# Patient Record
Sex: Male | Born: 1955 | Race: White | Hispanic: No | Marital: Married | Smoking: Never smoker
Health system: Southern US, Community
[De-identification: ages and names within clinical notes are randomized; demographics above are authoritative.]

## PROBLEM LIST (undated history)

## (undated) DIAGNOSIS — I10 Essential (primary) hypertension: Secondary | ICD-10-CM

## (undated) DIAGNOSIS — R079 Chest pain, unspecified: Secondary | ICD-10-CM

## (undated) DIAGNOSIS — I2510 Atherosclerotic heart disease of native coronary artery without angina pectoris: Secondary | ICD-10-CM

## (undated) DIAGNOSIS — M1990 Unspecified osteoarthritis, unspecified site: Secondary | ICD-10-CM

## (undated) DIAGNOSIS — I214 Non-ST elevation (NSTEMI) myocardial infarction: Secondary | ICD-10-CM

## (undated) DIAGNOSIS — K219 Gastro-esophageal reflux disease without esophagitis: Secondary | ICD-10-CM

## (undated) DIAGNOSIS — E7800 Pure hypercholesterolemia, unspecified: Secondary | ICD-10-CM

## (undated) HISTORY — PX: SHOULDER HEMI-ARTHROPLASTY: SHX5049

## (undated) HISTORY — DX: Chest pain, unspecified: R07.9

## (undated) HISTORY — DX: Gastro-esophageal reflux disease without esophagitis: K21.9

## (undated) HISTORY — PX: CARDIAC CATHETERIZATION: SHX172

---

## 2007-08-17 ENCOUNTER — Encounter: Admission: RE | Admit: 2007-08-17 | Discharge: 2007-08-17 | Payer: Self-pay | Admitting: Orthopedic Surgery

## 2010-02-18 ENCOUNTER — Encounter: Payer: Self-pay | Admitting: Orthopedic Surgery

## 2012-03-22 ENCOUNTER — Encounter (HOSPITAL_COMMUNITY): Payer: Self-pay | Admitting: *Deleted

## 2012-03-22 ENCOUNTER — Emergency Department (HOSPITAL_COMMUNITY): Payer: 59

## 2012-03-22 ENCOUNTER — Emergency Department (HOSPITAL_COMMUNITY)
Admission: EM | Admit: 2012-03-22 | Discharge: 2012-03-22 | Disposition: A | Discharge status: Home / Self Care | Payer: 59 | Attending: Emergency Medicine | Admitting: Emergency Medicine

## 2012-03-22 DIAGNOSIS — Z8639 Personal history of other endocrine, nutritional and metabolic disease: Secondary | ICD-10-CM | POA: Insufficient documentation

## 2012-03-22 DIAGNOSIS — Z8739 Personal history of other diseases of the musculoskeletal system and connective tissue: Secondary | ICD-10-CM | POA: Insufficient documentation

## 2012-03-22 DIAGNOSIS — R6889 Other general symptoms and signs: Secondary | ICD-10-CM | POA: Insufficient documentation

## 2012-03-22 DIAGNOSIS — R1310 Dysphagia, unspecified: Secondary | ICD-10-CM | POA: Insufficient documentation

## 2012-03-22 DIAGNOSIS — I10 Essential (primary) hypertension: Secondary | ICD-10-CM | POA: Insufficient documentation

## 2012-03-22 DIAGNOSIS — Z862 Personal history of diseases of the blood and blood-forming organs and certain disorders involving the immune mechanism: Secondary | ICD-10-CM | POA: Insufficient documentation

## 2012-03-22 DIAGNOSIS — R112 Nausea with vomiting, unspecified: Secondary | ICD-10-CM | POA: Insufficient documentation

## 2012-03-22 HISTORY — DX: Essential (primary) hypertension: I10

## 2012-03-22 HISTORY — DX: Pure hypercholesterolemia, unspecified: E78.00

## 2012-03-22 HISTORY — DX: Unspecified osteoarthritis, unspecified site: M19.90

## 2012-03-22 MED ORDER — GI COCKTAIL ~~LOC~~
30.0000 mL | Freq: Once | ORAL | Status: AC
  Administered 2012-03-22: 30 mL via ORAL
  Filled 2012-03-22: qty 30

## 2012-03-22 MED ORDER — GLUCAGON HCL (RDNA) 1 MG IJ SOLR
1.0000 mg | Freq: Once | INTRAMUSCULAR | Status: AC
  Administered 2012-03-22: 1 mg via INTRAMUSCULAR
  Filled 2012-03-22: qty 1

## 2012-03-22 NOTE — ED Notes (Signed)
MD at bedside. 

## 2012-03-22 NOTE — ED Notes (Signed)
Pt able to drink water and eat crackers with no further vomiting reported. Pt reports that he feels better but continues to have a burning sensation in chest from vomiting, Dr. Silverio Lay also aware.

## 2012-03-22 NOTE — ED Provider Notes (Signed)
History     CSN: [id number]  Arrival date & time 03/22/12  0830   First MD Initiated Contact with Patient 03/22/12 [phone]      Chief Complaint  Patient presents with  . Dysphagia  . Emesis    (Consider location/radiation/quality/duration/timing/severity/associated sxs/prior treatment) The history is provided by the patient.  Logan Wells is a 57 y.o. male here with food stuck in esophagus. He was eating toast and eggs and bacon this morning and suddenly felt that some food was stuck in his lower esophagus and then felt nauseous and vomited. Unable to tolerate by mouth afterwards including water. Denies any shortness of breath or wheezing. Denies any abdominal pain. Denies eating anything with bones in it.    Past Medical History  Diagnosis Date  . Hypertension   . High cholesterol   . Arthritis     Past Surgical History  Procedure Laterality Date  . Shoulder surgery      Left    History reviewed. No pertinent family history.  History  Substance Use Topics  . Smoking status: Never Smoker   . Smokeless tobacco: Never Used  . Alcohol Use: 1.2 oz/week    2 Glasses of wine per week     Comment: daily      Review of Systems  Gastrointestinal: Positive for vomiting.  All other systems reviewed and are negative.    Allergies  Review of patient's allergies indicates no known allergies.  Home Medications  No current outpatient prescriptions on file.  BP 165/99  Pulse 82  Resp 16  SpO2 96%  Physical Exam  Nursing note and vitals reviewed. Constitutional: He is oriented to person, place, and time. He appears well-developed and well-nourished.  HENT:  Head: Normocephalic.  Mouth/Throat: Oropharynx is clear and moist.  No visualized foreign body in OP   Eyes: Conjunctivae are normal. Pupils are equal, round, and reactive to light.  Neck: Normal range of motion. Neck supple.  Cardiovascular: Normal rate, regular rhythm and normal heart sounds.    Pulmonary/Chest: Effort normal and breath sounds normal. No respiratory distress. He has no wheezes. He has no rales.  Abdominal: Soft. Bowel sounds are normal. He exhibits no distension. There is no tenderness. There is no rebound.  Musculoskeletal: Normal range of motion.  Neurological: He is alert and oriented to person, place, and time.  Skin: Skin is warm and dry.  Psychiatric: He has a normal mood and affect. His behavior is normal. Judgment and thought content normal.    ED Course  Procedures (including critical care time)  Labs Reviewed - No data to display Dg Chest 2 View  03/22/2012  *RADIOLOGY REPORT*  Clinical Data: Food stuck in esophagus.  CHEST - 2 VIEW  Comparison: 02/04/2008  Findings: Heart and mediastinal contours are within normal limits. No focal opacities or effusions.  No acute bony abnormality.  IMPRESSION: No active cardiopulmonary disease.   Original Report Authenticated By: Charlett Nose, M.D.      No diagnosis found.    MDM  Montre Harbor is a 57 y.o. male here with possible food bolus in esophagus. Will get xray. Will give glucagon IM to try and alleviate symptoms. Will PO trial afterwards.   10:41 AM Felt better after glucagon and GI cocktail. Able to drink fluids and eat crackers. Likely a food bolus that was stuck and now passed into the stomach. D/c home. Return precautions given.         Richardean Canal, MD 03/22/12 [phone]

## 2012-03-22 NOTE — ED Notes (Signed)
Patient transported to X-ray 

## 2012-03-22 NOTE — ED Notes (Signed)
Pt from home with reports of dysphagia that started this morning around 0800. Pt endorses inability to keep food or drink down with pain. Pt reports that it feels as if food gets stuck and will not pass a certain area in upper chest.

## 2012-05-28 ENCOUNTER — Other Ambulatory Visit: Payer: Self-pay | Admitting: Family Medicine

## 2012-05-28 DIAGNOSIS — R1310 Dysphagia, unspecified: Secondary | ICD-10-CM

## 2012-06-04 ENCOUNTER — Ambulatory Visit
Admission: RE | Admit: 2012-06-04 | Discharge: 2012-06-04 | Disposition: A | Discharge status: Home / Self Care | Payer: 59 | Source: Ambulatory Visit | Attending: Family Medicine | Admitting: Family Medicine

## 2012-06-04 DIAGNOSIS — R1310 Dysphagia, unspecified: Secondary | ICD-10-CM

## 2012-11-05 ENCOUNTER — Ambulatory Visit (INDEPENDENT_AMBULATORY_CARE_PROVIDER_SITE_OTHER): Payer: 59

## 2012-11-05 DIAGNOSIS — Z23 Encounter for immunization: Secondary | ICD-10-CM

## 2016-08-26 ENCOUNTER — Ambulatory Visit (INDEPENDENT_AMBULATORY_CARE_PROVIDER_SITE_OTHER): Payer: 59

## 2016-08-26 ENCOUNTER — Ambulatory Visit (INDEPENDENT_AMBULATORY_CARE_PROVIDER_SITE_OTHER): Payer: Self-pay | Admitting: Podiatry

## 2016-08-26 DIAGNOSIS — G5790 Unspecified mononeuropathy of unspecified lower limb: Secondary | ICD-10-CM

## 2016-08-26 DIAGNOSIS — M79671 Pain in right foot: Secondary | ICD-10-CM

## 2016-08-26 DIAGNOSIS — M79672 Pain in left foot: Secondary | ICD-10-CM

## 2016-08-26 NOTE — Progress Notes (Signed)
   Subjective:    Patient ID: Logan Wells, male    DOB: 11/12/1955, 61 y.o.   MRN: [id number]  HPI: He presents today as a chief complaint of pain and burning to the lateral aspect of the foot bilaterally and painful radiating pain along the medial aspect of the foot shooting out the distal aspect of the hallux bilaterally. He states that this is been going on now for the past 2 months. He is an avid walker and gets in 5000 steps first thing in the mornings. He states that he alternates issues daily including his shoes that he wears while he is exercising. He also alternates machines from treadmill to elliptical. He is also noting the only changes in his medical history was increase in his Lipitor approximately 3 months ago. He denies any aches and pains in his muscles and joints.    Review of Systems  All other systems reviewed and are negative.      Objective:   Physical Exam: Vital signs are stable alert and oriented 3. Pulses are palpable. Neurologic sensorium is intact per Semmes-Weinstein monofilaments and deep tendon reflexes are intact bilaterally symmetrical. Muscle strength +5 over 5 dorsiflexion plantar flexors and inverters everters all of his musculature is intact. Orthopedic evaluation of Mr. it's all joints distal to the ankle for range of motion without crepitation. No reproducible pain on palpation. Radiographs do not demonstrate any type of osseus abnormalities.        Assessment & Plan:  Neuritis of unknown etiology. Possibly associated with Lipitor or shoe gear failure.  Plan: I recommended shoe gear change and update. I also recommended if this fails to alleviate his symptoms and I would consider a drug holiday to make sure that he is not having side effects of the Lipitor. Neuropathic symptoms are not often seen with this but I have had one other case with this.

## 2016-11-18 ENCOUNTER — Telehealth: Payer: Self-pay | Admitting: Nurse Practitioner

## 2016-11-18 NOTE — Telephone Encounter (Signed)
Received call from Dr. Sigmund HazelLisa Miller at [facility] for Dr. Elease HashimotoNahser, DOD. She called about a patient currently in her office who is experiencing chest pain with exertion. Per Dr. Elease HashimotoNahser, Dr. Hyacinth MeekerMiller reports EKG is not acute. He has not been seen by cardiology before. Dr. Elease HashimotoNahser advised her to prescribe SL NTG and that patient can be seen by one of our providers tomorrow. I was able to find an opening on Dr. Norris Crossurner's schedule for tomorrow 10/24 for new patient and Dr. Rondel BatonMiller's office states they will give patient the information.

## 2016-11-19 ENCOUNTER — Ambulatory Visit (INDEPENDENT_AMBULATORY_CARE_PROVIDER_SITE_OTHER): Payer: 59 | Admitting: Cardiology

## 2016-11-19 ENCOUNTER — Encounter: Payer: Self-pay | Admitting: Cardiology

## 2016-11-19 DIAGNOSIS — R079 Chest pain, unspecified: Secondary | ICD-10-CM

## 2016-11-19 DIAGNOSIS — K219 Gastro-esophageal reflux disease without esophagitis: Secondary | ICD-10-CM | POA: Insufficient documentation

## 2016-11-19 DIAGNOSIS — I200 Unstable angina: Secondary | ICD-10-CM | POA: Insufficient documentation

## 2016-11-19 HISTORY — DX: Chest pain, unspecified: R07.9

## 2016-11-19 NOTE — Patient Instructions (Addendum)
Your physician recommends that you continue on your current medications as directed. Please refer to the Current Medication list given to you today.  Your physician has requested that you have an echocardiogram. Echocardiography is a painless test that uses sound waves to create images of your heart. It provides your doctor with information about the size and shape of your heart and how well your heart's chambers and valves are working. This procedure takes approximately one hour. There are no restrictions for this procedure.  Your physician has requested that you have an exercise tolerance test. For further information please visit [email or web address]. Please also follow instruction sheet, as given.  Your physician recommends that you schedule a follow-up appointment in: as needed with Dr. Mayford Knifeurner.

## 2016-11-19 NOTE — Progress Notes (Deleted)
  Cardiology Office Note:    Date:  11/19/2016   ID:  Logan Wells, DOB 08-12-1955, MRN [id number]  PCP:  Sigmund HazelMiller, Lisa, MD  Cardiologist:  Armanda Magicraci Turner, MD   Referring MD: Sigmund HazelMiller, Lisa, MD   No chief complaint on file.   History of Present Illness:    Logan Wells is a 61 y.o. male with a hx of ***  Past Medical History:  Diagnosis Date  . Arthritis   . High cholesterol   . Hypertension     Past Surgical History:  Procedure Laterality Date  . SHOULDER SURGERY     Left    Current Medications: No outpatient prescriptions have been marked as taking for the 11/19/16 encounter (Appointment) with Quintella Reicherturner, Traci R, MD.     Allergies:   Patient has no known allergies.   Social History   Social History  . Marital status: Unknown    Spouse name: N/A  . Number of children: N/A  . Years of education: N/A   Social History Main Topics  . Smoking status: Never Smoker  . Smokeless tobacco: Never Used  . Alcohol use 1.2 oz/week    2 Glasses of wine per week     Comment: daily  . Drug use: No  . Sexual activity: Not on file   Other Topics Concern  . Not on file   Social History Narrative  . No narrative on file     Family History: The patient's ***family history is not on file.  ROS:   Please see the history of present illness.    ROS  All other systems reviewed and negative.   EKGs/Labs/Other Studies Reviewed:    The following studies were reviewed today: ***  EKG:  EKG is *** ordered today.  The ekg ordered today demonstrates ***  Recent Labs: No results found for requested labs within last 8760 hours.   Recent Lipid Panel No results found for: CHOL, TRIG, HDL, CHOLHDL, VLDL, LDLCALC, LDLDIRECT  Physical Exam:    VS:  There were no vitals taken for this visit.    Wt Readings from Last 3 Encounters:  No data found for Wt     GEN: *** Well nourished, well developed in no acute distress HEENT: Normal NECK: No JVD; No carotid bruits LYMPHATICS: No  lymphadenopathy CARDIAC: ***RRR, no murmurs, rubs, gallops RESPIRATORY:  Clear to auscultation without rales, wheezing or rhonchi  ABDOMEN: Soft, non-tender, non-distended MUSCULOSKELETAL:  No edema; No deformity  SKIN: Warm and dry NEUROLOGIC:  Alert and oriented x 3 PSYCHIATRIC:  Normal affect   ASSESSMENT:    No diagnosis found. PLAN:    In order of problems listed above:  ***   Medication Adjustments/Labs and Tests Ordered: Current medicines are reviewed at length with the patient today.  Concerns regarding medicines are outlined above.  No orders of the defined types were placed in this encounter.  No orders of the defined types were placed in this encounter.   Signed, Armanda Magicraci Turner, MD  11/19/2016 8:30 AM    [facility]

## 2016-11-19 NOTE — Progress Notes (Signed)
Cardiology Office Note    Date:  11/19/2016   ID:  Logan Wells, DOB 02/21/1955, MRN [id number]  PCP:  Kathyrn Lass, MD  Cardiologist:  Fransico Him, MD   Chief Complaint  Patient presents with  . Chest Pain    History of Present Illness:  Logan Wells is a 61 y.o. male who is being seen today for the evaluation of Chest pain at the request of Kathyrn Lass, MD.    This is a 61yo male with a history of hyperlipidemia, HTN and GERD who presents today for evaluation of CP.  He is very active and works out at least 5 days weekly.  In the past he has never had any problems but recently he has had problems with chest pain when walking on the treadmill.  He went to [place] this weekend and went hiking and had onset of CP again that resolved with rest.  The pain is midsternal with no radiation and is not associated with nausea or diaphoresis.  He denies any SOB, DOE, LE edema, palpitations or syncope.   Past Medical History:  Diagnosis Date  . Arthritis   . Chest pain 11/19/2016  . GERD (gastroesophageal reflux disease)   . High cholesterol   . Hypertension     Past Surgical History:  Procedure Laterality Date  . SHOULDER SURGERY     Left    Current Medications: Current Meds  Medication Sig  . esomeprazole (NEXIUM) 10 MG packet Take 10 mg by mouth daily before breakfast.  . losartan (COZAAR) 50 MG tablet Take 50 mg by mouth.  . nitroGLYCERIN (NITROSTAT) 0.4 MG SL tablet Place 0.4 mg under the tongue every 5 (five) minutes as needed for chest pain.  . simvastatin (ZOCOR) 40 MG tablet Take 40 mg by mouth daily at 6 PM.     Allergies:   Patient has no known allergies.   Social History   Social History  . Marital status: Unknown    Spouse name: N/A  . Number of children: N/A  . Years of education: N/A   Social History Main Topics  . Smoking status: Never Smoker  . Smokeless tobacco: Never Used  . Alcohol use 1.2 oz/week    2 Glasses of wine per week     Comment:  daily  . Drug use: No  . Sexual activity: Not Asked   Other Topics Concern  . None   Social History Narrative  . None     Family History:  The patient's family history includes CAD in his mother; Heart attack (age of onset: 41) in his mother; Heart disease in his mother; Multiple myeloma in his father.   ROS:   Please see the history of present illness.    ROS All other systems reviewed and are negative.  No flowsheet data found.     PHYSICAL EXAM:   VS:  BP 140/88   Pulse 87   Ht 5' 10"  (1.778 m)   Wt 178 lb (80.7 kg)   SpO2 96%   BMI 25.54 kg/m    GEN: Well nourished, well developed, in no acute distress  HEENT: normal  Neck: no JVD, carotid bruits, or masses Cardiac:RRR; no murmurs, rubs, or gallops,no edema.  Intact distal pulses bilaterally.  Respiratory:  clear to auscultation bilaterally, normal work of breathing GI: soft, nontender, nondistended, + BS MS: no deformity or atrophy  Skin: warm and dry, no rash Neuro:  Alert and Oriented x 3, Strength and sensation are intact  Psych: euthymic mood, full affect  Wt Readings from Last 3 Encounters:  11/19/16 178 lb (80.7 kg)      Studies/Labs Reviewed:   EKG:  EKG isnot ordered today.    Recent Labs: No results found for requested labs within last 8760 hours.   Lipid Panel No results found for: CHOL, TRIG, HDL, CHOLHDL, VLDL, LDLCALC, LDLDIRECT  Additional studies/ records that were reviewed today include:  Office notes from PCP    ASSESSMENT:    1. Chest pain, unspecified type      PLAN:  In order of problems listed above:  1. Chest pain - I am concerned that this may be related to CAD.  He has been able to do the same workout at the gym for years and now is having chest pain on the treadmill.  He has a family history of premature CAD and also has HTN and hyperlipidemia.  I have recommended that we proceed with ETT to rule out ischemia and 2D echo to assess LVF.     Medication  Adjustments/Labs and Tests Ordered: Current medicines are reviewed at length with the patient today.  Concerns regarding medicines are outlined above.  Medication changes, Labs and Tests ordered today are listed in the Patient Instructions below.  Patient Instructions  Your physician recommends that you continue on your current medications as directed. Please refer to the Current Medication list given to you today.  Your physician has requested that you have an echocardiogram. Echocardiography is a painless test that uses sound waves to create images of your heart. It provides your doctor with information about the size and shape of your heart and how well your heart's chambers and valves are working. This procedure takes approximately one hour. There are no restrictions for this procedure.  Your physician has requested that you have an exercise tolerance test. For further information please visit [email or web address]. Please also follow instruction sheet, as given.      Signed, Fransico Him, MD  11/19/2016 8:53 AM    [address], [place], [place]  [address] Phone: [phone]; Fax: [phone]

## 2016-11-26 ENCOUNTER — Telehealth: Payer: Self-pay | Admitting: Cardiology

## 2016-11-26 ENCOUNTER — Ambulatory Visit (INDEPENDENT_AMBULATORY_CARE_PROVIDER_SITE_OTHER): Payer: 59

## 2016-11-26 ENCOUNTER — Other Ambulatory Visit: Payer: Self-pay

## 2016-11-26 ENCOUNTER — Ambulatory Visit (HOSPITAL_COMMUNITY): Payer: 59 | Attending: Cardiovascular Disease

## 2016-11-26 DIAGNOSIS — R079 Chest pain, unspecified: Secondary | ICD-10-CM

## 2016-11-26 DIAGNOSIS — E785 Hyperlipidemia, unspecified: Secondary | ICD-10-CM | POA: Insufficient documentation

## 2016-11-26 DIAGNOSIS — I10 Essential (primary) hypertension: Secondary | ICD-10-CM | POA: Insufficient documentation

## 2016-11-26 LAB — EXERCISE TOLERANCE TEST
CHL RATE OF PERCEIVED EXERTION: 15
CSEPED: 8 min
CSEPEDS: 0 s
CSEPEW: 10.1 METS
MPHR: 161 {beats}/min
Peak HR: 151 {beats}/min
Percent HR: 93 %
Rest HR: 72 {beats}/min

## 2016-11-26 NOTE — Telephone Encounter (Signed)
Spoke to patient stated he was returning a call to [place].Advised I will send message to her.

## 2016-11-26 NOTE — Telephone Encounter (Signed)
New Message ° ° pt verbalized that he is returning call for rn  °

## 2016-11-26 NOTE — Telephone Encounter (Signed)
-----   Message from Quintella Reichertraci R Turner, MD sent at 11/26/2016  3:36 PM EDT ----- His LV global strain is mildly abnormal which could indicate very mild LV dysfunction.  Please change coronary calcium score to coronary CTA with morphology to rule out CAD

## 2016-11-26 NOTE — Telephone Encounter (Signed)
Patient made aware of results. Coronary CT ordered for scheduling. Patient verbalizes understanding.

## 2016-12-02 ENCOUNTER — Encounter: Payer: Self-pay | Admitting: Cardiology

## 2016-12-16 ENCOUNTER — Encounter: Payer: Self-pay | Admitting: Cardiology

## 2016-12-17 ENCOUNTER — Telehealth: Payer: Self-pay

## 2016-12-17 DIAGNOSIS — R079 Chest pain, unspecified: Secondary | ICD-10-CM

## 2016-12-17 NOTE — Progress Notes (Signed)
Letter for coronary CT scan.

## 2016-12-17 NOTE — Telephone Encounter (Signed)
Left message to call back to go over instructions for CT scan and to order BMET to be done prior to scan.

## 2016-12-22 MED ORDER — METOPROLOL TARTRATE 50 MG PO TABS: 50.0000 mg | ORAL_TABLET | Freq: Once | ORAL | 0 refills | Status: DC

## 2016-12-22 NOTE — Telephone Encounter (Signed)
Reviewed CT instructions with patient, patient is scheduled for BMET tomororw 11/27 and instructed patient to take one time dose of lopressor 50 mg 1 hour before the test. Patient in agreement with plan and thanked me for the call. Instructions placed up front to be picked up when he comes in for lab work. Patient verbalized understanding and thanked me for the call.

## 2016-12-22 NOTE — Telephone Encounter (Signed)
F/U Call: ° °Patient returning call for results. °

## 2016-12-22 NOTE — Telephone Encounter (Signed)
Left message to call back to discuss CT scan instructions.

## 2016-12-23 ENCOUNTER — Encounter: Payer: Self-pay | Admitting: Cardiology

## 2016-12-23 ENCOUNTER — Other Ambulatory Visit: Payer: 59

## 2016-12-23 DIAGNOSIS — R079 Chest pain, unspecified: Secondary | ICD-10-CM

## 2016-12-24 LAB — BASIC METABOLIC PANEL
BUN/Creatinine Ratio: 15 (ref 9–20)
BUN: 13 mg/dL (ref 6–24)
CALCIUM: 9.5 mg/dL (ref 8.7–10.2)
CHLORIDE: 100 mmol/L (ref 96–106)
CO2: 26 mmol/L (ref 20–29)
Creatinine, Ser: 0.89 mg/dL (ref 0.76–1.27)
GFR, EST AFRICAN AMERICAN: 108 mL/min/{1.73_m2} (ref >59)
GFR, EST NON AFRICAN AMERICAN: 94 mL/min/{1.73_m2} (ref >59)
Glucose: 99 mg/dL (ref 65–99)
POTASSIUM: 4.1 mmol/L (ref 3.5–5.2)
Sodium: 143 mmol/L (ref 134–144)

## 2016-12-27 DIAGNOSIS — I2510 Atherosclerotic heart disease of native coronary artery without angina pectoris: Secondary | ICD-10-CM

## 2016-12-27 HISTORY — DX: Atherosclerotic heart disease of native coronary artery without angina pectoris: I25.10

## 2016-12-31 ENCOUNTER — Ambulatory Visit (HOSPITAL_COMMUNITY)
Admission: RE | Admit: 2016-12-31 | Discharge: 2016-12-31 | Disposition: A | Discharge status: Home / Self Care | Payer: 59 | Source: Ambulatory Visit | Attending: Cardiology | Admitting: Cardiology

## 2016-12-31 DIAGNOSIS — R079 Chest pain, unspecified: Secondary | ICD-10-CM | POA: Insufficient documentation

## 2016-12-31 IMAGING — CT CT HEART MORP W/ CTA COR W/ SCORE W/ CA W/CM &/OR W/O CM
4 of 7 series · 8 of 20 positions shown, 9 images · IV contrast (APPLIED)
Comparison: None.

CLINICAL DATA: Chest pain

EXAM:
Cardiac CTA
MEDICATIONS:
Sub lingual nitro. 4mg and lopressor 5mg IV
TECHNIQUE: The patient was scanned on a Siemens [REDACTED]ice scanner. Gantry
rotation speed was 240 msecs. Collimation was 0.6 mm. A 100 kV
prospective scan was triggered in the ascending thoracic aorta at
35-75% of the R-R interval. Average HR during the scan was 60 bpm.
The 3D data set was interpreted on a dedicated work station using
MPR, MIP and VRT modes. A total of 80cc of contrast was used.

[Series 6: best diast 72 % · axial · 0.34mm/px · z∈[+935,+987]mm · 2 of 388 slices shown, 3 images]
[im 130/388  vessel]
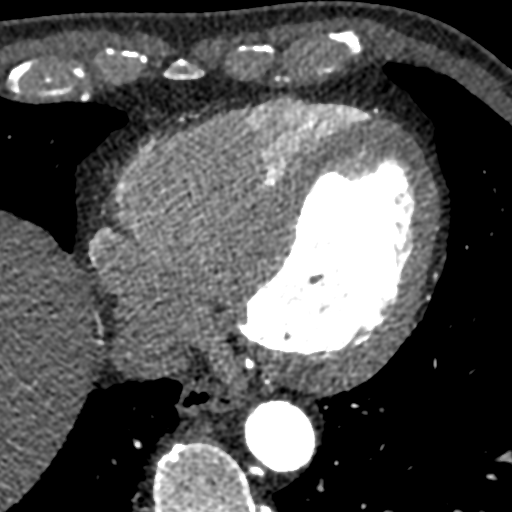
[im 130/388  lung]
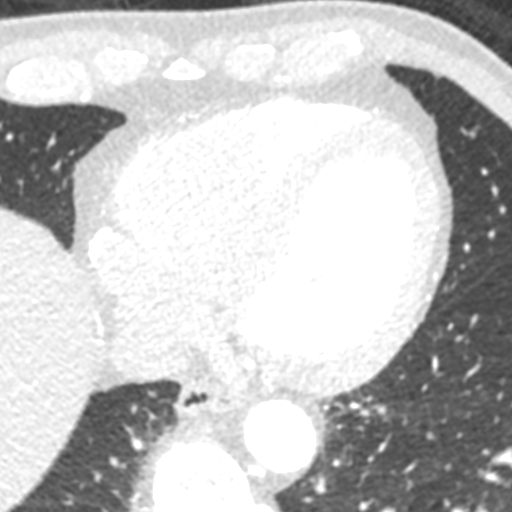
[im 259/388  vessel]
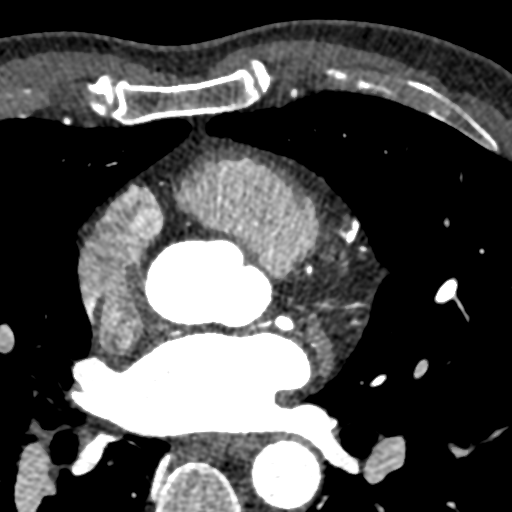

[Series 7: best syst 41 % · axial · 0.34mm/px · z∈[+935,+987]mm · 2 of 388 slices shown]
[im 130/388  vessel]
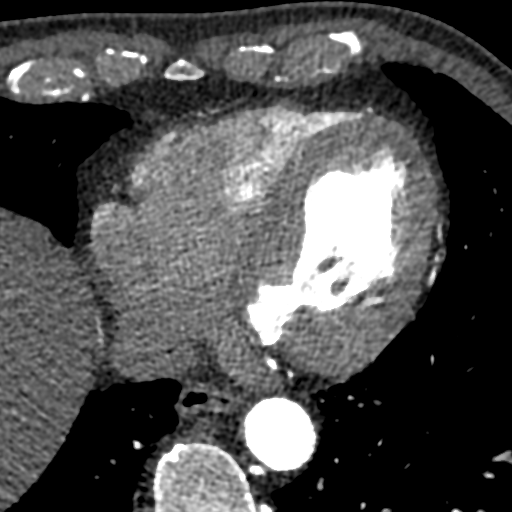
[im 259/388  vessel]
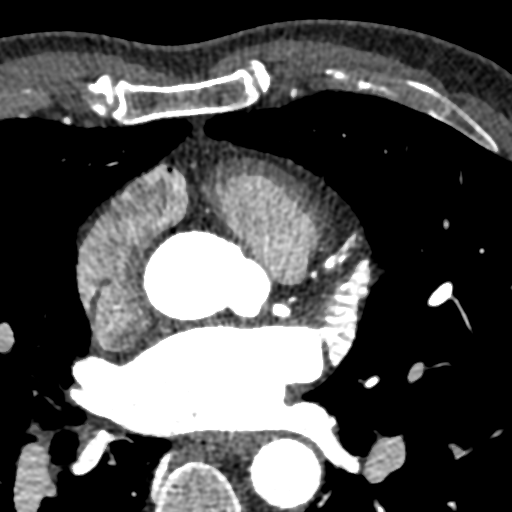

[Series 8: ts diast sharp 72 % · axial · 0.34mm/px · z∈[+935,+987]mm · 2 of 388 slices shown]
[im 130/388  lung]
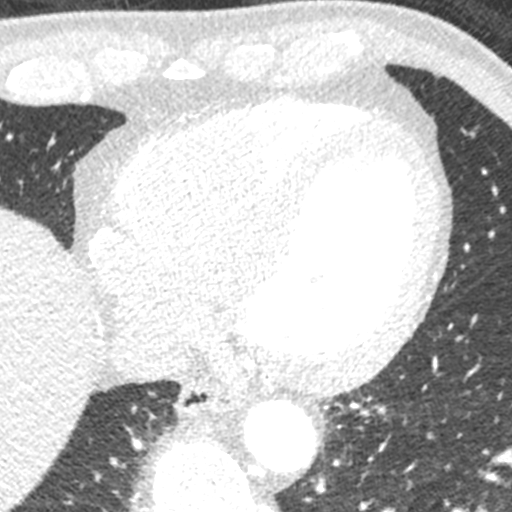
[im 259/388  lung]
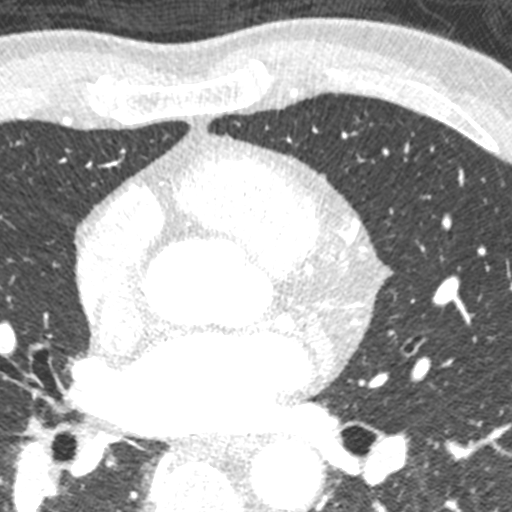

[Series 9: ts syst sharp 41 % · axial · 0.34mm/px · z∈[+935,+987]mm · 2 of 388 slices shown]
[im 130/388  lung]
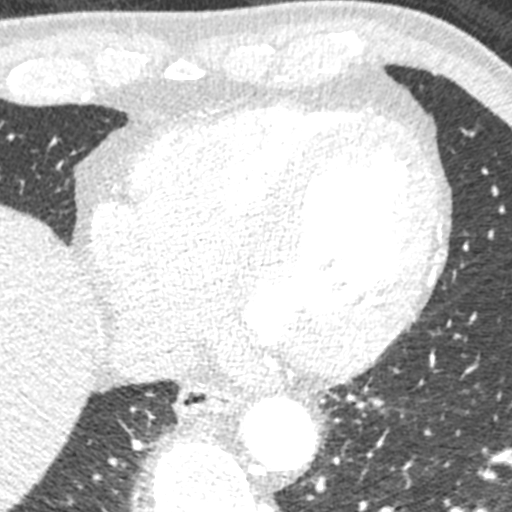
[im 259/388  lung]
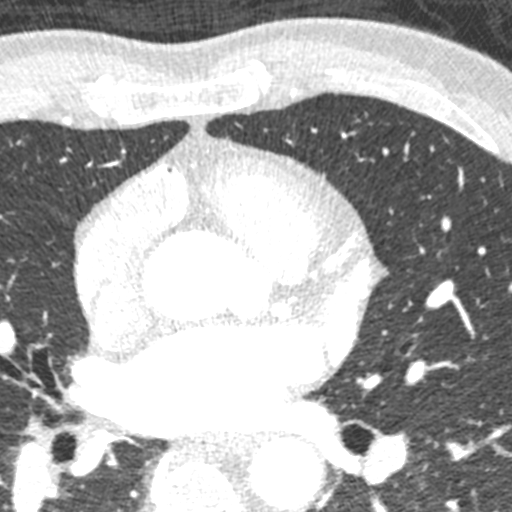

[8 of 20 positions shown; findings below may reference images not displayed]

FINDINGS: Non-cardiac: See separate report from [REDACTED].

Calcium Score: Coronary artery calcium score of 1553 Agatston units.

Coronary Arteries: Left dominant with no anomalies

LM:  Calcified plaque distal left main, mild stenosis.

LAD system: Extensive mixed plaque in the proximal and mid LAD. I
suspect moderate to severe stenosis.

Circumflex system: Small ramus, no significant disease. Mixed plaque
with around 50% stenosis proximal LCx proximal to OM1. Mixed plaque
with mild stenosis proximal OM1. Mixed plaque with mild stenosis mid
LCx. The LCx ends in a left-sided PDA. This vessel is diffusely
diseased, suspect severe stenosis.

RCA system: Nondominant vessel. Mixed plaque with moderate stenosis
in the proximal RCA.
IMPRESSION: 1. Coronary artery calcium score 1553 Agatston units, placing the
patient in the 99th percentile for age and gender and suggesting
high risk for future cardiac events.

2. Suspect severe stenosis in the proximal to mid LAD and the left
PDA. Possible moderate stenosis in the non-dominant RCA.

I think this patient needs catheterization. I will send for CT FFR
to confirm.

Fallon Jim

EXAM:
OVER-READ INTERPRETATION  CT CHEST

The following report is an over-read performed by radiologist Dr.
Maaniu Asuik [REDACTED] on 12/31/2016. This over-read
does not include interpretation of cardiac or coronary anatomy or
pathology. The coronary CTA interpretation by the cardiologist is
attached.
FINDINGS: Vascular: Heart is normal size.  Visualized aorta normal caliber.

Mediastinum/Nodes: No adenopathy in the lower mediastinum or hila.

Lungs/Pleura: Visualized lungs are clear.  No effusions.

Upper Abdomen: Imaging into the upper abdomen shows no acute
findings.

Musculoskeletal: Chest wall soft tissues are unremarkable. No acute
bony abnormality.
IMPRESSION: No acute or significant extracardiac abnormality.

## 2016-12-31 MED ORDER — METOPROLOL TARTRATE 5 MG/5ML IV SOLN
INTRAVENOUS | Status: AC
  Filled 2016-12-31: qty 15

## 2016-12-31 MED ORDER — IOPAMIDOL (ISOVUE-370) INJECTION 76%
INTRAVENOUS | Status: AC
  Administered 2016-12-31: 80 mL via INTRAVENOUS
  Filled 2016-12-31: qty 100

## 2016-12-31 MED ORDER — METOPROLOL TARTRATE 5 MG/5ML IV SOLN
5.0000 mg | INTRAVENOUS | Status: DC | PRN
  Administered 2016-12-31: 5 mg via INTRAVENOUS

## 2016-12-31 MED ORDER — NITROGLYCERIN 0.4 MG SL SUBL
0.4000 mg | SUBLINGUAL_TABLET | Freq: Once | SUBLINGUAL | Status: AC
Start: 2016-12-31 — End: 2016-12-31
  Administered 2016-12-31: 0.4 mg via SUBLINGUAL

## 2016-12-31 MED ORDER — NITROGLYCERIN 0.4 MG SL SUBL
SUBLINGUAL_TABLET | SUBLINGUAL | Status: AC
  Filled 2016-12-31: qty 1

## 2016-12-31 NOTE — Progress Notes (Signed)
Normal labs.

## 2016-12-31 NOTE — Progress Notes (Signed)
CT completed. Tolerated well. D/C home with wife. Awake and alert. In no distress.

## 2017-01-01 DIAGNOSIS — R079 Chest pain, unspecified: Secondary | ICD-10-CM | POA: Diagnosis not present

## 2017-01-04 ENCOUNTER — Inpatient Hospital Stay (HOSPITAL_COMMUNITY)
Admission: EM | Admit: 2017-01-04 | Discharge: 2017-01-10 | DRG: 234 | Disposition: A | Discharge status: Home / Self Care | Payer: 59 | Attending: Cardiothoracic Surgery | Admitting: Cardiothoracic Surgery

## 2017-01-04 ENCOUNTER — Encounter (HOSPITAL_COMMUNITY): Payer: Self-pay | Admitting: Emergency Medicine

## 2017-01-04 ENCOUNTER — Emergency Department (HOSPITAL_COMMUNITY): Payer: 59

## 2017-01-04 ENCOUNTER — Other Ambulatory Visit: Payer: Self-pay

## 2017-01-04 DIAGNOSIS — E7800 Pure hypercholesterolemia, unspecified: Secondary | ICD-10-CM | POA: Diagnosis present

## 2017-01-04 DIAGNOSIS — I214 Non-ST elevation (NSTEMI) myocardial infarction: Secondary | ICD-10-CM | POA: Diagnosis present

## 2017-01-04 DIAGNOSIS — M1990 Unspecified osteoarthritis, unspecified site: Secondary | ICD-10-CM | POA: Diagnosis present

## 2017-01-04 DIAGNOSIS — K219 Gastro-esophageal reflux disease without esophagitis: Secondary | ICD-10-CM | POA: Diagnosis present

## 2017-01-04 DIAGNOSIS — I10 Essential (primary) hypertension: Secondary | ICD-10-CM | POA: Diagnosis present

## 2017-01-04 DIAGNOSIS — I2510 Atherosclerotic heart disease of native coronary artery without angina pectoris: Secondary | ICD-10-CM | POA: Diagnosis not present

## 2017-01-04 DIAGNOSIS — D62 Acute posthemorrhagic anemia: Secondary | ICD-10-CM | POA: Diagnosis not present

## 2017-01-04 DIAGNOSIS — D696 Thrombocytopenia, unspecified: Secondary | ICD-10-CM | POA: Diagnosis not present

## 2017-01-04 DIAGNOSIS — Z8249 Family history of ischemic heart disease and other diseases of the circulatory system: Secondary | ICD-10-CM | POA: Diagnosis not present

## 2017-01-04 DIAGNOSIS — E785 Hyperlipidemia, unspecified: Secondary | ICD-10-CM | POA: Diagnosis present

## 2017-01-04 DIAGNOSIS — Z01818 Encounter for other preprocedural examination: Secondary | ICD-10-CM

## 2017-01-04 DIAGNOSIS — R079 Chest pain, unspecified: Secondary | ICD-10-CM

## 2017-01-04 DIAGNOSIS — I2584 Coronary atherosclerosis due to calcified coronary lesion: Secondary | ICD-10-CM

## 2017-01-04 DIAGNOSIS — Z951 Presence of aortocoronary bypass graft: Secondary | ICD-10-CM

## 2017-01-04 DIAGNOSIS — I200 Unstable angina: Secondary | ICD-10-CM

## 2017-01-04 DIAGNOSIS — J95811 Postprocedural pneumothorax: Secondary | ICD-10-CM | POA: Diagnosis not present

## 2017-01-04 DIAGNOSIS — I25110 Atherosclerotic heart disease of native coronary artery with unstable angina pectoris: Secondary | ICD-10-CM | POA: Diagnosis not present

## 2017-01-04 DIAGNOSIS — Z79899 Other long term (current) drug therapy: Secondary | ICD-10-CM | POA: Diagnosis not present

## 2017-01-04 DIAGNOSIS — E8770 Fluid overload, unspecified: Secondary | ICD-10-CM | POA: Diagnosis present

## 2017-01-04 DIAGNOSIS — Z09 Encounter for follow-up examination after completed treatment for conditions other than malignant neoplasm: Secondary | ICD-10-CM

## 2017-01-04 DIAGNOSIS — J939 Pneumothorax, unspecified: Secondary | ICD-10-CM

## 2017-01-04 LAB — BASIC METABOLIC PANEL
ANION GAP: 7 (ref 5–15)
BUN: 12 mg/dL (ref 6–20)
CO2: 28 mmol/L (ref 22–32)
Calcium: 9 mg/dL (ref 8.9–10.3)
Chloride: 104 mmol/L (ref 101–111)
Creatinine, Ser: 0.9 mg/dL (ref 0.61–1.24)
GFR calc Af Amer: >60 mL/min (ref >60)
GFR calc non Af Amer: >60 mL/min (ref >60)
GLUCOSE: 118 mg/dL — AB (ref 65–99)
POTASSIUM: 3.8 mmol/L (ref 3.5–5.1)
Sodium: 139 mmol/L (ref 135–145)

## 2017-01-04 LAB — CBC
HEMATOCRIT: 47.2 % (ref 39.0–52.0)
HEMOGLOBIN: 16.3 g/dL (ref 13.0–17.0)
MCH: 32.6 pg (ref 26.0–34.0)
MCHC: 34.5 g/dL (ref 30.0–36.0)
MCV: 94.4 fL (ref 78.0–100.0)
Platelets: 246 10*3/uL (ref 150–400)
RBC: 5 MIL/uL (ref 4.22–5.81)
RDW: 12.3 % (ref 11.5–15.5)
WBC: 6.7 10*3/uL (ref 4.0–10.5)

## 2017-01-04 LAB — I-STAT TROPONIN, ED: TROPONIN I, POC: 0.04 ng/mL (ref 0.00–0.08)

## 2017-01-04 LAB — HEMOGLOBIN A1C
Hgb A1c MFr Bld: 5.6 % (ref 4.8–5.6)
Mean Plasma Glucose: 114.02 mg/dL

## 2017-01-04 LAB — PROTIME-INR
INR: 1.09
PROTHROMBIN TIME: 14 s (ref 11.4–15.2)

## 2017-01-04 LAB — TROPONIN I: TROPONIN I: 0.32 ng/mL — AB (ref <0.03)

## 2017-01-04 LAB — HEPARIN LEVEL (UNFRACTIONATED): HEPARIN UNFRACTIONATED: 0.36 [IU]/mL (ref 0.30–0.70)

## 2017-01-04 LAB — APTT: APTT: 29 s (ref 24–36)

## 2017-01-04 LAB — MRSA PCR SCREENING: MRSA BY PCR: NEGATIVE

## 2017-01-04 MED ORDER — ACETAMINOPHEN 325 MG PO TABS
650.0000 mg | ORAL_TABLET | ORAL | Status: DC | PRN
  Administered 2017-01-04 – 2017-01-05 (×5): 650 mg via ORAL
  Filled 2017-01-04 (×5): qty 2

## 2017-01-04 MED ORDER — NITROGLYCERIN IN D5W 200-5 MCG/ML-% IV SOLN
0.0000 ug/min | Freq: Once | INTRAVENOUS | Status: AC
  Administered 2017-01-04: 5 ug/min via INTRAVENOUS
  Filled 2017-01-04: qty 250

## 2017-01-04 MED ORDER — NITROGLYCERIN 0.4 MG SL SUBL
0.4000 mg | SUBLINGUAL_TABLET | SUBLINGUAL | Status: DC | PRN
  Administered 2017-01-04 (×3): 0.4 mg via SUBLINGUAL
  Filled 2017-01-04: qty 1

## 2017-01-04 MED ORDER — NITROGLYCERIN IN D5W 200-5 MCG/ML-% IV SOLN: 0.0000 ug/min | Freq: Once | INTRAVENOUS | Status: DC

## 2017-01-04 MED ORDER — METOPROLOL TARTRATE 25 MG PO TABS
25.0000 mg | ORAL_TABLET | Freq: Two times a day (BID) | ORAL | Status: DC
  Administered 2017-01-04 – 2017-01-05 (×3): 25 mg via ORAL
  Filled 2017-01-04 (×3): qty 1

## 2017-01-04 MED ORDER — ALPRAZOLAM 0.25 MG PO TABS: 0.2500 mg | ORAL_TABLET | Freq: Two times a day (BID) | ORAL | Status: DC | PRN

## 2017-01-04 MED ORDER — ALUM & MAG HYDROXIDE-SIMETH 200-200-20 MG/5ML PO SUSP: 15.0000 mL | Freq: Four times a day (QID) | ORAL | Status: DC | PRN

## 2017-01-04 MED ORDER — METOPROLOL TARTRATE 25 MG/10 ML ORAL SUSPENSION: 12.5000 mg | Freq: Two times a day (BID) | ORAL | Status: DC

## 2017-01-04 MED ORDER — SUCRALFATE 1 GM/10ML PO SUSP
1.0000 g | Freq: Once | ORAL | Status: AC
  Administered 2017-01-04: 1 g via ORAL
  Filled 2017-01-04: qty 10

## 2017-01-04 MED ORDER — PANTOPRAZOLE SODIUM 40 MG PO TBEC
40.0000 mg | DELAYED_RELEASE_TABLET | Freq: Every day | ORAL | Status: DC
  Administered 2017-01-05 – 2017-01-06 (×2): 40 mg via ORAL
  Filled 2017-01-04 (×2): qty 1

## 2017-01-04 MED ORDER — CALCIUM CARBONATE ANTACID 500 MG PO CHEW: 1.0000 | CHEWABLE_TABLET | Freq: Every day | ORAL | Status: DC | PRN

## 2017-01-04 MED ORDER — SODIUM CHLORIDE 0.9 % IV SOLN: 250.0000 mL | INTRAVENOUS | Status: DC | PRN

## 2017-01-04 MED ORDER — ASPIRIN EC 81 MG PO TBEC: 81.0000 mg | DELAYED_RELEASE_TABLET | Freq: Every day | ORAL | Status: DC

## 2017-01-04 MED ORDER — ATORVASTATIN CALCIUM 80 MG PO TABS
80.0000 mg | ORAL_TABLET | Freq: Every day | ORAL | Status: DC
  Administered 2017-01-05 – 2017-01-08 (×4): 80 mg via ORAL
  Filled 2017-01-04 (×4): qty 1

## 2017-01-04 MED ORDER — HEPARIN (PORCINE) IN NACL 100-0.45 UNIT/ML-% IJ SOLN
1000.0000 [IU]/h | INTRAMUSCULAR | Status: DC
  Administered 2017-01-04: 950 [IU]/h via INTRAVENOUS
  Filled 2017-01-04: qty 250

## 2017-01-04 MED ORDER — LOSARTAN POTASSIUM 50 MG PO TABS
50.0000 mg | ORAL_TABLET | Freq: Every day | ORAL | Status: DC
  Administered 2017-01-05: 50 mg via ORAL
  Filled 2017-01-04 (×2): qty 1

## 2017-01-04 MED ORDER — SIMVASTATIN 40 MG PO TABS: 40.0000 mg | ORAL_TABLET | Freq: Every day | ORAL | Status: DC

## 2017-01-04 MED ORDER — ONDANSETRON HCL 4 MG/2ML IJ SOLN
4.0000 mg | Freq: Four times a day (QID) | INTRAMUSCULAR | Status: DC | PRN
  Administered 2017-01-06: 4 mg via INTRAVENOUS

## 2017-01-04 MED ORDER — ASPIRIN 81 MG PO CHEW
324.0000 mg | CHEWABLE_TABLET | Freq: Once | ORAL | Status: AC
  Administered 2017-01-04: 324 mg via ORAL
  Filled 2017-01-04: qty 4

## 2017-01-04 MED ORDER — ZOLPIDEM TARTRATE 5 MG PO TABS
5.0000 mg | ORAL_TABLET | Freq: Every evening | ORAL | Status: DC | PRN
  Administered 2017-01-04: 5 mg via ORAL
  Filled 2017-01-04: qty 1

## 2017-01-04 MED ORDER — HEPARIN BOLUS VIA INFUSION
4000.0000 [IU] | Freq: Once | INTRAVENOUS | Status: AC
  Administered 2017-01-04: 4000 [IU] via INTRAVENOUS
  Filled 2017-01-04: qty 4000

## 2017-01-04 NOTE — Progress Notes (Signed)
ANTICOAGULATION CONSULT NOTE - Initial Consult  Pharmacy Consult for Heparin Indication: chest pain/ACS  No Known Allergies  Patient Measurements: Height: 5\' 10"  (177.8 cm) Weight: 170 lb (77.1 kg) IBW/kg (Calculated) : 73 Heparin Dosing Weight: actual body weight  Vital Signs: BP: 110/84 (12/09 1230) Pulse Rate: 73 (12/09 1230)  Labs: Recent Labs    01/04/17 1115  HGB 16.3  HCT 47.2  PLT 246  CREATININE 0.90    Estimated Creatinine Clearance: 91.3 mL/min (by C-G formula based on SCr of 0.9 mg/dL).   Medical History: Past Medical History:  Diagnosis Date  . Arthritis   . Chest pain 11/19/2016  . GERD (gastroesophageal reflux disease)   . High cholesterol   . Hypertension     Medications:  No oral anticoagulation PTA  Assessment:  [age] yr male presents with chest pain and pain radiating down arm  PMH significant for HTN, HLD and GERD.  Has had chest pain symptoms x 1 month  Pharmacy consulted to dose IV heparin  Goal of Therapy:  Heparin level 0.3-0.7 units/ml Monitor platelets by anticoagulation protocol: Yes   Plan:   Obtain baseline aPTT and PT/INR  Heparin 4000 unit IV bolus x 1 then heparin infusion @ 950 units/hr  Check heparin level 6 hr after heparin started  Follow heparin level & CBC daily  Logan Wells, Joselyn GlassmanLeann Trefz, PharmD 01/04/2017,1:03 PM

## 2017-01-04 NOTE — ED Triage Notes (Signed)
Pt complaint of on and off central chest pain with associated pain down arms for a few months worsening this am; has been seen by cardiology for same.

## 2017-01-04 NOTE — H&P (Signed)
CARDIOLOGY HISTORY & PHYSICAL   Referring Physician: Alferd Apa, PA Primary Physician: Kathyrn Lass Primary Cardiologist: Fransico Him Reason for Admission: unstable angina   HPI: Mr. Goyer is a 61 yo man with PMH hyperlipidemia, hypertension, and GERD who presents with increasing frequency of chest discomfort with activity. He reports that beginning in mid-October, he noticed substernal chest burning with exertion. Initially it came on only with heavy exertion, such as hiking in the mountains. He is very active at baseline and had been able to exercise 30 minutes on treadmill or ellipitical without difficulty. However, the discomfort has progressed to the point that he can only walk about 50 yards before needing to stop because of chest discomfort. The pain is associated with shortness of breath. It is primarily substernal but sometimes radiates down bilateral arms. It usually resolved in less than 5 minutes after resting but occasionally lasted to ~20 minutes. Not improved with antacids. He had never had other associated symptoms until today, when he had an episode with nausea, lightheadedness, and diaphoresis. He was sitting at the table, at rest, earlier this AM when he had a severe episode that lasted 20-30 minutes. He came to the ER after this episode. He had never taken a nitroglycerin before except for recent CTA and today in the ER.   He saw Dr. Radford Pax when his symptoms were more mild on 11/19/16. He had a treadmill stress on 11/26/16 with a low risk Duke treadmill score (exercise time 8 minutes, estimated 10 METs, no symptoms, upsloping ST depressions, peak HR 151). Echo done on the same day showed normal EF without WMA but mildly abnormal global longitudinal strain. He then underwent coronary CTA 12/31/16 which showed a coronary calcium score of 1563 Agatson units (99th percentile), calcified distal LM plaque with mild stenosis, extensive proximal to mid LAD plaque with suspected  moderate to severe stenosis, mixed plaque in proximal LCx with 50% stenosis, left PDA with diffuse plaque and suspected severe stenosis, RCA nondominant with mixed plaque and moderate stenosis. CT FFR was <0.5 in mid LAD, suggesting severe stenosis, and 0.77 in distal LCx, suggesting significant stenosis.  He has a family history of CAD, with his mother having CAD/MI in her early 84s. Not a smoker, not diabetic. He has never had a heart cath. No history of ICH, no planned procedures upcoming, no active bleeding. Comfortable lying flat. Denies edema, PND, orthopnea, syncope, palpitations. Rest of ROS negative except as noted.  Review of Systems:     Cardiac Review of Systems: {Y] = yes [ ]  = no  Chest Pain [  Y  ]  Resting SOB [  N ] Exertional SOB  [ Y ]  Orthopnea [ N ]   Pedal Edema [ N  ]    Palpitations Aqua.Slicker  ] Syncope  [ N ]   Presyncope Aqua.Slicker   ]  General Review of Systems: [Y] = yes [  ]=no Constitional: recent weight change [  ]; anorexia [  ]; fatigue [  ]; nausea [  ]; night sweats [  ]; fever [  ]; or chills [  ];  Eyes : blurred vision [  ]; diplopia [   ]; vision changes [  ];  Amaurosis fugax[  ]; Resp: cough [  ];  wheezing[  ];  hemoptysis[  ];  PND [  ];  GI:  gallstones[  ], vomiting[  ];  dysphagia[  ]; melena[  ];  hematochezia [  ]; heartburn[Y  ];   GU: kidney stones [  ]; hematuria[  ];   dysuria [  ];  nocturia[  ]; incontinence [  ];             Skin: rash, swelling[  ];, hair loss[  ];  peripheral edema[  ];  or itching[  ]; Musculosketetal: myalgias[  ];  joint swelling[  ];  joint erythema[  ];  joint pain[  ];  back pain[  ];  Heme/Lymph: bruising[  ];  bleeding[  ];  anemia[  ];  Neuro: TIA[  ];  headaches[  ];  stroke[  ];  vertigo[  ];  seizures[  ];   paresthesias[  ];  difficulty walking[  ];  Psych:depression[  ]; anxiety[  ];  Endocrine: diabetes[  ];  thyroid dysfunction[  ];  Other:  Past  Medical History:  Diagnosis Date  . Arthritis   . Chest pain 11/19/2016  . GERD (gastroesophageal reflux disease)   . High cholesterol   . Hypertension     Medications Prior to Admission  Medication Sig Dispense Refill  . alum & mag hydroxide-simeth (MAALOX/MYLANTA) 200-200-20 MG/5ML suspension Take 15 mLs by mouth every 6 (six) hours as needed for indigestion or heartburn.    . calcium carbonate (TUMS - DOSED IN MG ELEMENTAL CALCIUM) 500 MG chewable tablet Chew 1 tablet by mouth daily as needed for indigestion or heartburn.    . esomeprazole (NEXIUM) 10 MG packet Take 10 mg by mouth daily before breakfast.    . ibuprofen (ADVIL,MOTRIN) 200 MG tablet Take 600 mg by mouth every 6 (six) hours as needed.    Marland Kitchen losartan (COZAAR) 50 MG tablet Take 50 mg by mouth.    . nitroGLYCERIN (NITROSTAT) 0.4 MG SL tablet Place 0.4 mg under the tongue every 5 (five) minutes as needed for chest pain.    . simvastatin (ZOCOR) 40 MG tablet Take 40 mg by mouth daily at 6 PM.     . metoprolol tartrate (LOPRESSOR) 50 MG tablet Take 1 tablet (50 mg total) by mouth once for 1 dose. 1 tablet 0     . [START ON 01/05/2017] aspirin EC  81 mg Oral Daily  . [START ON 01/05/2017] atorvastatin  80 mg Oral q1800  . losartan  50 mg Oral Daily  . metoprolol tartrate  25 mg Oral BID  . [START ON 01/05/2017] pantoprazole  40 mg Oral Q0600    Infusions: . sodium chloride    . heparin 950 Units/hr (01/04/17 1341)  . nitroGLYCERIN      No Known Allergies  Social History   Socioeconomic History  . Marital status: Married    Spouse name: Not on file  . Number of children: Not on file  . Years of education: Not on file  . Highest education level: Not on file  Social Needs  . Financial resource strain: Not on file  . Food insecurity - worry: Not on file  . Food insecurity - inability: Not on file  . Transportation needs - medical: Not on file  . Transportation needs - non-medical: Not on file  Occupational  History  . Not on file  Tobacco Use  . Smoking status: Never Smoker  . Smokeless tobacco: Never Used  Substance and Sexual Activity  . Alcohol use: Yes    Alcohol/week: 1.2 oz    Types: 2 Glasses of wine per week    Comment: daily  . Drug use: No  . Sexual activity: Not on file  Other Topics Concern  . Not on file  Social History Narrative  . Not on file    Family History  Problem Relation Age of Onset  . Heart disease Mother   . Heart attack Mother 6  . CAD Mother   . Multiple myeloma Father     PHYSICAL EXAM: Vitals:   01/04/17 1600 01/04/17 1700  BP: 117/82 108/85  Pulse: 83 70  Resp: 10 15  SpO2: 96% 93%    No intake or output data in the 24 hours ending 01/04/17 1828  General:  Well appearing. No respiratory difficulty HEENT: normal Neck: supple. no JVD. Carotids 2+ bilat; no bruits. No lymphadenopathy or thryomegaly appreciated. Cor: PMI nondisplaced. Regular rate & rhythm. No rubs, gallops or murmurs. Lungs: clear Abdomen: soft, nontender, nondistended. No hepatosplenomegaly. No bruits or masses. Good bowel sounds. Extremities: no cyanosis, clubbing, rash, edema Neuro: alert & oriented x 3, cranial nerves grossly intact. moves all 4 extremities w/o difficulty. Affect pleasant.  ECG: normal sinus rhythm, new TWI in V2-V4  Results for orders placed or performed during the hospital encounter of 01/04/17 (from the past 24 hour(s))  I-stat troponin, ED     Status: None   Collection Time: 01/04/17 11:12 AM  Result Value Ref Range   Troponin i, poc 0.04 0.00 - 0.08 ng/mL   Comment 3          Basic metabolic panel     Status: Abnormal   Collection Time: 01/04/17 11:15 AM  Result Value Ref Range   Sodium 139 135 - 145 mmol/L   Potassium 3.8 3.5 - 5.1 mmol/L   Chloride 104 101 - 111 mmol/L   CO2 28 22 - 32 mmol/L   Glucose, Bld 118 (H) 65 - 99 mg/dL   BUN 12 6 - 20 mg/dL   Creatinine, Ser 0.90 0.61 - 1.24 mg/dL   Calcium 9.0 8.9 - 10.3 mg/dL   GFR calc  non Af Amer >60 >60 mL/min   GFR calc Af Amer >60 >60 mL/min   Anion gap 7 5 - 15  CBC     Status: None   Collection Time: 01/04/17 11:15 AM  Result Value Ref Range   WBC 6.7 4.0 - 10.5 K/uL   RBC 5.00 4.22 - 5.81 MIL/uL   Hemoglobin 16.3 13.0 - 17.0 g/dL   HCT 47.2 39.0 - 52.0 %   MCV 94.4 78.0 - 100.0 fL   MCH 32.6 26.0 - 34.0 pg   MCHC 34.5 30.0 - 36.0 g/dL   RDW 12.3 11.5 - 15.5 %   Platelets 246 150 - 400 K/uL  APTT     Status: None   Collection Time: 01/04/17 11:15 AM  Result Value Ref Range   aPTT 29 24 - 36 seconds  Protime-INR     Status: None   Collection Time: 01/04/17 11:15 AM  Result Value Ref Range   Prothrombin Time 14.0 11.4 - 15.2 seconds   INR 1.09    Dg Chest 2 View  Result Date: 01/04/2017 CLINICAL DATA:  Chest pain over the last month, worsening today. EXAM: CHEST  2 VIEW COMPARISON:  03/22/2012 FINDINGS: Heart size is normal. Mediastinal shadows are normal. The patient has not taken a deep inspiration. Allowing for that, the lungs are felt to be clear. No consolidation or collapse. Vascularity is normal. No effusions. No acute bone finding. IMPRESSION: Poor inspiration.  No active disease suspected. Electronically Signed   By: Nelson Chimes M.D.   On: 01/04/2017 11:47   ASSESSMENT/PLAN: Unstable angina: Now chest pain free on nitro drip, downtitrating to minimize headache. FH, symptoms, recent CTA all concerning for significant CAD. Discussed cath tomorrow, no contraindications noted. -continue heparin -continue nitro drip, titrate for chest pain -trend troponins -received 324 mg aspirin, continue 81 mg aspirin daily -had some disease noted in LM/LAD/LCx/RCA on CT, will hold on P2y12 in case he needs CABG. Not a diabetic but checking A1c. -continue metoprolol -continue losartan -change simvastatin to atorvastatin 80 mg -NPO at midnight for cath -echo after cath -nonsmoker, does not need tobacco cessation -eval for cardiac rehab pending results of cath  and need for revasc  Hypertension:  Currently well controlled -continue losartan, metoprolol  Hyperlipidemia: Given high coronary calcium score and unstable angina, change to atorvastatin 80 mg. Check lipid panel in AM.  Jerrye Bushy: Continue PPI, PRN meds ordered  FULL CODE Buford Dresser, MD, PhD, overnight cardiology provider

## 2017-01-04 NOTE — ED Provider Notes (Signed)
[facility] DEPT Provider Note   CSN: [id number] Arrival date & time: 01/04/17  1056     History   Chief Complaint Chief Complaint  Patient presents with  . Chest Pain    HPI Logan Wells is a 61 y.o. male with a past medical history of HTN, HLD, GERD who presents to the emergency department today for 1 month history of progressive chest pain.  Patient states that one month ago he was able to exercise for approximately 30 minutes on a incline elliptical or incline treadmill but over the last 1 month he has been having central chest pain that he describes as burning, with radiation into both shoulders and down both arms with associated shortness of breath when exercising.  He notes that the pain is brought on commonly while he is at the gym and over the last 1 month has occurred with decreasing amounts of time during exercise.  He notes that 2 weeks ago it only took 10 minutes on a flat treadmill before he would now have chest pain.  The chest pain would typically last <5 minutes before going away with rest.  His chest pain is worsened with exertion and is sometimes associated with diaphoresis.  Over the last 1 week he says he has been getting increasing chest pain with simple activities such as walking.  His chest pain is relieved with rest.  This morning at approximately 1030 he was sitting at the dining room table when he started having the onset of his typical chest pain while at rest.  The onset of chest pain at rest is new for him.  He had the same radiation of his pain with associated shortness of breath.  No nausea, emesis or diaphoresis during this episode.  The pain does not radiate into the patient's jaw/neck/back.  The patient has not taken anything for this. Notes initially pain was "breathtaking" and is now 5/10.  He notes that over the last 1 week he has been trying to take Mylanta and Tums for this but is unsure if it is helping.  He notes that his pain  is typically not related to times after meals.  He had Cheerios this morning for breakfast. No ASA or NTG PTA. Patient is followed by cardiologist Fransico Him of [place]. He was last seen by cardiology on 11/19/16. Family history of premature CAD of patients mother at age 69.  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  Had a exercise tolerance test on 11/26/2016 and classified him as low risk based on the Duke treadmill score.  Echocardiogram also performed on 11/26/2016 and showed ejection fraction of 55% with no wall motion abnormalities.  There was noted grade 1 diastolic dysfunction.  No history of cardiac catheterization. He is a never smoker.   HPI  Past Medical History:  Diagnosis Date  . Arthritis   . Chest pain 11/19/2016  . GERD (gastroesophageal reflux disease)   . High cholesterol   . Hypertension     Patient Active Problem List   Diagnosis Date Noted  . Chest pain 11/19/2016  . GERD (gastroesophageal reflux disease)     Past Surgical History:  Procedure Laterality Date  . SHOULDER SURGERY     Left       Home Medications    Prior to Admission medications   Medication Sig Start Date End Date Taking? Authorizing Provider  esomeprazole ([place])  10 MG packet Take 10 mg by mouth daily before breakfast.    [provider]  losartan (COZAAR) 50 MG tablet Take 50 mg by mouth.    [provider]  metoprolol tartrate (LOPRESSOR) 50 MG tablet Take 1 tablet (50 mg total) by mouth once for 1 dose. 12/22/16 12/22/16  Sueanne Margarita, MD  nitroGLYCERIN (NITROSTAT) 0.4 MG SL tablet Place 0.4 mg under the tongue every 5 (five) minutes as needed for chest pain.    [provider]  simvastatin (ZOCOR) 40 MG tablet Take 40 mg by mouth daily at 6 PM.  08/25/16   [provider]    Family History Family History  Problem Relation Age of Onset  . Heart disease  Mother   . Heart attack Mother 54  . CAD Mother   . Multiple myeloma Father     Social History Social History   Tobacco Use  . Smoking status: Never Smoker  . Smokeless tobacco: Never Used  Substance Use Topics  . Alcohol use: Yes    Alcohol/week: 1.2 oz    Types: 2 Glasses of wine per week    Comment: daily  . Drug use: No     Allergies   Patient has no known allergies.   Review of Systems Review of Systems  All other systems reviewed and are negative.    Physical Exam Updated Vital Signs Ht 5' 10"  (1.778 m)   Wt 77.1 kg (170 lb)   BMI 24.39 kg/m   Physical Exam  Constitutional: He appears well-developed and well-nourished.  HENT:  Head: Normocephalic and atraumatic.  Right Ear: External ear normal.  Left Ear: External ear normal.  Nose: Nose normal.  Mouth/Throat: Uvula is midline, oropharynx is clear and moist and mucous membranes are normal. No tonsillar exudate.  Eyes: Pupils are equal, round, and reactive to light. Right eye exhibits no discharge. Left eye exhibits no discharge. No scleral icterus.  Neck: Trachea normal. Neck supple. No JVD present. No spinous process tenderness present. Carotid bruit is not present. No neck rigidity. Normal range of motion present.  Cardiovascular: Normal rate, regular rhythm and intact distal pulses. Exam reveals no friction rub.  No murmur heard. Pulses:      Radial pulses are 2+ on the right side, and 2+ on the left side.       Dorsalis pedis pulses are 2+ on the right side, and 2+ on the left side.       Posterior tibial pulses are 2+ on the right side, and 2+ on the left side.  No lower extremity swelling or edema. Calves symmetric in size bilaterally.  Pulmonary/Chest: Effort normal and breath sounds normal. He exhibits no tenderness.  Abdominal: Soft. Bowel sounds are normal. There is no hepatomegaly. There is no tenderness. There is no rebound and no guarding.  Musculoskeletal: He exhibits no edema.    Lymphadenopathy:    He has no cervical adenopathy.  Neurological: He is alert.  Skin: Skin is warm and dry. No rash noted. He is not diaphoretic.  Psychiatric: He has a normal mood and affect.  Nursing note and vitals reviewed.    ED Treatments / Results  Labs (all labs ordered are listed, but only abnormal results are displayed) Labs Reviewed  BASIC METABOLIC PANEL  CBC  I-STAT TROPONIN, ED    EKG  EKG Interpretation  Date/Time:  Sunday January 04 2017 11:06:37 EST Ventricular Rate:  68 PR Interval:    QRS Duration: 106 QT  Interval:  430 QTC Calculation: 458 R Axis:   63 Text Interpretation:  Sinus rhythm T wave abnormality Abnormal ekg Confirmed by Carmin Muskrat [phone]) on 01/04/2017 11:23:49 AM       Radiology Dg Chest 2 View  Result Date: 01/04/2017 CLINICAL DATA:  Chest pain over the last month, worsening today. EXAM: CHEST  2 VIEW COMPARISON:  03/22/2012 FINDINGS: Heart size is normal. Mediastinal shadows are normal. The patient has not taken a deep inspiration. Allowing for that, the lungs are felt to be clear. No consolidation or collapse. Vascularity is normal. No effusions. No acute bone finding. IMPRESSION: Poor inspiration.  No active disease suspected. Electronically Signed   By: Nelson Chimes M.D.   On: 01/04/2017 11:47    Procedures Procedures (including critical care time)  Medications Ordered in ED Medications  nitroGLYCERIN (NITROSTAT) SL tablet 0.4 mg (0.4 mg Sublingual Given 01/04/17 1151)  aspirin chewable tablet 324 mg (324 mg Oral Given 01/04/17 1120)     Initial Impression / Assessment and Plan / ED Course  I have reviewed the triage vital signs and the nursing notes.  Pertinent labs & imaging results that were available during my care of the patient were reviewed by me and considered in my medical decision making (see chart for details).     61 year old male with typical symptoms of unstable angina.  Notes central chest pain with  radiation to bilateral extremities that occurs with decreasing amounts of activity with associated sob +/- diaphoresis and relieved with rest <5 minutes. Patient had chest pain today that onset at rest and has not relived which is new for the patient.  Patient has risk factors including hypertension, hyperlipidemia and early family CVD (mother 89).  Patient is followed by cardiologist Fransico Him of [place]. 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  Most recent echo on 11/26/16 showed ejection fraction of 55% with no wall motion abnormalities.  There was grade 1 diastolic dysfunction. Patient still with chest pain at 5/10. Will give 324 ASA, SL NTG and reassess. Will obtain TN, ECG, CXR and basic blood work.   ECG shows new t-wave inversions in antero-septal leads when compared to previous ecg on 11/18/16. No ST elevation. Rate 68. NSR. No QT prolongation. Initial Tn 0.04. Labs otherwise reassuring. CXR with no evidence of disease.   Patient given 2 SL NTG during time of recheck. Pain has improved from 5/10 to 3/10. 3rd SL NTG given and will reassess.   After the 3rd SL the patient is still with 3/10 pain. Will start on NTG drip and consult cardiology due to CT scan showing disease, suggestive history and EKG with new T-wave inversions.   Discussed case with Dr. Bronson Ing, who I appreciate calling me back. He advised the patient being started on a Heparin drip and admitting to [facility] under him to a telemetry bed with plan for the patient to undergo heart catheterization tomorrow. Heparin consult to pharmacy placed. Patient made aware of plan. Wife and patient are in agreement.   Final Clinical Impressions(s) / ED Diagnoses   Final diagnoses:  Chest pain, unspecified type    ED Discharge Orders    None       Lorelle Gibbs 01/04/17 1332    Carmin Muskrat, MD 01/05/17 1048

## 2017-01-04 NOTE — Progress Notes (Signed)
ANTICOAGULATION CONSULT NOTE - Follow Up Consult  Pharmacy Consult for Heparin  Indication: chest pain/ACS  No Known Allergies  Patient Measurements: Height: 5\' 10"  (177.8 cm) Weight: 170 lb 9.6 oz (77.4 kg) IBW/kg (Calculated) : 73  Vital Signs: Temp: 97.8 F (36.6 C) (12/09 1957) Temp Source: Oral (12/09 1957) BP: 122/87 (12/09 2000) Pulse Rate: 64 (12/09 2000)  Labs: Recent Labs    01/04/17 1115 01/04/17 2024  HGB 16.3  --   HCT 47.2  --   PLT 246  --   APTT 29  --   LABPROT 14.0  --   INR 1.09  --   HEPARINUNFRC  --  0.36  CREATININE 0.90  --   TROPONINI  --  0.32*    Estimated Creatinine Clearance: 91.3 mL/min (by C-G formula based on SCr of 0.9 mg/dL).  Assessment: 61 y/o M on heparin for CP, initial heparin level is therapeutic, possible cath tomorrow  Goal of Therapy:  Heparin level 0.3-0.7 units/ml Monitor platelets by anticoagulation protocol: Yes   Plan:  Heparin 1000 units/hr Confirmatory heparin level with AM labs  Logan Wells, Logan Wells 01/04/2017,9:50 PM

## 2017-01-04 NOTE — ED Notes (Signed)
Patient transported to X-ray 

## 2017-01-04 NOTE — Progress Notes (Signed)
CRITICAL VALUE ALERT  Critical Value:  Troponin 0.32  Date & Time Notied:  01/04/17 21:55  Provider Notified:Dr Bridgett  Orders Received/Actions taken: Awaiting call back from text page. Will continue to monitor.

## 2017-01-05 ENCOUNTER — Inpatient Hospital Stay (HOSPITAL_COMMUNITY): Payer: 59

## 2017-01-05 ENCOUNTER — Encounter (HOSPITAL_COMMUNITY): Payer: Self-pay | Admitting: Cardiology

## 2017-01-05 ENCOUNTER — Encounter (HOSPITAL_COMMUNITY)
Admission: EM | Disposition: A | Discharge status: Home / Self Care | Payer: Self-pay | Source: Home / Self Care | Attending: Cardiology

## 2017-01-05 ENCOUNTER — Other Ambulatory Visit: Payer: Self-pay

## 2017-01-05 DIAGNOSIS — I214 Non-ST elevation (NSTEMI) myocardial infarction: Principal | ICD-10-CM

## 2017-01-05 DIAGNOSIS — I10 Essential (primary) hypertension: Secondary | ICD-10-CM

## 2017-01-05 DIAGNOSIS — I25110 Atherosclerotic heart disease of native coronary artery with unstable angina pectoris: Secondary | ICD-10-CM

## 2017-01-05 HISTORY — PX: LEFT HEART CATH AND CORONARY ANGIOGRAPHY: CATH118249

## 2017-01-05 LAB — CBC
HCT: 45.5 % (ref 39.0–52.0)
HEMOGLOBIN: 15.2 g/dL (ref 13.0–17.0)
MCH: 31.9 pg (ref 26.0–34.0)
MCHC: 33.4 g/dL (ref 30.0–36.0)
MCV: 95.4 fL (ref 78.0–100.0)
PLATELETS: 228 10*3/uL (ref 150–400)
RBC: 4.77 MIL/uL (ref 4.22–5.81)
RDW: 12.5 % (ref 11.5–15.5)
WBC: 5.6 10*3/uL (ref 4.0–10.5)

## 2017-01-05 LAB — COMPREHENSIVE METABOLIC PANEL
ALBUMIN: 4 g/dL (ref 3.5–5.0)
ALK PHOS: 46 U/L (ref 38–126)
ALT: 37 U/L (ref 17–63)
ANION GAP: 8 (ref 5–15)
AST: 32 U/L (ref 15–41)
BUN: 11 mg/dL (ref 6–20)
CALCIUM: 8.8 mg/dL — AB (ref 8.9–10.3)
CHLORIDE: 101 mmol/L (ref 101–111)
CO2: 28 mmol/L (ref 22–32)
Creatinine, Ser: 1 mg/dL (ref 0.61–1.24)
GFR calc Af Amer: >60 mL/min (ref >60)
GFR calc non Af Amer: >60 mL/min (ref >60)
GLUCOSE: 106 mg/dL — AB (ref 65–99)
POTASSIUM: 4.1 mmol/L (ref 3.5–5.1)
SODIUM: 137 mmol/L (ref 135–145)
Total Bilirubin: 0.8 mg/dL (ref 0.3–1.2)
Total Protein: 7.2 g/dL (ref 6.5–8.1)

## 2017-01-05 LAB — TSH: TSH: 2.249 u[IU]/mL (ref 0.350–4.500)

## 2017-01-05 LAB — HEPATIC FUNCTION PANEL
ALT: 31 U/L (ref 17–63)
AST: 30 U/L (ref 15–41)
Albumin: 3.8 g/dL (ref 3.5–5.0)
Alkaline Phosphatase: 45 U/L (ref 38–126)
Bilirubin, Direct: 0.2 mg/dL (ref 0.1–0.5)
Indirect Bilirubin: 0.1 mg/dL — ABNORMAL LOW (ref 0.3–0.9)
Total Bilirubin: 0.3 mg/dL (ref 0.3–1.2)
Total Protein: 6.9 g/dL (ref 6.5–8.1)

## 2017-01-05 LAB — URINALYSIS, ROUTINE W REFLEX MICROSCOPIC
Bilirubin Urine: NEGATIVE
Glucose, UA: NEGATIVE mg/dL
Hgb urine dipstick: NEGATIVE
Ketones, ur: NEGATIVE mg/dL
Leukocytes, UA: NEGATIVE
Nitrite: NEGATIVE
Protein, ur: NEGATIVE mg/dL
Specific Gravity, Urine: 1.021 (ref 1.005–1.030)
pH: 7 (ref 5.0–8.0)

## 2017-01-05 LAB — ABO/RH: ABO/RH(D): A POS

## 2017-01-05 LAB — HEPARIN LEVEL (UNFRACTIONATED): HEPARIN UNFRACTIONATED: 0.45 [IU]/mL (ref 0.30–0.70)

## 2017-01-05 LAB — SURGICAL PCR SCREEN
MRSA, PCR: NEGATIVE
Staphylococcus aureus: POSITIVE — AB

## 2017-01-05 LAB — HEMOGLOBIN A1C
Hgb A1c MFr Bld: 5.5 % (ref 4.8–5.6)
Mean Plasma Glucose: 111.15 mg/dL

## 2017-01-05 LAB — LIPID PANEL
CHOL/HDL RATIO: 2.7 ratio
Cholesterol: 184 mg/dL (ref 0–200)
HDL: 67 mg/dL (ref >40)
LDL Cholesterol: 101 mg/dL — ABNORMAL HIGH (ref 0–99)
TRIGLYCERIDES: 80 mg/dL (ref <150)
VLDL: 16 mg/dL (ref 0–40)

## 2017-01-05 LAB — PREPARE RBC (CROSSMATCH)

## 2017-01-05 LAB — HIV ANTIBODY (ROUTINE TESTING W REFLEX): HIV Screen 4th Generation wRfx: NONREACTIVE

## 2017-01-05 LAB — TROPONIN I
TROPONIN I: 0.22 ng/mL — AB (ref <0.03)
Troponin I: 0.35 ng/mL (ref <0.03)

## 2017-01-05 SURGERY — LEFT HEART CATH AND CORONARY ANGIOGRAPHY
Anesthesia: LOCAL

## 2017-01-05 MED ORDER — VERAPAMIL HCL 2.5 MG/ML IV SOLN
INTRAVENOUS | Status: AC
  Filled 2017-01-05: qty 2

## 2017-01-05 MED ORDER — NITROGLYCERIN IN D5W 200-5 MCG/ML-% IV SOLN
2.0000 ug/min | INTRAVENOUS | Status: DC
  Filled 2017-01-05: qty 250

## 2017-01-05 MED ORDER — PLASMA-LYTE 148 IV SOLN
INTRAVENOUS | Status: AC
  Administered 2017-01-06: 08:00:00
  Filled 2017-01-05: qty 2.5

## 2017-01-05 MED ORDER — MAGNESIUM SULFATE 50 % IJ SOLN
40.0000 meq | INTRAMUSCULAR | Status: DC
  Filled 2017-01-05: qty 9.85

## 2017-01-05 MED ORDER — TRANEXAMIC ACID (OHS) PUMP PRIME SOLUTION
2.0000 mg/kg | INTRAVENOUS | Status: DC
  Filled 2017-01-05: qty 1.56

## 2017-01-05 MED ORDER — IOPAMIDOL (ISOVUE-370) INJECTION 76%
INTRAVENOUS | Status: AC
  Filled 2017-01-05: qty 100

## 2017-01-05 MED ORDER — VANCOMYCIN HCL 10 G IV SOLR
1250.0000 mg | INTRAVENOUS | Status: AC
  Administered 2017-01-06: 1250 mg via INTRAVENOUS
  Filled 2017-01-05: qty 1250

## 2017-01-05 MED ORDER — FENTANYL CITRATE (PF) 100 MCG/2ML IJ SOLN
INTRAMUSCULAR | Status: AC
  Filled 2017-01-05: qty 2

## 2017-01-05 MED ORDER — DIAZEPAM 5 MG PO TABS
5.0000 mg | ORAL_TABLET | Freq: Once | ORAL | Status: AC
  Administered 2017-01-06: 5 mg via ORAL
  Filled 2017-01-05: qty 1

## 2017-01-05 MED ORDER — SODIUM CHLORIDE 0.9 % WEIGHT BASED INFUSION
1.0000 mL/kg/h | INTRAVENOUS | Status: DC
  Administered 2017-01-05: 1 mL/kg/h via INTRAVENOUS

## 2017-01-05 MED ORDER — HEPARIN SODIUM (PORCINE) 1000 UNIT/ML IJ SOLN
INTRAMUSCULAR | Status: DC | PRN
  Administered 2017-01-05: 4000 [IU] via INTRAVENOUS

## 2017-01-05 MED ORDER — DEXTROSE 5 % IV SOLN
1.5000 g | INTRAVENOUS | Status: AC
  Administered 2017-01-06: .75 g via INTRAVENOUS
  Administered 2017-01-06: 1.5 g via INTRAVENOUS
  Filled 2017-01-05: qty 1.5

## 2017-01-05 MED ORDER — HEPARIN (PORCINE) IN NACL 100-0.45 UNIT/ML-% IJ SOLN
1100.0000 [IU]/h | INTRAMUSCULAR | Status: DC
  Administered 2017-01-05: 1000 [IU]/h via INTRAVENOUS
  Filled 2017-01-05: qty 250

## 2017-01-05 MED ORDER — SODIUM CHLORIDE 0.9% FLUSH: 3.0000 mL | INTRAVENOUS | Status: DC | PRN

## 2017-01-05 MED ORDER — SODIUM CHLORIDE 0.9% FLUSH: 3.0000 mL | Freq: Two times a day (BID) | INTRAVENOUS | Status: DC

## 2017-01-05 MED ORDER — HEPARIN (PORCINE) IN NACL 2-0.9 UNIT/ML-% IJ SOLN
INTRAMUSCULAR | Status: AC
  Filled 2017-01-05: qty 500

## 2017-01-05 MED ORDER — MIDAZOLAM HCL 2 MG/2ML IJ SOLN
INTRAMUSCULAR | Status: DC | PRN
  Administered 2017-01-05: 2 mg via INTRAVENOUS

## 2017-01-05 MED ORDER — TRANEXAMIC ACID 1000 MG/10ML IV SOLN
1.5000 mg/kg/h | INTRAVENOUS | Status: AC
  Administered 2017-01-06: 1.5 mg/kg/h via INTRAVENOUS
  Filled 2017-01-05: qty 25

## 2017-01-05 MED ORDER — SODIUM CHLORIDE 0.9 % IV SOLN
250.0000 mL | INTRAVENOUS | Status: DC | PRN
Start: 2017-01-05 — End: 2017-01-06

## 2017-01-05 MED ORDER — CHLORHEXIDINE GLUCONATE 4 % EX LIQD
60.0000 mL | Freq: Once | CUTANEOUS | Status: AC
  Administered 2017-01-06: 4 via TOPICAL
  Filled 2017-01-05: qty 60

## 2017-01-05 MED ORDER — SODIUM CHLORIDE 0.9 % IV SOLN
30.0000 ug/min | INTRAVENOUS | Status: DC
  Filled 2017-01-05: qty 2

## 2017-01-05 MED ORDER — DEXMEDETOMIDINE HCL IN NACL 400 MCG/100ML IV SOLN
0.1000 ug/kg/h | INTRAVENOUS | Status: DC
  Filled 2017-01-05: qty 100

## 2017-01-05 MED ORDER — METOPROLOL TARTRATE 12.5 MG HALF TABLET
12.5000 mg | ORAL_TABLET | Freq: Once | ORAL | Status: AC
Start: 2017-01-06 — End: 2017-01-06
  Administered 2017-01-06: 12.5 mg via ORAL
  Filled 2017-01-05: qty 1

## 2017-01-05 MED ORDER — CHLORHEXIDINE GLUCONATE 0.12 % MT SOLN
15.0000 mL | Freq: Once | OROMUCOSAL | Status: AC
  Administered 2017-01-06: 15 mL via OROMUCOSAL
  Filled 2017-01-05: qty 15

## 2017-01-05 MED ORDER — FENTANYL CITRATE (PF) 100 MCG/2ML IJ SOLN
INTRAMUSCULAR | Status: DC | PRN
  Administered 2017-01-05: 25 ug via INTRAVENOUS

## 2017-01-05 MED ORDER — DOPAMINE-DEXTROSE 3.2-5 MG/ML-% IV SOLN
0.0000 ug/kg/min | INTRAVENOUS | Status: DC
  Filled 2017-01-05: qty 250

## 2017-01-05 MED ORDER — DEXTROSE 5 % IV SOLN
0.0000 ug/min | INTRAVENOUS | Status: DC
  Filled 2017-01-05: qty 4

## 2017-01-05 MED ORDER — LIDOCAINE HCL (PF) 1 % IJ SOLN
INTRAMUSCULAR | Status: DC | PRN
  Administered 2017-01-05: 2 mL

## 2017-01-05 MED ORDER — POTASSIUM CHLORIDE 2 MEQ/ML IV SOLN
80.0000 meq | INTRAVENOUS | Status: DC
Start: 2017-01-06 — End: 2017-01-06
  Filled 2017-01-05: qty 40

## 2017-01-05 MED ORDER — MIDAZOLAM HCL 2 MG/2ML IJ SOLN
INTRAMUSCULAR | Status: AC
Start: 2017-01-05 — End: 2017-01-05
  Filled 2017-01-05: qty 2

## 2017-01-05 MED ORDER — INSULIN REGULAR HUMAN 100 UNIT/ML IJ SOLN
INTRAMUSCULAR | Status: DC
  Filled 2017-01-05: qty 1

## 2017-01-05 MED ORDER — SODIUM CHLORIDE 0.9 % WEIGHT BASED INFUSION: 1.0000 mL/kg/h | INTRAVENOUS | Status: AC

## 2017-01-05 MED ORDER — ASPIRIN EC 81 MG PO TBEC
81.0000 mg | DELAYED_RELEASE_TABLET | Freq: Every day | ORAL | Status: DC
  Administered 2017-01-05: 81 mg via ORAL
  Filled 2017-01-05: qty 1

## 2017-01-05 MED ORDER — TRANEXAMIC ACID (OHS) BOLUS VIA INFUSION
15.0000 mg/kg | INTRAVENOUS | Status: AC
  Administered 2017-01-06: 1170 mg via INTRAVENOUS
  Filled 2017-01-05: qty 1170

## 2017-01-05 MED ORDER — IOPAMIDOL (ISOVUE-370) INJECTION 76%
INTRAVENOUS | Status: DC | PRN
  Administered 2017-01-05: 70 mL via INTRA_ARTERIAL

## 2017-01-05 MED ORDER — ALPRAZOLAM 0.5 MG PO TABS
1.0000 mg | ORAL_TABLET | Freq: Three times a day (TID) | ORAL | Status: DC | PRN
  Administered 2017-01-05: 1 mg via ORAL
  Filled 2017-01-05: qty 2

## 2017-01-05 MED ORDER — DEXTROSE 5 % IV SOLN
750.0000 mg | INTRAVENOUS | Status: DC
  Filled 2017-01-05: qty 750

## 2017-01-05 MED ORDER — HEPARIN SODIUM (PORCINE) 1000 UNIT/ML IJ SOLN
INTRAMUSCULAR | Status: AC
  Filled 2017-01-05: qty 1

## 2017-01-05 MED ORDER — CHLORHEXIDINE GLUCONATE 4 % EX LIQD
60.0000 mL | Freq: Once | CUTANEOUS | Status: AC
Start: 2017-01-05 — End: 2017-01-05
  Administered 2017-01-05: 4 via TOPICAL
  Filled 2017-01-05: qty 15

## 2017-01-05 MED ORDER — HEPARIN (PORCINE) IN NACL 2-0.9 UNIT/ML-% IJ SOLN
INTRAMUSCULAR | Status: AC | PRN
  Administered 2017-01-05: 1000 mL via INTRA_ARTERIAL

## 2017-01-05 MED ORDER — BISACODYL 5 MG PO TBEC
5.0000 mg | DELAYED_RELEASE_TABLET | Freq: Once | ORAL | Status: AC
  Administered 2017-01-05: 5 mg via ORAL
  Filled 2017-01-05: qty 1

## 2017-01-05 MED ORDER — LIDOCAINE HCL (PF) 1 % IJ SOLN
INTRAMUSCULAR | Status: AC
Start: 2017-01-05 — End: 2017-01-05
  Filled 2017-01-05: qty 30

## 2017-01-05 MED ORDER — HEPARIN SODIUM (PORCINE) 1000 UNIT/ML IJ SOLN
INTRAMUSCULAR | Status: DC
  Filled 2017-01-05: qty 30

## 2017-01-05 MED ORDER — VERAPAMIL HCL 2.5 MG/ML IV SOLN
INTRAVENOUS | Status: DC | PRN
  Administered 2017-01-05: 10:00:00 via INTRA_ARTERIAL

## 2017-01-05 SURGICAL SUPPLY — 10 items

## 2017-01-05 NOTE — Interval H&P Note (Signed)
History and Physical Interval Note:  01/05/2017 10:03 AM  Logan Wells  has presented today for surgery, with the diagnosis of unstable angina  The various methods of treatment have been discussed with the patient and family. After consideration of risks, benefits and other options for treatment, the patient has consented to  Procedure(s): LEFT HEART CATH AND CORONARY ANGIOGRAPHY (N/A) as a surgical intervention .  The patient's history has been reviewed, patient examined, no change in status, stable for surgery.  I have reviewed the patient's chart and labs.  Questions were answered to the patient's satisfaction.   Cath Lab Visit (complete for each Cath Lab visit)  Clinical Evaluation Leading to the Procedure:   ACS: Yes.    Non-ACS:    Anginal Classification: CCS IV  Anti-ischemic medical therapy: Maximal Therapy (2 or more classes of medications)  Non-Invasive Test Results: High-risk stress test findings: cardiac mortality >3%/year  Prior CABG: No previous CABG        Theron AristaPeter Cone HealthJordanMD,FACC 01/05/2017 10:03 AM

## 2017-01-05 NOTE — H&P (View-Only) (Signed)
Progress Note  Patient Name: Logan Wells Date of Encounter: 01/05/2017  Primary Cardiologist: Mayford Knifeurner  Subjective   No recurrent chest pain since admission  Inpatient Medications    Scheduled Meds: . aspirin EC  81 mg Oral Daily  . atorvastatin  80 mg Oral q1800  . losartan  50 mg Oral Daily  . metoprolol tartrate  25 mg Oral BID  . pantoprazole  40 mg Oral Q0600   Continuous Infusions: . sodium chloride    . sodium chloride 1 mL/kg/hr (01/05/17 0540)  . heparin 1,000 Units/hr (01/04/17 2202)  . nitroGLYCERIN 1.5 mcg/min (01/04/17 2017)   PRN Meds: sodium chloride, acetaminophen, alum & mag hydroxide-simeth, calcium carbonate, ondansetron (ZOFRAN) IV, zolpidem   Vital Signs    Vitals:   01/04/17 2000 01/05/17 0029 01/05/17 0602 01/05/17 0639  BP: 122/87 110/83  102/76  Pulse: 64 61  66  Resp: 11 11  14   Temp:    97.6 F (36.4 C)  TempSrc:    Oral  SpO2: 96% 96%  96%  Weight:   171 lb 15.3 oz (78 kg)   Height:        Intake/Output Summary (Last 24 hours) at 01/05/2017 0832 Last data filed at 01/05/2017 0300 Gross per 24 hour  Intake 398.83 ml  Output -  Net 398.83 ml    I/O since admission: +398  Filed Weights   01/04/17 1101 01/04/17 1814 01/05/17 0602  Weight: 170 lb (77.1 kg) 170 lb 9.6 oz (77.4 kg) 171 lb 15.3 oz (78 kg)    Telemetry    Sinus rhythm at 66 - Personally Reviewed  ECG    ECG (independently read by me): NSR at 62 with marked T wave inversion V2-6 and I and avL.  Physical Exam   BP 102/76 (BP Location: Right Arm)   Pulse 66   Temp 97.6 F (36.4 C) (Oral)   Resp 14   Ht 5\' 10"  (1.778 m)   Wt 171 lb 15.3 oz (78 kg)   SpO2 96%   BMI 24.67 kg/m  General: Alert, oriented, no distress.  Skin: normal turgor, no rashes, warm and dry HEENT: Normocephalic, atraumatic. Pupils equal round and reactive to light; sclera anicteric; extraocular muscles intact; Nose without nasal septal hypertrophy Mouth/Parynx benign; Mallinpatti  scale 3 Neck: No JVD, no carotid bruits; normal carotid upstroke Lungs: clear to ausculatation and percussion; no wheezing or rales Chest wall: without tenderness to palpitation Heart: PMI not displaced, RRR, s1 s2 normal, 1/6 systolic murmur, no diastolic murmur, no rubs, gallops, thrills, or heaves Abdomen: soft, nontender; no hepatosplenomehaly, BS+; abdominal aorta nontender and not dilated by palpation. Back: no CVA tenderness Pulses 2+ Musculoskeletal: full range of motion, normal strength, no joint deformities Extremities: no clubbing cyanosis or edema, Homan's sign negative  Neurologic: grossly nonfocal; Cranial nerves grossly wnl Psychologic: Normal mood and affect  Labs    Chemistry Recent Labs  Lab 01/04/17 1115  NA 139  K 3.8  CL 104  CO2 28  GLUCOSE 118*  BUN 12  CREATININE 0.90  CALCIUM 9.0  GFRNONAA >60  GFRAA >60  ANIONGAP 7     Hematology Recent Labs  Lab 01/04/17 1115 01/05/17 0808  WBC 6.7 5.6  RBC 5.00 4.77  HGB 16.3 15.2  HCT 47.2 45.5  MCV 94.4 95.4  MCH 32.6 31.9  MCHC 34.5 33.4  RDW 12.3 12.5  PLT 246 228    Cardiac Enzymes Recent Labs  Lab 01/04/17 2024 01/05/17 0019  TROPONINI 0.32* 0.35*    Recent Labs  Lab 01/04/17 1112  TROPIPOC 0.04     BNPNo results for input(s): BNP, PROBNP in the last 168 hours.   DDimer No results for input(s): DDIMER in the last 168 hours.   Lipid Panel  No results found for: CHOL, TRIG, HDL, CHOLHDL, VLDL, LDLCALC, LDLDIRECT   Radiology    Dg Chest 2 View  Result Date: 01/04/2017 CLINICAL DATA:  Chest pain over the last month, worsening today. EXAM: CHEST  2 VIEW COMPARISON:  03/22/2012 FINDINGS: Heart size is normal. Mediastinal shadows are normal. The patient has not taken a deep inspiration. Allowing for that, the lungs are felt to be clear. No consolidation or collapse. Vascularity is normal. No effusions. No acute bone finding. IMPRESSION: Poor inspiration.  No active disease suspected.  Electronically Signed   By: Paulina FusiMark  Shogry M.D.   On: 01/04/2017 11:47    Cardiac Studies   FINDINGS: Non-cardiac: [facility]  Calcium Score: Coronary artery calcium score of 1562 Agatston units.  Coronary Arteries: Left dominant with no anomalies  LM:  Calcified plaque distal left main, mild stenosis.  LAD system: Extensive mixed plaque in the proximal and mid LAD. I suspect moderate to severe stenosis.  Circumflex system: Small ramus, no significant disease. Mixed plaque with around 50% stenosis proximal LCx proximal to OM1. Mixed plaque with mild stenosis proximal OM1. Mixed plaque with mild stenosis mid LCx. The LCx ends in a left-sided PDA. This vessel is diffusely diseased, suspect severe stenosis.  RCA system: Nondominant vessel. Mixed plaque with moderate stenosis in the proximal RCA.  IMPRESSION: 1. Coronary artery calcium score 1562 Agatston units, placing the patient in the 99th percentile for age and gender and suggesting high risk for future cardiac events.  2. Suspect severe stenosis in the proximal to mid LAD and the left PDA. Possible moderate stenosis in the non-dominant RCA.  I think this patient needs catheterization. I will send for CT FFR to confirm.  CT FFR Results:  MEDICATIONS: None.  IMPRESSION:  FFR < 0.5 in mid LAD, suggesting severe stenosis proximal/mid LAD.  FFR 0.77 distal LCx, suggesting hemodynamically significant stenosis in the mid LCx.  Patient Profile     Logan Wells is a 61 yo man with PMH of hyperlipidemia, hypertension, and GERD who presents with increasing frequency of chest discomfort with activity  Assessment & Plan    1. NSTEMI/UAP: Mild troponin elevation at 0.35. Multivessel plaque noted on CT with suspected significant stenosis in LAD and LCX per FFR.  No recurrent chest pain on NTG/heparin. ECG worrisome for LAD ischemia. Plan definitive cath today with possible PCI.  I  have reviewed the risks, indications, and alternatives to cardiac catheterization, possible angioplasty, and stenting with the patient. Risks include but are not limited to bleeding, infection, vascular injury, stroke, myocardial infection, arrhythmia, kidney injury, radiation-related injury in the case of prolonged fluoroscopy use, emergency cardiac surgery, and death. The patient understands the risks of serious complication is 1-2 in 1000 with diagnostic cardiac cath and 1-2% or less with angioplasty/stenting.   2.  Hyperlipidemia: now on atorvastatin 80 mg.   3. Essential hypertension: on losartan, metoprolol  4. GERD: on PPI  Signed, Lennette Biharihomas A. Jahnae Mcadoo, MD, [facility] 01/05/2017, 8:32 AM

## 2017-01-05 NOTE — Progress Notes (Signed)
ANTICOAGULATION CONSULT NOTE - Follow Up Consult  Pharmacy Consult for Heparin  Indication: chest pain/ACS  No Known Allergies  Patient Measurements: Height: 5\' 10"  (177.8 cm) Weight: 171 lb 15.3 oz (78 kg) IBW/kg (Calculated) : 73  Vital Signs: Temp: 97.5 F (36.4 C) (12/10 0849) Temp Source: Oral (12/10 0849) BP: 134/89 (12/10 1036) Pulse Rate: 70 (12/10 1036)  Labs: Recent Labs    01/04/17 1115 01/04/17 2024 01/05/17 0019 01/05/17 0808  HGB 16.3  --   --  15.2  HCT 47.2  --   --  45.5  PLT 246  --   --  228  APTT 29  --   --   --   LABPROT 14.0  --   --   --   INR 1.09  --   --   --   HEPARINUNFRC  --  0.36  --  0.45  CREATININE 0.90  --   --  1.00  TROPONINI  --  0.32* 0.35* 0.22*    Estimated Creatinine Clearance: 82.1 mL/min (by C-G formula based on SCr of 1 mg/dL).  Assessment: 61 y/o M on heparin for CP. He is now s/p cath with 3VCAD. Pharmacy consulted to restart heparin 8 hrs post sheath removal  (pulled ~ 10: 30am; radial approach) -last heparin rate was 1000 units/hr with heparin level= 0.45  Goal of Therapy:  Heparin level 0.3-0.7 units/ml Monitor platelets by anticoagulation protocol: Yes   Plan:  -restart heparin at 6:30pm at 1000 units/hr -Heparin level in 6 hours and daily wth CBC daily  Harland GermanAndrew Sherril Heyward, Pharm D 01/05/2017 11:09 AM

## 2017-01-05 NOTE — Progress Notes (Signed)
 Progress Note  Patient Name: Logan Wells Date of Encounter: 01/05/2017  Primary Cardiologist: Turner  Subjective   No recurrent chest pain since admission  Inpatient Medications    Scheduled Meds: . aspirin EC  81 mg Oral Daily  . atorvastatin  80 mg Oral q1800  . losartan  50 mg Oral Daily  . metoprolol tartrate  25 mg Oral BID  . pantoprazole  40 mg Oral Q0600   Continuous Infusions: . sodium chloride    . sodium chloride 1 mL/kg/hr (01/05/17 0540)  . heparin 1,000 Units/hr (01/04/17 2202)  . nitroGLYCERIN 1.5 mcg/min (01/04/17 2017)   PRN Meds: sodium chloride, acetaminophen, alum & mag hydroxide-simeth, calcium carbonate, ondansetron (ZOFRAN) IV, zolpidem   Vital Signs    Vitals:   01/04/17 2000 01/05/17 0029 01/05/17 0602 01/05/17 0639  BP: 122/87 110/83  102/76  Pulse: 64 61  66  Resp: 11 11  14  Temp:    97.6 F (36.4 C)  TempSrc:    Oral  SpO2: 96% 96%  96%  Weight:   171 lb 15.3 oz (78 kg)   Height:        Intake/Output Summary (Last 24 hours) at 01/05/2017 0832 Last data filed at 01/05/2017 0300 Gross per 24 hour  Intake 398.83 ml  Output -  Net 398.83 ml    I/O since admission: +398  Filed Weights   01/04/17 1101 01/04/17 1814 01/05/17 0602  Weight: 170 lb (77.1 kg) 170 lb 9.6 oz (77.4 kg) 171 lb 15.3 oz (78 kg)    Telemetry    Sinus rhythm at 66 - Personally Reviewed  ECG    ECG (independently read by me): NSR at 62 with marked T wave inversion V2-6 and I and avL.  Physical Exam   BP 102/76 (BP Location: Right Arm)   Pulse 66   Temp 97.6 F (36.4 C) (Oral)   Resp 14   Ht 5' 10" (1.778 m)   Wt 171 lb 15.3 oz (78 kg)   SpO2 96%   BMI 24.67 kg/m  General: Alert, oriented, no distress.  Skin: normal turgor, no rashes, warm and dry HEENT: Normocephalic, atraumatic. Pupils equal round and reactive to light; sclera anicteric; extraocular muscles intact; Nose without nasal septal hypertrophy Mouth/Parynx benign; Mallinpatti  scale 3 Neck: No JVD, no carotid bruits; normal carotid upstroke Lungs: clear to ausculatation and percussion; no wheezing or rales Chest wall: without tenderness to palpitation Heart: PMI not displaced, RRR, s1 s2 normal, 1/6 systolic murmur, no diastolic murmur, no rubs, gallops, thrills, or heaves Abdomen: soft, nontender; no hepatosplenomehaly, BS+; abdominal aorta nontender and not dilated by palpation. Back: no CVA tenderness Pulses 2+ Musculoskeletal: full range of motion, normal strength, no joint deformities Extremities: no clubbing cyanosis or edema, Homan's sign negative  Neurologic: grossly nonfocal; Cranial nerves grossly wnl Psychologic: Normal mood and affect  Labs    Chemistry Recent Labs  Lab 01/04/17 1115  NA 139  K 3.8  CL 104  CO2 28  GLUCOSE 118*  BUN 12  CREATININE 0.90  CALCIUM 9.0  GFRNONAA >60  GFRAA >60  ANIONGAP 7     Hematology Recent Labs  Lab 01/04/17 1115 01/05/17 0808  WBC 6.7 5.6  RBC 5.00 4.77  HGB 16.3 15.2  HCT 47.2 45.5  MCV 94.4 95.4  MCH 32.6 31.9  MCHC 34.5 33.4  RDW 12.3 12.5  PLT 246 228    Cardiac Enzymes Recent Labs  Lab 01/04/17 2024 01/05/17 0019    TROPONINI 0.32* 0.35*    Recent Labs  Lab 01/04/17 1112  TROPIPOC 0.04     BNPNo results for input(s): BNP, PROBNP in the last 168 hours.   DDimer No results for input(s): DDIMER in the last 168 hours.   Lipid Panel  No results found for: CHOL, TRIG, HDL, CHOLHDL, VLDL, LDLCALC, LDLDIRECT   Radiology    Dg Chest 2 View  Result Date: 01/04/2017 CLINICAL DATA:  Chest pain over the last month, worsening today. EXAM: CHEST  2 VIEW COMPARISON:  03/22/2012 FINDINGS: Heart size is normal. Mediastinal shadows are normal. The patient has not taken a deep inspiration. Allowing for that, the lungs are felt to be clear. No consolidation or collapse. Vascularity is normal. No effusions. No acute bone finding. IMPRESSION: Poor inspiration.  No active disease suspected.  Electronically Signed   By: Paulina FusiMark  Shogry M.D.   On: 01/04/2017 11:47    Cardiac Studies   FINDINGS: Non-cardiac: [facility].  Calcium Score: Coronary artery calcium score of 1562 Agatston units.  Coronary Arteries: Left dominant with no anomalies  LM:  Calcified plaque distal left main, mild stenosis.  LAD system: Extensive mixed plaque in the proximal and mid LAD. I suspect moderate to severe stenosis.  Circumflex system: Small ramus, no significant disease. Mixed plaque with around 50% stenosis proximal LCx proximal to OM1. Mixed plaque with mild stenosis proximal OM1. Mixed plaque with mild stenosis mid LCx. The LCx ends in a left-sided PDA. This vessel is diffusely diseased, suspect severe stenosis.  RCA system: Nondominant vessel. Mixed plaque with moderate stenosis in the proximal RCA.  IMPRESSION: 1. Coronary artery calcium score 1562 Agatston units, placing the patient in the 99th percentile for age and gender and suggesting high risk for future cardiac events.  2. Suspect severe stenosis in the proximal to mid LAD and the left PDA. Possible moderate stenosis in the non-dominant RCA.  I think this patient needs catheterization. I will send for CT FFR to confirm.  CT FFR Results:  MEDICATIONS: None.  IMPRESSION:  FFR < 0.5 in mid LAD, suggesting severe stenosis proximal/mid LAD.  FFR 0.77 distal LCx, suggesting hemodynamically significant stenosis in the mid LCx.  Patient Profile     [name] is a 61 yo man with PMH of hyperlipidemia, hypertension, and GERD who presents with increasing frequency of chest discomfort with activity  Assessment & Plan    1. NSTEMI/UAP: Mild troponin elevation at 0.35. Multivessel plaque noted on CT with suspected significant stenosis in LAD and LCX per FFR.  No recurrent chest pain on NTG/heparin. ECG worrisome for LAD ischemia. Plan definitive cath today with possible PCI.  I  have reviewed the risks, indications, and alternatives to cardiac catheterization, possible angioplasty, and stenting with the patient. Risks include but are not limited to bleeding, infection, vascular injury, stroke, myocardial infection, arrhythmia, kidney injury, radiation-related injury in the case of prolonged fluoroscopy use, emergency cardiac surgery, and death. The patient understands the risks of serious complication is 1-2 in 1000 with diagnostic cardiac cath and 1-2% or less with angioplasty/stenting.   2.  Hyperlipidemia: now on atorvastatin 80 mg.   3. Essential hypertension: on losartan, metoprolol  4. GERD: on PPI  Signed, Lennette Biharihomas A. Kelly, MD, [facility] 01/05/2017, 8:32 AM

## 2017-01-05 NOTE — Progress Notes (Signed)
Noticed morning labs have not been drawn, notified lab, they stated they will get them drawn.

## 2017-01-05 NOTE — Progress Notes (Signed)
RN removed total of 11cc of air that was reported to be in R radial TR band. More air than reported is still in band, RN will continue to remove air from TR band per MD order.

## 2017-01-05 NOTE — Consult Note (Signed)
[address]       [place] [address]             [phone]        Logan Wells Foraker Medical Record #[id number] Date of Birth: [birth year]  Referring: No ref. provider found Primary Care: Kathyrn Lass, MD  Chief Complaint:    Chief Complaint  Patient presents with  . Chest Pain  patient examined, coronary angiogram images and 2-D echocardiogram images personally reviewed and counseled with patient and wife   History of Present Illness:      89 year old nondiabetic nonsmoker with positive family history of CAD-CABG was admitted to the ED with symptoms of unstable angina. Cardiac enzymes were negative. Cardiac catheterization performed earlier today by Dr. Martinique demonstrates severe three-vessel CAD with normal LV systolic function, normal LVEDP. The patient has a very tight 95% stenosis of the LAD-diagonal, moderate stenosis of a large OM and high-grade stenosis of a posterior descending off the dominant distal circumflex. CT of the chest was performed prior to admission which demonstrated evidence of significant CAD but otherwise  was unremarkable.   patient currently stable on IV nitroglycerin. IV heparin to start after his radial artery band is removed from the right wrist.   Current Activity/ Functional Status: Patient with very active lifestyle and a physical fitness enthusiast   Zubrod Score: At the time of surgery this patient's most appropriate activity status/level should be described as: []     0    Normal activity, no symptoms [x]     1    Restricted in physical strenuous activity but ambulatory, able to do out light work []     2    Ambulatory and capable of self care, unable to do work activities, up and about                 more than 50%  Of the time                            []     3    Only limited self care, in bed greater than 50% of waking hours []     4    Completely disabled, no self care, confined to bed or chair []     5     Moribund  Past Medical History:  Diagnosis Date  . Arthritis   . Chest pain 11/19/2016  . GERD (gastroesophageal reflux disease)   . High cholesterol   . Hypertension     Past Surgical History:  Procedure Laterality Date  . LEFT HEART CATH AND CORONARY ANGIOGRAPHY N/A 01/05/2017   Procedure: LEFT HEART CATH AND CORONARY ANGIOGRAPHY;  Surgeon: Martinique, Peter M, MD;  Location: [facility];  Service: Cardiovascular;  Laterality: N/A;  . SHOULDER SURGERY     Left    Social History   Tobacco Use  Smoking Status Never Smoker  Smokeless Tobacco Never Used    Social History   Substance and Sexual Activity  Alcohol Use Yes  . Alcohol/week: 1.2 oz  . Types: 2 Glasses of wine per week   Comment: daily    Social History   Socioeconomic History  . Marital status: Married    Spouse name: Not on file  . Number of children: Not on file  . Years of education: Not on file  . Highest education level: Not on file  Social Needs  . Financial resource strain:  Not on file  . Food insecurity - worry: Not on file  . Food insecurity - inability: Not on file  . Transportation needs - medical: Not on file  . Transportation needs - non-medical: Not on file  Occupational History  . Not on file  Tobacco Use  . Smoking status: Never Smoker  . Smokeless tobacco: Never Used  Substance and Sexual Activity  . Alcohol use: Yes    Alcohol/week: 1.2 oz    Types: 2 Glasses of wine per week    Comment: daily  . Drug use: No  . Sexual activity: Not on file  Other Topics Concern  . Not on file  Social History Narrative  . Not on file    No Known Allergies  Current Facility-Administered Medications  Medication Dose Route Frequency Provider Last Rate Last Dose  . 0.9 %  sodium chloride infusion  250 mL Intravenous PRN Kilroy, Luke K, PA-C      . 0.9 %  sodium chloride infusion  250 mL Intravenous PRN Martinique, Peter M, MD      . 0.9% sodium chloride infusion  1 mL/kg/hr Intravenous  Continuous Martinique, Peter M, MD      . acetaminophen (TYLENOL) tablet 650 mg  650 mg Oral Q4H PRN Erlene Quan, PA-C   650 mg at 01/05/17 1610  . alum & mag hydroxide-simeth (MAALOX/MYLANTA) 200-200-20 MG/5ML suspension 15 mL  15 mL Oral Q6H PRN Erlene Quan, PA-C      . aspirin EC tablet 81 mg  81 mg Oral Daily Rumbarger, Valeda Malm, RPH   81 mg at 01/05/17 0951  . atorvastatin (LIPITOR) tablet 80 mg  80 mg Oral q1800 Buford Dresser, MD      . bisacodyl (DULCOLAX) EC tablet 5 mg  5 mg Oral Once Prescott Gum, Collier Salina, MD      . calcium carbonate (TUMS - dosed in mg elemental calcium) chewable tablet 200 mg of elemental calcium  1 tablet Oral Daily PRN Kilroy, Luke K, PA-C      . chlorhexidine (HIBICLENS) 4 % liquid 4 application  60 mL Topical Once Prescott Gum, Collier Salina, MD       And  . Derrill Memo ON 01/06/2017] chlorhexidine (HIBICLENS) 4 % liquid 4 application  60 mL Topical Once Prescott Gum, Collier Salina, MD      . Derrill Memo ON 01/06/2017] chlorhexidine (PERIDEX) 0.12 % solution 15 mL  15 mL Mouth/Throat Once Prescott Gum, Collier Salina, MD      . Derrill Memo ON 01/06/2017] diazepam (VALIUM) tablet 5 mg  5 mg Oral Once Prescott Gum, Collier Salina, MD      . heparin ADULT infusion 100 units/mL (25000 units/281m sodium chloride 0.45%)  1,000 Units/hr Intravenous Continuous MKris Mouton RPH      . losartan (COZAAR) tablet 50 mg  50 mg Oral Daily KErlene Quan PA-C   50 mg at 01/05/17 09604 . [START ON 01/06/2017] metoprolol tartrate (LOPRESSOR) tablet 12.5 mg  12.5 mg Oral Once VPrescott Gum PCollier Salina MD      . metoprolol tartrate (LOPRESSOR) tablet 25 mg  25 mg Oral BID CBuford Dresser MD   25 mg at 01/05/17 0951  . nitroGLYCERIN 50 mg in dextrose 5 % 250 mL (0.2 mg/mL) infusion  0-200 mcg/min Intravenous Once CBuford Dresser MD 0.5 mL/hr at 01/04/17 2017 1.5 mcg/min at 01/04/17 2017  . ondansetron (ZOFRAN) injection 4 mg  4 mg Intravenous Q6H PRN Kilroy, Luke K, PA-C      . pantoprazole (PROTONIX)  EC tablet 40 mg  40 mg  Oral Q0600 Erlene Quan, PA-C   40 mg at 01/05/17 0540  . sodium chloride flush (NS) 0.9 % injection 3 mL  3 mL Intravenous Q12H Martinique, Peter M, MD      . sodium chloride flush (NS) 0.9 % injection 3 mL  3 mL Intravenous PRN Martinique, Peter M, MD      . zolpidem [facility]) tablet 5 mg  5 mg Oral QHS PRN,MR X 1 Erlene Quan, PA-C   5 mg at 01/04/17 2157    Medications Prior to Admission  Medication Sig Dispense Refill Last Dose  . alum & mag hydroxide-simeth (MAALOX/MYLANTA) 200-200-20 MG/5ML suspension Take 15 mLs by mouth every 6 (six) hours as needed for indigestion or heartburn.   01/04/2017 at Unknown time  . calcium carbonate (TUMS - DOSED IN MG ELEMENTAL CALCIUM) 500 MG chewable tablet Chew 1 tablet by mouth daily as needed for indigestion or heartburn.   01/04/2017 at Unknown time  . esomeprazole (NEXIUM) 10 MG packet Take 10 mg by mouth daily before breakfast.   01/03/2017 at Unknown time  . ibuprofen (ADVIL,MOTRIN) 200 MG tablet Take 600 mg by mouth every 6 (six) hours as needed.   Past Week at Unknown time  . losartan (COZAAR) 50 MG tablet Take 50 mg by mouth.   01/03/2017 at Unknown time  . nitroGLYCERIN (NITROSTAT) 0.4 MG SL tablet Place 0.4 mg under the tongue every 5 (five) minutes as needed for chest pain.   unknown  . simvastatin (ZOCOR) 40 MG tablet Take 40 mg by mouth daily at 6 PM.    01/03/2017 at Unknown time  . metoprolol tartrate (LOPRESSOR) 50 MG tablet Take 1 tablet (50 mg total) by mouth once for 1 dose. 1 tablet 0     Family History  Problem Relation Age of Onset  . Heart disease Mother   . Heart attack Mother 49  . CAD Mother   . Multiple myeloma Father      Review of Systems:       Cardiac Review of Systems: Y or N  Chest Pain [  yes    ]  Resting SOB [   ] Exertional SOB  [place].Blacker   ]  Orthopnea [  ]   Pedal Edema [   ]    Palpitations [  ] Syncope  [  ]   Presyncope [   ]  General Review of Systems: [Y] = yes [  ]=no Constitional: recent weight change [  ];  anorexia [  ]; fatigue [  ]; nausea [  ]; night sweats [  ]; fever [  ]; or chills [  ]                                                               Dental: poor dentition[  ]; Last Dentist visit:  1 year  Eye : blurred vision [  ]; diplopia [   ]; vision changes [  ];  Amaurosis fugax[  ]; Resp: cough [  ];  wheezing[  ];  hemoptysis[  ]; shortness of breath[  ]; paroxysmal nocturnal dyspnea[  ]; dyspnea on exertion[  yes  ]; or orthopnea[  ];  GI:  gallstones[  ],  vomiting[  ];  dysphagia[  ]; melena[  ];  hematochezia [  ]; heartburn[   yes ];   Hx of  Colonoscopy[  ]; GU: kidney stones [  ]; hematuria[  ];   dysuria [  ];  nocturia[  ];  history of     obstruction [  ]; urinary frequency [  ]             Skin: rash, swelling[  ];, hair loss[  ];  peripheral edema[  ];  or itching[  ]; Musculosketetal: myalgias[  ];  joint swelling[  ];  joint erythema[  ];  joint pain[  ];  back pain[  ]; left shoulder replacement   Heme/Lymph: bruising[  ];  bleeding[  ];  anemia[  ];  Neuro: TIA[  ];  headaches[  ];  stroke[  ];  vertigo[  ];  seizures[  ];   paresthesias[  ];  difficulty walking[  ]; right-hand dominant   Psych:depression[  ]; anxiety[  ];  Endocrine: diabetes[  ];  thyroid dysfunction[  ];  Immunizations: Flu [  ]; Pneumococcal[  ];  Other: no previous thoracic injuries or trauma  Physical Exam: BP 114/85 (BP Location: Left Arm)   Pulse 64   Temp 97.7 F (36.5 C) (Oral)   Resp 14   Ht 5' 10"  (1.778 m)   Wt 171 lb 15.3 oz (78 kg)   SpO2 95%   BMI 24.67 kg/m        Physical Exam  General: Well-nourished well-developed middle-aged white male no acute distress HEENT: Normocephalic pupils equal , dentition adequate Neck: Supple without JVD, adenopathy, or bruit Chest: Clear to auscultation, symmetrical breath sounds, no rhonchi, no tenderness             or deformity Cardiovascular: Regular rate and rhythm, no murmur, no gallop, peripheral pulses             palpable in all  extremities Abdomen:  Soft, nontender, no palpable mass or organomegaly Extremities: Warm, well-perfused, no clubbing cyanosis edema or tenderness,              no venous stasis changes of the legs. Compression device over right wrist from radial artery catheterization Rectal/GU: Deferred Neuro: Grossly non--focal and symmetrical throughout Skin: Clean and dry without rash or ulceration   Diagnostic Studies & Laboratory data:     Recent Radiology Findings:   Dg Chest 2 View  Result Date: 01/04/2017 CLINICAL DATA:  Chest pain over the last month, worsening today. EXAM: CHEST  2 VIEW COMPARISON:  03/22/2012 FINDINGS: Heart size is normal. Mediastinal shadows are normal. The patient has not taken a deep inspiration. Allowing for that, the lungs are felt to be clear. No consolidation or collapse. Vascularity is normal. No effusions. No acute bone finding. IMPRESSION: Poor inspiration.  No active disease suspected. Electronically Signed   By: Nelson Chimes M.D.   On: 01/04/2017 11:47     I have independently reviewed the above radiologic studies.  Recent Lab Findings: Lab Results  Component Value Date   WBC 5.6 01/05/2017   HGB 15.2 01/05/2017   HCT 45.5 01/05/2017   PLT 228 01/05/2017   GLUCOSE 106 (H) 01/05/2017   CHOL 184 01/05/2017   TRIG 80 01/05/2017   HDL 67 01/05/2017   LDLCALC 101 (H) 01/05/2017   ALT 37 01/05/2017   AST 32 01/05/2017   NA 137 01/05/2017   K 4.1 01/05/2017   CL 101  01/05/2017   CREATININE 1.00 01/05/2017   BUN 11 01/05/2017   CO2 28 01/05/2017   INR 1.09 01/04/2017   HGBA1C 5.5 01/05/2017      Assessment / Plan:     unstable angina    severe three-vessel coronary artery disease with 95% stenosis of the LAD-diagonal, preserved LV systolic function    History of hypertension   patient would benefit from multivessel CABG which we scheduled for a.m. December 11. I discussed the details of the procedure with the patient and his wife including  indications benefits alternatives and risks. He agrees to proceed with surgery-multivessel coronary bypass grafting.          @ME1 @ 01/05/2017 5:23 PM

## 2017-01-06 ENCOUNTER — Inpatient Hospital Stay (HOSPITAL_COMMUNITY): Payer: 59

## 2017-01-06 ENCOUNTER — Inpatient Hospital Stay (HOSPITAL_COMMUNITY): Payer: 59 | Admitting: Anesthesiology

## 2017-01-06 ENCOUNTER — Encounter (HOSPITAL_COMMUNITY): Payer: Self-pay | Admitting: Certified Registered"

## 2017-01-06 ENCOUNTER — Inpatient Hospital Stay (HOSPITAL_COMMUNITY)
Admission: EM | Disposition: A | Discharge status: Home / Self Care | Payer: Self-pay | Source: Home / Self Care | Attending: Cardiology

## 2017-01-06 HISTORY — PX: TEE WITHOUT CARDIOVERSION: SHX5443

## 2017-01-06 HISTORY — PX: CORONARY ARTERY BYPASS GRAFT: SHX141

## 2017-01-06 LAB — POCT I-STAT 3, ART BLOOD GAS (G3+)
ACID-BASE DEFICIT: 5 mmol/L — AB (ref 0.0–2.0)
ACID-BASE DEFICIT: 5 mmol/L — AB (ref 0.0–2.0)
ACID-BASE EXCESS: 1 mmol/L (ref 0.0–2.0)
Acid-Base Excess: 1 mmol/L (ref 0.0–2.0)
Acid-base deficit: 2 mmol/L (ref 0.0–2.0)
BICARBONATE: 26 mmol/L (ref 20.0–28.0)
Bicarbonate: 26.4 mmol/L (ref 20.0–28.0)
Bicarbonate: 22.7 mmol/L (ref 20.0–28.0)
Bicarbonate: 21.9 mmol/L (ref 20.0–28.0)
Bicarbonate: 21.1 mmol/L (ref 20.0–28.0)
O2 SAT: 100 %
O2 SAT: 99 %
O2 SAT: 96 %
O2 Saturation: 100 %
O2 Saturation: 97 %
PCO2 ART: 40.3 mmHg (ref 32.0–48.0)
PCO2 ART: 36.1 mmHg (ref 32.0–48.0)
PH ART: 7.369 (ref 7.350–7.450)
PO2 ART: 277 mmHg — AB (ref 83.0–108.0)
PO2 ART: 114 mmHg — AB (ref 83.0–108.0)
PO2 ART: 101 mmHg (ref 83.0–108.0)
Patient temperature: 35.7
Patient temperature: 36.8
TCO2: 28 mmol/L (ref 22–32)
TCO2: 27 mmol/L (ref 22–32)
TCO2: 24 mmol/L (ref 22–32)
TCO2: 23 mmol/L (ref 22–32)
TCO2: 22 mmol/L (ref 22–32)
pCO2 arterial: 45.8 mmHg (ref 32.0–48.0)
pCO2 arterial: 45.4 mmHg (ref 32.0–48.0)
pCO2 arterial: 41.1 mmHg (ref 32.0–48.0)
pH, Arterial: 7.418 (ref 7.350–7.450)
pH, Arterial: 7.401 (ref 7.350–7.450)
pH, Arterial: 7.29 — ABNORMAL LOW (ref 7.350–7.450)
pH, Arterial: 7.317 — ABNORMAL LOW (ref 7.350–7.450)
pO2, Arterial: 299 mmHg — ABNORMAL HIGH (ref 83.0–108.0)
pO2, Arterial: 86 mmHg (ref 83.0–108.0)

## 2017-01-06 LAB — GLUCOSE, CAPILLARY
GLUCOSE-CAPILLARY: 109 mg/dL — AB (ref 65–99)
GLUCOSE-CAPILLARY: 103 mg/dL — AB (ref 65–99)
GLUCOSE-CAPILLARY: 109 mg/dL — AB (ref 65–99)
GLUCOSE-CAPILLARY: 119 mg/dL — AB (ref 65–99)
Glucose-Capillary: 117 mg/dL — ABNORMAL HIGH (ref 65–99)

## 2017-01-06 LAB — POCT I-STAT, CHEM 8
BUN: 9 mg/dL (ref 6–20)
BUN: 8 mg/dL (ref 6–20)
BUN: 9 mg/dL (ref 6–20)
BUN: 8 mg/dL (ref 6–20)
BUN: 8 mg/dL (ref 6–20)
BUN: 8 mg/dL (ref 6–20)
BUN: 8 mg/dL (ref 6–20)
CALCIUM ION: 1.2 mmol/L (ref 1.15–1.40)
CALCIUM ION: 1.1 mmol/L — AB (ref 1.15–1.40)
CALCIUM ION: 1.1 mmol/L — AB (ref 1.15–1.40)
CALCIUM ION: 1.2 mmol/L (ref 1.15–1.40)
CHLORIDE: 104 mmol/L (ref 101–111)
CREATININE: 0.8 mg/dL (ref 0.61–1.24)
Calcium, Ion: 1.22 mmol/L (ref 1.15–1.40)
Calcium, Ion: 1.09 mmol/L — ABNORMAL LOW (ref 1.15–1.40)
Calcium, Ion: 1.08 mmol/L — ABNORMAL LOW (ref 1.15–1.40)
Chloride: 107 mmol/L (ref 101–111)
Chloride: 103 mmol/L (ref 101–111)
Chloride: 105 mmol/L (ref 101–111)
Chloride: 103 mmol/L (ref 101–111)
Chloride: 103 mmol/L (ref 101–111)
Chloride: 107 mmol/L (ref 101–111)
Creatinine, Ser: 0.6 mg/dL — ABNORMAL LOW (ref 0.61–1.24)
Creatinine, Ser: 0.6 mg/dL — ABNORMAL LOW (ref 0.61–1.24)
Creatinine, Ser: 0.7 mg/dL (ref 0.61–1.24)
Creatinine, Ser: 0.7 mg/dL (ref 0.61–1.24)
Creatinine, Ser: 0.7 mg/dL (ref 0.61–1.24)
Creatinine, Ser: 0.8 mg/dL (ref 0.61–1.24)
GLUCOSE: 98 mg/dL (ref 65–99)
GLUCOSE: 106 mg/dL — AB (ref 65–99)
GLUCOSE: 113 mg/dL — AB (ref 65–99)
GLUCOSE: 125 mg/dL — AB (ref 65–99)
Glucose, Bld: 111 mg/dL — ABNORMAL HIGH (ref 65–99)
Glucose, Bld: 114 mg/dL — ABNORMAL HIGH (ref 65–99)
Glucose, Bld: 128 mg/dL — ABNORMAL HIGH (ref 65–99)
HCT: 40 % (ref 39.0–52.0)
HCT: 33 % — ABNORMAL LOW (ref 39.0–52.0)
HCT: 35 % — ABNORMAL LOW (ref 39.0–52.0)
HEMATOCRIT: 39 % (ref 39.0–52.0)
HEMATOCRIT: 31 % — AB (ref 39.0–52.0)
HEMATOCRIT: 32 % — AB (ref 39.0–52.0)
HEMATOCRIT: 29 % — AB (ref 39.0–52.0)
HEMOGLOBIN: 13.6 g/dL (ref 13.0–17.0)
HEMOGLOBIN: 11.2 g/dL — AB (ref 13.0–17.0)
HEMOGLOBIN: 13.3 g/dL (ref 13.0–17.0)
HEMOGLOBIN: 10.5 g/dL — AB (ref 13.0–17.0)
HEMOGLOBIN: 11.9 g/dL — AB (ref 13.0–17.0)
Hemoglobin: 10.9 g/dL — ABNORMAL LOW (ref 13.0–17.0)
Hemoglobin: 9.9 g/dL — ABNORMAL LOW (ref 13.0–17.0)
POTASSIUM: 3.7 mmol/L (ref 3.5–5.1)
POTASSIUM: 4.5 mmol/L (ref 3.5–5.1)
POTASSIUM: 4.8 mmol/L (ref 3.5–5.1)
Potassium: 3.7 mmol/L (ref 3.5–5.1)
Potassium: 4 mmol/L (ref 3.5–5.1)
Potassium: 3.8 mmol/L (ref 3.5–5.1)
Potassium: 4.4 mmol/L (ref 3.5–5.1)
SODIUM: 141 mmol/L (ref 135–145)
SODIUM: 139 mmol/L (ref 135–145)
SODIUM: 139 mmol/L (ref 135–145)
SODIUM: 139 mmol/L (ref 135–145)
Sodium: 141 mmol/L (ref 135–145)
Sodium: 141 mmol/L (ref 135–145)
Sodium: 142 mmol/L (ref 135–145)
TCO2: 28 mmol/L (ref 22–32)
TCO2: 29 mmol/L (ref 22–32)
TCO2: 29 mmol/L (ref 22–32)
TCO2: 26 mmol/L (ref 22–32)
TCO2: 27 mmol/L (ref 22–32)
TCO2: 27 mmol/L (ref 22–32)
TCO2: 25 mmol/L (ref 22–32)

## 2017-01-06 LAB — BASIC METABOLIC PANEL
Anion gap: 7 (ref 5–15)
BUN: 11 mg/dL (ref 6–20)
CO2: 27 mmol/L (ref 22–32)
Calcium: 8.9 mg/dL (ref 8.9–10.3)
Chloride: 103 mmol/L (ref 101–111)
Creatinine, Ser: 1.02 mg/dL (ref 0.61–1.24)
GFR calc Af Amer: >60 mL/min (ref >60)
GFR calc non Af Amer: >60 mL/min (ref >60)
Glucose, Bld: 94 mg/dL (ref 65–99)
Potassium: 3.4 mmol/L — ABNORMAL LOW (ref 3.5–5.1)
Sodium: 137 mmol/L (ref 135–145)

## 2017-01-06 LAB — PROTIME-INR
INR: 1.09
INR: 1.32
Prothrombin Time: 14 seconds (ref 11.4–15.2)
Prothrombin Time: 16.3 seconds — ABNORMAL HIGH (ref 11.4–15.2)

## 2017-01-06 LAB — CBC
HCT: 36.5 % — ABNORMAL LOW (ref 39.0–52.0)
HEMATOCRIT: 44.4 % (ref 39.0–52.0)
HEMATOCRIT: 36.7 % — AB (ref 39.0–52.0)
HEMOGLOBIN: 14.8 g/dL (ref 13.0–17.0)
Hemoglobin: 12.2 g/dL — ABNORMAL LOW (ref 13.0–17.0)
Hemoglobin: 12.2 g/dL — ABNORMAL LOW (ref 13.0–17.0)
MCH: 31.7 pg (ref 26.0–34.0)
MCH: 31 pg (ref 26.0–34.0)
MCH: 31.1 pg (ref 26.0–34.0)
MCHC: 33.3 g/dL (ref 30.0–36.0)
MCHC: 33.2 g/dL (ref 30.0–36.0)
MCHC: 33.4 g/dL (ref 30.0–36.0)
MCV: 95.1 fL (ref 78.0–100.0)
MCV: 93.4 fL (ref 78.0–100.0)
MCV: 93.1 fL (ref 78.0–100.0)
PLATELETS: 149 10*3/uL — AB (ref 150–400)
Platelets: 234 10*3/uL (ref 150–400)
Platelets: 145 10*3/uL — ABNORMAL LOW (ref 150–400)
RBC: 4.67 MIL/uL (ref 4.22–5.81)
RBC: 3.93 MIL/uL — ABNORMAL LOW (ref 4.22–5.81)
RBC: 3.92 MIL/uL — ABNORMAL LOW (ref 4.22–5.81)
RDW: 12.6 % (ref 11.5–15.5)
RDW: 12.1 % (ref 11.5–15.5)
RDW: 12.1 % (ref 11.5–15.5)
WBC: 7.6 10*3/uL (ref 4.0–10.5)
WBC: 12 10*3/uL — ABNORMAL HIGH (ref 4.0–10.5)
WBC: 11.2 10*3/uL — ABNORMAL HIGH (ref 4.0–10.5)

## 2017-01-06 LAB — MAGNESIUM: Magnesium: 2.7 mg/dL — ABNORMAL HIGH (ref 1.7–2.4)

## 2017-01-06 LAB — BLOOD GAS, ARTERIAL
Acid-Base Excess: 1.7 mmol/L (ref 0.0–2.0)
Bicarbonate: 26.3 mmol/L (ref 20.0–28.0)
Drawn by: 270271
FIO2: 21
O2 Saturation: 93.9 %
Patient temperature: 98.6
pCO2 arterial: 45.8 mmHg (ref 32.0–48.0)
pH, Arterial: 7.378 (ref 7.350–7.450)
pO2, Arterial: 72.9 mmHg — ABNORMAL LOW (ref 83.0–108.0)

## 2017-01-06 LAB — HEMOGLOBIN AND HEMATOCRIT, BLOOD
HCT: 33 % — ABNORMAL LOW (ref 39.0–52.0)
Hemoglobin: 11.1 g/dL — ABNORMAL LOW (ref 13.0–17.0)

## 2017-01-06 LAB — APTT: APTT: 31 s (ref 24–36)

## 2017-01-06 LAB — ECHO TEE
Ao-asc: 31 cm
FS: 22 % — AB (ref 28–44)
LV PW d: 13.2 mm — AB (ref 0.6–1.1)
LV dias vol index: 38 mL/m2
LV dias vol: 74 mL (ref 62–150)
LV sys vol index: 21 mL/m2
LV sys vol: 42 mL
Simpson's disk: 44
Stroke v: 32 ml

## 2017-01-06 LAB — POCT I-STAT 4, (NA,K, GLUC, HGB,HCT)
Glucose, Bld: 122 mg/dL — ABNORMAL HIGH (ref 65–99)
HEMATOCRIT: 35 % — AB (ref 39.0–52.0)
Hemoglobin: 11.9 g/dL — ABNORMAL LOW (ref 13.0–17.0)
Potassium: 3.6 mmol/L (ref 3.5–5.1)
Sodium: 144 mmol/L (ref 135–145)

## 2017-01-06 LAB — CREATININE, SERUM
Creatinine, Ser: 0.96 mg/dL (ref 0.61–1.24)
GFR calc Af Amer: >60 mL/min (ref >60)
GFR calc non Af Amer: >60 mL/min (ref >60)

## 2017-01-06 LAB — PLATELET COUNT: Platelets: 145 10*3/uL — ABNORMAL LOW (ref 150–400)

## 2017-01-06 LAB — HEPARIN LEVEL (UNFRACTIONATED): Heparin Unfractionated: 0.22 IU/mL — ABNORMAL LOW (ref 0.30–0.70)

## 2017-01-06 SURGERY — CORONARY ARTERY BYPASS GRAFTING (CABG)
Anesthesia: General | Site: Chest

## 2017-01-06 MED ORDER — EPHEDRINE 5 MG/ML INJ
INTRAVENOUS | Status: AC
  Filled 2017-01-06: qty 10

## 2017-01-06 MED ORDER — HEMOSTATIC AGENTS (NO CHARGE) OPTIME
TOPICAL | Status: DC | PRN
  Administered 2017-01-06 (×2): 1 via TOPICAL

## 2017-01-06 MED ORDER — INSULIN REGULAR BOLUS VIA INFUSION
0.0000 [IU] | Freq: Three times a day (TID) | INTRAVENOUS | Status: DC
  Filled 2017-01-06: qty 10

## 2017-01-06 MED ORDER — POTASSIUM CHLORIDE 10 MEQ/50ML IV SOLN
10.0000 meq | INTRAVENOUS | Status: AC
  Administered 2017-01-06 (×3): 10 meq via INTRAVENOUS

## 2017-01-06 MED ORDER — HEPARIN SODIUM (PORCINE) 1000 UNIT/ML IJ SOLN
INTRAMUSCULAR | Status: AC
  Filled 2017-01-06: qty 1

## 2017-01-06 MED ORDER — PROTAMINE SULFATE 10 MG/ML IV SOLN
INTRAVENOUS | Status: DC | PRN
  Administered 2017-01-06: 250 mg via INTRAVENOUS

## 2017-01-06 MED ORDER — ACETAMINOPHEN 650 MG RE SUPP
650.0000 mg | Freq: Once | RECTAL | Status: AC
  Administered 2017-01-06: 650 mg via RECTAL

## 2017-01-06 MED ORDER — NOREPINEPHRINE BITARTRATE 1 MG/ML IV SOLN
INTRAVENOUS | Status: DC | PRN
  Administered 2017-01-06: 4 ug/min via INTRAVENOUS

## 2017-01-06 MED ORDER — DEXMEDETOMIDINE HCL IN NACL 400 MCG/100ML IV SOLN
0.0000 ug/kg/h | INTRAVENOUS | Status: DC
  Administered 2017-01-06: 0.5 ug/kg/h via INTRAVENOUS
  Filled 2017-01-06: qty 100

## 2017-01-06 MED ORDER — ROCURONIUM BROMIDE 10 MG/ML (PF) SYRINGE
PREFILLED_SYRINGE | INTRAVENOUS | Status: AC
  Filled 2017-01-06: qty 5

## 2017-01-06 MED ORDER — ONDANSETRON HCL 4 MG/2ML IJ SOLN
INTRAMUSCULAR | Status: AC
  Filled 2017-01-06: qty 2

## 2017-01-06 MED ORDER — MILRINONE LACTATE IN DEXTROSE 20-5 MG/100ML-% IV SOLN
0.1250 ug/kg/min | INTRAVENOUS | Status: DC
  Filled 2017-01-06: qty 100

## 2017-01-06 MED ORDER — PHENYLEPHRINE HCL 10 MG/ML IJ SOLN
INTRAMUSCULAR | Status: DC | PRN
  Administered 2017-01-06: 120 ug via INTRAVENOUS

## 2017-01-06 MED ORDER — MILRINONE LACTATE IN DEXTROSE 20-5 MG/100ML-% IV SOLN
INTRAVENOUS | Status: DC | PRN
Start: 2017-01-06 — End: 2017-01-06
  Administered 2017-01-06: 0.25 ug/kg/min via INTRAVENOUS

## 2017-01-06 MED ORDER — LACTATED RINGERS IV SOLN
500.0000 mL | Freq: Once | INTRAVENOUS | Status: AC | PRN
  Administered 2017-01-06: 500 mL via INTRAVENOUS

## 2017-01-06 MED ORDER — SODIUM CHLORIDE 0.9 % IV SOLN: 0.0000 ug/min | INTRAVENOUS | Status: DC

## 2017-01-06 MED ORDER — INSULIN ASPART 100 UNIT/ML ~~LOC~~ SOLN
0.0000 [IU] | SUBCUTANEOUS | Status: DC
  Administered 2017-01-06 – 2017-01-07 (×3): 2 [IU] via SUBCUTANEOUS

## 2017-01-06 MED ORDER — CALCIUM CHLORIDE 10 % IV SOLN
INTRAVENOUS | Status: AC
  Filled 2017-01-06: qty 10

## 2017-01-06 MED ORDER — FENTANYL CITRATE (PF) 250 MCG/5ML IJ SOLN
INTRAMUSCULAR | Status: AC
  Filled 2017-01-06: qty 5

## 2017-01-06 MED ORDER — LIDOCAINE 1% INJECTION FOR CIRCUMCISION
30.0000 mL | INJECTION | Freq: Once | INTRAVENOUS | Status: DC
  Filled 2017-01-06: qty 30

## 2017-01-06 MED ORDER — KETOROLAC TROMETHAMINE 15 MG/ML IJ SOLN
15.0000 mg | Freq: Four times a day (QID) | INTRAMUSCULAR | Status: AC
  Administered 2017-01-06 – 2017-01-08 (×7): 15 mg via INTRAVENOUS
  Filled 2017-01-06 (×7): qty 1

## 2017-01-06 MED ORDER — MIDAZOLAM HCL 5 MG/5ML IJ SOLN
INTRAMUSCULAR | Status: DC | PRN
  Administered 2017-01-06 (×2): 2 mg via INTRAVENOUS
  Administered 2017-01-06 (×2): 3 mg via INTRAVENOUS

## 2017-01-06 MED ORDER — FENTANYL CITRATE (PF) 250 MCG/5ML IJ SOLN
INTRAMUSCULAR | Status: DC | PRN
  Administered 2017-01-06: 150 ug via INTRAVENOUS
  Administered 2017-01-06 (×4): 250 ug via INTRAVENOUS
  Administered 2017-01-06 (×2): 50 ug via INTRAVENOUS

## 2017-01-06 MED ORDER — SODIUM CHLORIDE 0.9 % IV SOLN: INTRAVENOUS | Status: DC

## 2017-01-06 MED ORDER — ALBUMIN HUMAN 5 % IV SOLN
250.0000 mL | INTRAVENOUS | Status: AC | PRN
  Administered 2017-01-06 – 2017-01-07 (×3): 250 mL via INTRAVENOUS
  Filled 2017-01-06 (×4): qty 250

## 2017-01-06 MED ORDER — ALBUMIN HUMAN 5 % IV SOLN
INTRAVENOUS | Status: DC | PRN
  Administered 2017-01-06: 13:00:00 via INTRAVENOUS

## 2017-01-06 MED ORDER — ROCURONIUM BROMIDE 10 MG/ML (PF) SYRINGE
PREFILLED_SYRINGE | INTRAVENOUS | Status: DC | PRN
  Administered 2017-01-06: 40 mg via INTRAVENOUS
  Administered 2017-01-06: 60 mg via INTRAVENOUS
  Administered 2017-01-06 (×2): 50 mg via INTRAVENOUS

## 2017-01-06 MED ORDER — HEPARIN SODIUM (PORCINE) 1000 UNIT/ML IJ SOLN
INTRAMUSCULAR | Status: DC | PRN
  Administered 2017-01-06: 24000 [IU] via INTRAVENOUS
  Administered 2017-01-06: 2000 [IU] via INTRAVENOUS

## 2017-01-06 MED ORDER — FAMOTIDINE IN NACL 20-0.9 MG/50ML-% IV SOLN
20.0000 mg | Freq: Two times a day (BID) | INTRAVENOUS | Status: AC
  Administered 2017-01-06: 20 mg via INTRAVENOUS
  Filled 2017-01-06: qty 50

## 2017-01-06 MED ORDER — NITROGLYCERIN IN D5W 200-5 MCG/ML-% IV SOLN: 0.0000 ug/min | INTRAVENOUS | Status: DC

## 2017-01-06 MED ORDER — DEXTROSE 5 % IV SOLN
1.5000 g | Freq: Two times a day (BID) | INTRAVENOUS | Status: AC
  Administered 2017-01-06 – 2017-01-08 (×4): 1.5 g via INTRAVENOUS
  Filled 2017-01-06 (×4): qty 1.5

## 2017-01-06 MED ORDER — ORAL CARE MOUTH RINSE
15.0000 mL | Freq: Four times a day (QID) | OROMUCOSAL | Status: DC
  Administered 2017-01-06 – 2017-01-08 (×4): 15 mL via OROMUCOSAL

## 2017-01-06 MED ORDER — SODIUM CHLORIDE 0.45 % IV SOLN: INTRAVENOUS | Status: DC | PRN

## 2017-01-06 MED ORDER — DOCUSATE SODIUM 100 MG PO CAPS
200.0000 mg | ORAL_CAPSULE | Freq: Every day | ORAL | Status: DC
  Administered 2017-01-07 – 2017-01-10 (×4): 200 mg via ORAL
  Filled 2017-01-06 (×4): qty 2

## 2017-01-06 MED ORDER — BISACODYL 10 MG RE SUPP: 10.0000 mg | Freq: Every day | RECTAL | Status: DC

## 2017-01-06 MED ORDER — CHLORHEXIDINE GLUCONATE 0.12 % MT SOLN
15.0000 mL | OROMUCOSAL | Status: AC
  Administered 2017-01-06: 15 mL via OROMUCOSAL

## 2017-01-06 MED ORDER — DEXTROSE 5 % IV SOLN
0.0000 ug/min | INTRAVENOUS | Status: DC
  Administered 2017-01-07: 6 ug/min via INTRAVENOUS
  Filled 2017-01-06: qty 4

## 2017-01-06 MED ORDER — SODIUM CHLORIDE 0.9 % IV SOLN: 250.0000 mL | INTRAVENOUS | Status: DC

## 2017-01-06 MED ORDER — LIDOCAINE HCL (PF) 1 % IJ SOLN: 30.0000 mL | Freq: Once | INTRAMUSCULAR | Status: AC

## 2017-01-06 MED ORDER — BISACODYL 5 MG PO TBEC
10.0000 mg | DELAYED_RELEASE_TABLET | Freq: Every day | ORAL | Status: DC
  Administered 2017-01-07 – 2017-01-10 (×4): 10 mg via ORAL
  Filled 2017-01-06 (×4): qty 2

## 2017-01-06 MED ORDER — MILRINONE LACTATE IN DEXTROSE 20-5 MG/100ML-% IV SOLN
0.3000 ug/kg/min | INTRAVENOUS | Status: DC
  Administered 2017-01-07: 0.25 ug/kg/min via INTRAVENOUS
  Filled 2017-01-06: qty 100

## 2017-01-06 MED ORDER — LACTATED RINGERS IV SOLN
INTRAVENOUS | Status: DC | PRN
  Administered 2017-01-06: 07:00:00 via INTRAVENOUS

## 2017-01-06 MED ORDER — ACETAMINOPHEN 500 MG PO TABS
1000.0000 mg | ORAL_TABLET | Freq: Four times a day (QID) | ORAL | Status: DC
Start: 2017-01-07 — End: 2017-01-10
  Administered 2017-01-06 – 2017-01-10 (×11): 1000 mg via ORAL
  Filled 2017-01-06 (×11): qty 2

## 2017-01-06 MED ORDER — MIDAZOLAM HCL 2 MG/2ML IJ SOLN: 2.0000 mg | INTRAMUSCULAR | Status: DC | PRN

## 2017-01-06 MED ORDER — LACTATED RINGERS IV SOLN: INTRAVENOUS | Status: DC

## 2017-01-06 MED ORDER — METOPROLOL TARTRATE 5 MG/5ML IV SOLN: 2.5000 mg | INTRAVENOUS | Status: DC | PRN

## 2017-01-06 MED ORDER — LIDOCAINE HCL (PF) 1 % IJ SOLN
INTRAMUSCULAR | Status: AC
  Administered 2017-01-06: 30 mL
  Filled 2017-01-06: qty 30

## 2017-01-06 MED ORDER — FENTANYL CITRATE (PF) 100 MCG/2ML IJ SOLN
INTRAMUSCULAR | Status: AC
Start: 2017-01-06 — End: 2017-01-06
  Administered 2017-01-06: 75 ug
  Filled 2017-01-06: qty 2

## 2017-01-06 MED ORDER — PHENYLEPHRINE HCL 10 MG/ML IJ SOLN
INTRAMUSCULAR | Status: DC | PRN
  Administered 2017-01-06: 10 ug/min via INTRAVENOUS

## 2017-01-06 MED ORDER — CHLORHEXIDINE GLUCONATE CLOTH 2 % EX PADS
6.0000 | MEDICATED_PAD | Freq: Every day | CUTANEOUS | Status: DC
  Administered 2017-01-06 – 2017-01-08 (×3): 6 via TOPICAL

## 2017-01-06 MED ORDER — DEXTROSE 5 % IV SOLN
0.0000 ug/min | INTRAVENOUS | Status: DC
  Filled 2017-01-06: qty 4

## 2017-01-06 MED ORDER — METOPROLOL TARTRATE 25 MG/10 ML ORAL SUSPENSION: 12.5000 mg | Freq: Two times a day (BID) | ORAL | Status: DC

## 2017-01-06 MED ORDER — OXYCODONE HCL 5 MG PO TABS
5.0000 mg | ORAL_TABLET | ORAL | Status: DC | PRN
  Administered 2017-01-06 – 2017-01-10 (×14): 10 mg via ORAL
  Filled 2017-01-06 (×14): qty 2

## 2017-01-06 MED ORDER — CALCIUM CHLORIDE 10 % IV SOLN
INTRAVENOUS | Status: DC | PRN
  Administered 2017-01-06: 500 mg via INTRAVENOUS

## 2017-01-06 MED ORDER — MORPHINE SULFATE (PF) 4 MG/ML IV SOLN
2.0000 mg | INTRAVENOUS | Status: DC | PRN
  Administered 2017-01-06 – 2017-01-07 (×3): 2 mg via INTRAVENOUS
  Administered 2017-01-07 (×2): 4 mg via INTRAVENOUS
  Filled 2017-01-06 (×5): qty 1

## 2017-01-06 MED ORDER — FENTANYL CITRATE (PF) 100 MCG/2ML IJ SOLN: 75.0000 ug | Freq: Once | INTRAMUSCULAR | Status: AC

## 2017-01-06 MED ORDER — PROPOFOL 10 MG/ML IV BOLUS
INTRAVENOUS | Status: DC | PRN
  Administered 2017-01-06: 70 mg via INTRAVENOUS

## 2017-01-06 MED ORDER — MORPHINE SULFATE (PF) 4 MG/ML IV SOLN: 1.0000 mg | INTRAVENOUS | Status: DC | PRN

## 2017-01-06 MED ORDER — DEXTROSE 5 % IV SOLN
INTRAVENOUS | Status: DC | PRN
  Administered 2017-01-06: 80 ug/min via INTRAVENOUS

## 2017-01-06 MED ORDER — VANCOMYCIN HCL IN DEXTROSE 1-5 GM/200ML-% IV SOLN
1000.0000 mg | Freq: Once | INTRAVENOUS | Status: AC
  Administered 2017-01-06: 1000 mg via INTRAVENOUS
  Filled 2017-01-06 (×2): qty 200

## 2017-01-06 MED ORDER — ACETAMINOPHEN 160 MG/5ML PO SOLN: 650.0000 mg | Freq: Once | ORAL | Status: AC

## 2017-01-06 MED ORDER — MAGNESIUM SULFATE 4 GM/100ML IV SOLN
4.0000 g | Freq: Once | INTRAVENOUS | Status: AC
  Administered 2017-01-06: 4 g via INTRAVENOUS
  Filled 2017-01-06: qty 100

## 2017-01-06 MED ORDER — SODIUM CHLORIDE 0.9 % IV SOLN
INTRAVENOUS | Status: DC | PRN
Start: 2017-01-06 — End: 2017-01-06
  Administered 2017-01-06: 13:00:00 via INTRAVENOUS

## 2017-01-06 MED ORDER — ONDANSETRON HCL 4 MG/2ML IJ SOLN
4.0000 mg | Freq: Four times a day (QID) | INTRAMUSCULAR | Status: DC | PRN
  Administered 2017-01-06 – 2017-01-07 (×3): 4 mg via INTRAVENOUS
  Filled 2017-01-06 (×3): qty 2

## 2017-01-06 MED ORDER — SODIUM CHLORIDE 0.9 % IJ SOLN
OROMUCOSAL | Status: DC | PRN
  Administered 2017-01-06 (×3): via TOPICAL

## 2017-01-06 MED ORDER — SODIUM CHLORIDE 0.9 % IV SOLN
INTRAVENOUS | Status: DC | PRN
  Administered 2017-01-06: .8 [IU]/h via INTRAVENOUS

## 2017-01-06 MED ORDER — ASPIRIN EC 325 MG PO TBEC
325.0000 mg | DELAYED_RELEASE_TABLET | Freq: Every day | ORAL | Status: DC
  Administered 2017-01-07 – 2017-01-10 (×3): 325 mg via ORAL
  Filled 2017-01-06 (×3): qty 1

## 2017-01-06 MED ORDER — PROPOFOL 10 MG/ML IV BOLUS
INTRAVENOUS | Status: AC
  Filled 2017-01-06: qty 20

## 2017-01-06 MED ORDER — METOPROLOL TARTRATE 12.5 MG HALF TABLET
12.5000 mg | ORAL_TABLET | Freq: Two times a day (BID) | ORAL | Status: DC
  Administered 2017-01-08: 12.5 mg via ORAL
  Filled 2017-01-06 (×2): qty 1

## 2017-01-06 MED ORDER — SODIUM CHLORIDE 0.9 % IV SOLN
INTRAVENOUS | Status: DC | PRN
  Administered 2017-01-06: .4 ug/kg/h via INTRAVENOUS

## 2017-01-06 MED ORDER — CHLORHEXIDINE GLUCONATE CLOTH 2 % EX PADS: 6.0000 | MEDICATED_PAD | Freq: Every day | CUTANEOUS | Status: DC

## 2017-01-06 MED ORDER — SODIUM CHLORIDE 0.9% FLUSH
3.0000 mL | Freq: Two times a day (BID) | INTRAVENOUS | Status: DC
  Administered 2017-01-07 – 2017-01-10 (×2): 3 mL via INTRAVENOUS

## 2017-01-06 MED ORDER — SODIUM CHLORIDE 0.9% FLUSH: 3.0000 mL | INTRAVENOUS | Status: DC | PRN

## 2017-01-06 MED ORDER — LIDOCAINE 2% (20 MG/ML) 5 ML SYRINGE
INTRAMUSCULAR | Status: AC
  Filled 2017-01-06: qty 5

## 2017-01-06 MED ORDER — PHENYLEPHRINE 40 MCG/ML (10ML) SYRINGE FOR IV PUSH (FOR BLOOD PRESSURE SUPPORT)
PREFILLED_SYRINGE | INTRAVENOUS | Status: AC
  Filled 2017-01-06: qty 10

## 2017-01-06 MED ORDER — TRAMADOL HCL 50 MG PO TABS: 50.0000 mg | ORAL_TABLET | ORAL | Status: DC | PRN

## 2017-01-06 MED ORDER — SUCCINYLCHOLINE CHLORIDE 200 MG/10ML IV SOSY
PREFILLED_SYRINGE | INTRAVENOUS | Status: AC
  Filled 2017-01-06: qty 10

## 2017-01-06 MED ORDER — PROTAMINE SULFATE 10 MG/ML IV SOLN
INTRAVENOUS | Status: AC
  Filled 2017-01-06: qty 25

## 2017-01-06 MED ORDER — LACTATED RINGERS IV SOLN
INTRAVENOUS | Status: DC | PRN
  Administered 2017-01-06 (×2): via INTRAVENOUS

## 2017-01-06 MED ORDER — THROMBIN (RECOMBINANT) 5000 UNITS EX SOLR
CUTANEOUS | Status: AC
  Filled 2017-01-06: qty 5000

## 2017-01-06 MED ORDER — LACTATED RINGERS IV SOLN
INTRAVENOUS | Status: DC | PRN
  Administered 2017-01-06: 06:00:00 via INTRAVENOUS

## 2017-01-06 MED ORDER — PANTOPRAZOLE SODIUM 40 MG PO TBEC
40.0000 mg | DELAYED_RELEASE_TABLET | Freq: Every day | ORAL | Status: DC
  Administered 2017-01-08 – 2017-01-10 (×3): 40 mg via ORAL
  Filled 2017-01-06 (×3): qty 1

## 2017-01-06 MED ORDER — CHLORHEXIDINE GLUCONATE 0.12% ORAL RINSE (MEDLINE KIT)
15.0000 mL | Freq: Two times a day (BID) | OROMUCOSAL | Status: DC
  Administered 2017-01-06 – 2017-01-07 (×2): 15 mL via OROMUCOSAL

## 2017-01-06 MED ORDER — 0.9 % SODIUM CHLORIDE (POUR BTL) OPTIME
TOPICAL | Status: DC | PRN
  Administered 2017-01-06: 6000 mL

## 2017-01-06 MED ORDER — MUPIROCIN 2 % EX OINT
1.0000 "application " | TOPICAL_OINTMENT | Freq: Two times a day (BID) | CUTANEOUS | Status: DC
  Administered 2017-01-07 – 2017-01-09 (×4): 1 via NASAL
  Filled 2017-01-06: qty 22

## 2017-01-06 MED ORDER — ACETAMINOPHEN 160 MG/5ML PO SOLN: 1000.0000 mg | Freq: Four times a day (QID) | ORAL | Status: DC

## 2017-01-06 MED ORDER — ASPIRIN 81 MG PO CHEW
324.0000 mg | CHEWABLE_TABLET | Freq: Every day | ORAL | Status: DC
  Administered 2017-01-08: 324 mg
  Filled 2017-01-06: qty 4

## 2017-01-06 MED ORDER — MIDAZOLAM HCL 10 MG/2ML IJ SOLN
INTRAMUSCULAR | Status: AC
  Filled 2017-01-06: qty 2

## 2017-01-06 SURGICAL SUPPLY — 97 items
ADAPTER CARDIO PERF ANTE/RETRO (ADAPTER) ×4 IMPLANT
BAG DECANTER FOR FLEXI CONT (MISCELLANEOUS) ×4 IMPLANT
BANDAGE ACE 4X5 VEL STRL LF (GAUZE/BANDAGES/DRESSINGS) ×4 IMPLANT
BANDAGE ACE 6X5 VEL STRL LF (GAUZE/BANDAGES/DRESSINGS) ×4 IMPLANT
BASKET HEART  (ORDER IN 25'S) (MISCELLANEOUS) ×1
BASKET HEART (ORDER IN 25'S) (MISCELLANEOUS) ×1
BASKET HEART (ORDER IN 25S) (MISCELLANEOUS) ×2 IMPLANT
BLADE CLIPPER SURG (BLADE) IMPLANT
BLADE NEEDLE 3 SS STRL (BLADE) ×3 IMPLANT
BLADE NEEDLE 3MM SS STRL (BLADE) ×1
BLADE STERNUM SYSTEM 6 (BLADE) ×4 IMPLANT
BLADE SURG 12 STRL SS (BLADE) ×4 IMPLANT
BNDG GAUZE ELAST 4 BULKY (GAUZE/BANDAGES/DRESSINGS) ×4 IMPLANT
CANISTER SUCT 3000ML PPV (MISCELLANEOUS) ×4 IMPLANT
CANNULA GUNDRY RCSP 15FR (MISCELLANEOUS) ×4 IMPLANT
CATH CPB KIT VANTRIGT (MISCELLANEOUS) ×4 IMPLANT
CATH ROBINSON RED A/P 18FR (CATHETERS) ×12 IMPLANT
CATH THORACIC 36FR RT ANG (CATHETERS) ×4 IMPLANT
CLIP FOGARTY SPRING 6M (CLIP) ×4 IMPLANT
CLIP VESOCCLUDE SM WIDE 24/CT (CLIP) ×8 IMPLANT
CRADLE DONUT ADULT HEAD (MISCELLANEOUS) ×4 IMPLANT
DRAIN CHANNEL 32F RND 10.7 FF (WOUND CARE) ×4 IMPLANT
DRAPE CARDIOVASCULAR INCISE (DRAPES) ×2
DRAPE SLUSH/WARMER DISC (DRAPES) ×4 IMPLANT
DRAPE SRG 135X102X78XABS (DRAPES) ×2 IMPLANT
DRSG AQUACEL AG ADV 3.5X14 (GAUZE/BANDAGES/DRESSINGS) ×4 IMPLANT
ELECT BLADE 4.0 EZ CLEAN MEGAD (MISCELLANEOUS) ×4
ELECT BLADE 6.5 EXT (BLADE) ×4 IMPLANT
ELECT CAUTERY BLADE 6.4 (BLADE) ×4 IMPLANT
ELECT REM PT RETURN 9FT ADLT (ELECTROSURGICAL) ×8
ELECTRODE BLDE 4.0 EZ CLN MEGD (MISCELLANEOUS) ×2 IMPLANT
ELECTRODE REM PT RTRN 9FT ADLT (ELECTROSURGICAL) ×4 IMPLANT
FELT TEFLON 1X6 (MISCELLANEOUS) ×4 IMPLANT
GAUZE SPONGE 4X4 12PLY STRL (GAUZE/BANDAGES/DRESSINGS) ×8 IMPLANT
GAUZE SPONGE 4X4 12PLY STRL LF (GAUZE/BANDAGES/DRESSINGS) ×8 IMPLANT
GLOVE BIO SURGEON STRL SZ7.5 (GLOVE) ×12 IMPLANT
GOWN STRL REUS W/ TWL LRG LVL3 (GOWN DISPOSABLE) ×8 IMPLANT
GOWN STRL REUS W/TWL LRG LVL3 (GOWN DISPOSABLE) ×8
HEMOSTAT POWDER SURGIFOAM 1G (HEMOSTASIS) ×12 IMPLANT
HEMOSTAT SURGICEL 2X14 (HEMOSTASIS) ×4 IMPLANT
INSERT FOGARTY XLG (MISCELLANEOUS) IMPLANT
KIT BASIN OR (CUSTOM PROCEDURE TRAY) ×4 IMPLANT
KIT ROOM TURNOVER OR (KITS) ×4 IMPLANT
KIT SUCTION CATH 14FR (SUCTIONS) ×4 IMPLANT
KIT VASOVIEW HEMOPRO VH 3000 (KITS) ×4 IMPLANT
LEAD PACING MYOCARDI (MISCELLANEOUS) ×4 IMPLANT
MARKER GRAFT CORONARY BYPASS (MISCELLANEOUS) ×12 IMPLANT
NS IRRIG 1000ML POUR BTL (IV SOLUTION) ×20 IMPLANT
PACK E OPEN HEART (SUTURE) ×4 IMPLANT
PACK OPEN HEART (CUSTOM PROCEDURE TRAY) ×4 IMPLANT
PAD ARMBOARD 7.5X6 YLW CONV (MISCELLANEOUS) ×8 IMPLANT
PAD ELECT DEFIB RADIOL ZOLL (MISCELLANEOUS) ×4 IMPLANT
PENCIL BUTTON HOLSTER BLD 10FT (ELECTRODE) ×4 IMPLANT
PUNCH AORTIC ROTATE  4.5MM 8IN (MISCELLANEOUS) ×4 IMPLANT
PUNCH AORTIC ROTATE 4.0MM (MISCELLANEOUS) IMPLANT
PUNCH AORTIC ROTATE 4.5MM 8IN (MISCELLANEOUS) IMPLANT
PUNCH AORTIC ROTATE 5MM 8IN (MISCELLANEOUS) IMPLANT
SET CARDIOPLEGIA MPS 5001102 (MISCELLANEOUS) ×4 IMPLANT
SOLUTION ANTI FOG 6CC (MISCELLANEOUS) ×4 IMPLANT
SURGIFLO W/THROMBIN 8M KIT (HEMOSTASIS) ×4 IMPLANT
SUT BONE WAX W31G (SUTURE) ×4 IMPLANT
SUT MNCRL AB 4-0 PS2 18 (SUTURE) IMPLANT
SUT PROLENE 3 0 SH DA (SUTURE) IMPLANT
SUT PROLENE 3 0 SH1 36 (SUTURE) IMPLANT
SUT PROLENE 4 0 RB 1 (SUTURE) ×2
SUT PROLENE 4 0 SH DA (SUTURE) ×4 IMPLANT
SUT PROLENE 4-0 RB1 .5 CRCL 36 (SUTURE) ×2 IMPLANT
SUT PROLENE 5 0 C 1 36 (SUTURE) IMPLANT
SUT PROLENE 6 0 C 1 30 (SUTURE) ×8 IMPLANT
SUT PROLENE 6 0 CC (SUTURE) ×28 IMPLANT
SUT PROLENE 8 0 BV175 6 (SUTURE) IMPLANT
SUT PROLENE BLUE 7 0 (SUTURE) ×8 IMPLANT
SUT SILK  1 MH (SUTURE)
SUT SILK 1 MH (SUTURE) IMPLANT
SUT SILK 2 0 SH CR/8 (SUTURE) IMPLANT
SUT SILK 3 0 SH CR/8 (SUTURE) IMPLANT
SUT STEEL 6MS V (SUTURE) ×8 IMPLANT
SUT STEEL SZ 6 DBL 3X14 BALL (SUTURE) ×4 IMPLANT
SUT VIC AB 1 CTX 36 (SUTURE) ×6
SUT VIC AB 1 CTX36XBRD ANBCTR (SUTURE) ×6 IMPLANT
SUT VIC AB 2-0 CT1 27 (SUTURE) ×2
SUT VIC AB 2-0 CT1 TAPERPNT 27 (SUTURE) ×2 IMPLANT
SUT VIC AB 2-0 CTX 27 (SUTURE) IMPLANT
SUT VIC AB 3-0 X1 27 (SUTURE) ×4 IMPLANT
SYSTEM SAHARA CHEST DRAIN ATS (WOUND CARE) ×4 IMPLANT
TAPE CLOTH SURG 4X10 WHT LF (GAUZE/BANDAGES/DRESSINGS) ×4 IMPLANT
TAPE PAPER 2X10 WHT MICROPORE (GAUZE/BANDAGES/DRESSINGS) ×4 IMPLANT
TOWEL GREEN STERILE (TOWEL DISPOSABLE) ×16 IMPLANT
TOWEL GREEN STERILE FF (TOWEL DISPOSABLE) ×8 IMPLANT
TOWEL OR 17X24 6PK STRL BLUE (TOWEL DISPOSABLE) ×8 IMPLANT
TOWEL OR 17X26 10 PK STRL BLUE (TOWEL DISPOSABLE) ×8 IMPLANT
TRAY FOLEY SILVER 16FR TEMP (SET/KITS/TRAYS/PACK) ×4 IMPLANT
TUBE CONNECTING 12'X1/4 (SUCTIONS) ×1
TUBE CONNECTING 12X1/4 (SUCTIONS) ×3 IMPLANT
TUBING INSUFFLATION (TUBING) ×4 IMPLANT
UNDERPAD 30X30 (UNDERPADS AND DIAPERS) ×4 IMPLANT
WATER STERILE IRR 1000ML POUR (IV SOLUTION) ×8 IMPLANT

## 2017-01-06 NOTE — Anesthesia Procedure Notes (Signed)
Arterial Line Insertion Start/End12/11/2016 6:50 AM, 01/06/2017 6:55 AM Performed by: Julian ReilWelty, Coren Crownover F, CRNA, CRNA  Patient location: Pre-op. Preanesthetic checklist: patient identified, IV checked, site marked, risks and benefits discussed, surgical consent, monitors and equipment checked, pre-op evaluation and timeout performed Left, radial was placed Catheter size: 20 G Hand hygiene performed  and maximum sterile barriers used   Attempts: 1 Procedure performed without using ultrasound guided technique. Following insertion, Biopatch and dressing applied. Post procedure assessment: normal  Patient tolerated the procedure well with no immediate complications.

## 2017-01-06 NOTE — Brief Op Note (Signed)
01/04/2017 - 01/06/2017  12:05 PM  PATIENT:  Logan Wells  61 y.o. male  PRE-OPERATIVE DIAGNOSIS:  CAD  POST-OPERATIVE DIAGNOSIS:  CAD  PROCEDURE:  Procedure(s): CORONARY ARTERY BYPASS GRAFTING (CABG) x4 , USING LEFT INTERNAL MAMMARY ARTERY AND RIGHT GREATER SAPHENOUS VEIN HARVESTED ENDOSCOPICALLY (N/A) TRANSESOPHAGEAL ECHOCARDIOGRAM (TEE) (N/A) LIMA-LAD SVG-OM SVG-PDA SVG-DIAG  SURGEON:  Surgeon(s) and Role:    Kerin Perna* Van Trigt, Peter, MD - Primary  PHYSICIAN ASSISTANT: WAYNE GOLD PA-C  ANESTHESIA:   general  EBL:  1120 mL   BLOOD ADMINISTERED:none  DRAINS: MEDIASTINAL AND PLEURAL CHEST DRAINS   LOCAL MEDICATIONS USED:  NONE  SPECIMEN:  No Specimen  DISPOSITION OF SPECIMEN:  N/A  COUNTS:  YES  TOURNIQUET:  * No tourniquets in log *  DICTATION: .Other Dictation: Dictation Number PENDING  PLAN OF CARE: Admit to inpatient   PATIENT DISPOSITION:  ICU - intubated and hemodynamically stable.   Delay start of Pharmacological VTE agent (>24hrs) due to surgical blood loss or risk of bleeding: yes  COMPLICATIONS: NO KNOWN

## 2017-01-06 NOTE — Anesthesia Procedure Notes (Signed)
Central Venous Catheter Insertion Performed by: Gaynelle AduFitzgerald, Jaymarie Yeakel, MD, anesthesiologist Start/End12/11/2016 6:40 AM, 01/06/2017 6:55 AM Patient location: Pre-op. Preanesthetic checklist: patient identified, IV checked, site marked, risks and benefits discussed, surgical consent, monitors and equipment checked, pre-op evaluation, timeout performed and anesthesia consent Position: Trendelenburg Lidocaine 1% used for infiltration and patient sedated Hand hygiene performed , maximum sterile barriers used  and Seldinger technique used Catheter size: 8.5 Fr Total catheter length 10. Central line was placed.Sheath introducer Swan type:thermodilution Procedure performed using ultrasound guided technique. Ultrasound Notes:anatomy identified, needle tip was noted to be adjacent to the nerve/plexus identified, no ultrasound evidence of intravascular and/or intraneural injection and image(s) printed for medical record Attempts: 1 Following insertion, line sutured, dressing applied and Biopatch. Post procedure assessment: blood return through all ports, free fluid flow and no air  Patient tolerated the procedure well with no immediate complications.

## 2017-01-06 NOTE — Procedures (Signed)
Extubation Procedure Note  Patient Details:   Name: Logan Wells DOB: 1955/11/15 MRN: [id number]   Airway Documentation:     Evaluation  O2 sats: stable throughout and currently acceptable Complications: No apparent complications Patient did tolerate procedure well. Bilateral Breath Sounds: Clear   Yes   NIF -32, VC 814  Shamona Wirtz T 01/06/2017, 6:07 PM

## 2017-01-06 NOTE — Anesthesia Procedure Notes (Signed)
Procedure Name: Intubation Date/Time: 01/06/2017 8:14 AM Performed by: Julian ReilWelty, Tameia Rafferty F, CRNA Pre-anesthesia Checklist: Patient identified, Emergency Drugs available, Suction available, Patient being monitored and Timeout performed Patient Re-evaluated:Patient Re-evaluated prior to induction Oxygen Delivery Method: Circle system utilized Preoxygenation: Pre-oxygenation with 100% oxygen Induction Type: IV induction Ventilation: Mask ventilation without difficulty Laryngoscope Size: Miller and 3 Grade View: Grade I Tube type: Subglottic suction tube Tube size: 8.0 mm Number of attempts: 1 Airway Equipment and Method: Stylet Placement Confirmation: ETT inserted through vocal cords under direct vision,  positive ETCO2 and breath sounds checked- equal and bilateral Secured at: 23 cm Tube secured with: Tape Dental Injury: Teeth and Oropharynx as per pre-operative assessment

## 2017-01-06 NOTE — Anesthesia Procedure Notes (Signed)
Central Venous Catheter Insertion Performed by: Gaynelle AduFitzgerald, Blimy Napoleon, MD, anesthesiologist Start/End12/11/2016 6:40 AM, 01/06/2017 6:55 AM Patient location: Pre-op. Preanesthetic checklist: patient identified, IV checked, site marked, risks and benefits discussed, surgical consent, monitors and equipment checked, pre-op evaluation, timeout performed and anesthesia consent Hand hygiene performed  and maximum sterile barriers used  PA cath was placed.Swan type:thermodilution PA Cath depth:50 Procedure performed without using ultrasound guided technique. Attempts: 1 Patient tolerated the procedure well with no immediate complications.

## 2017-01-06 NOTE — Addendum Note (Signed)
Addendum  created 01/06/17 2148 by Kipp BroodJoslin, Garrett Bowring, MD   Diagnosis association updated

## 2017-01-06 NOTE — Progress Notes (Signed)
Report called to anesthesia holding at [phone] in anticipation of patient's first case CABG.  Notified OR holding RN regarding beta blocker, prep/clip completed and pt has been chest pain free throughout the shift.  Nitro gtt and heparin drip discontinued on call to OR @ 0520.

## 2017-01-06 NOTE — Progress Notes (Signed)
Pre Procedure note for inpatients:   Logan Wells has been scheduled for Procedure(s): CORONARY ARTERY BYPASS GRAFTING (CABG) (N/A) TRANSESOPHAGEAL ECHOCARDIOGRAM (TEE) (N/A) today. The various methods of treatment have been discussed with the patient. After consideration of the risks, benefits and treatment options the patient has consented to the planned procedure.   The patient has been seen and labs reviewed. There are no changes in the patient's condition to prevent proceeding with the planned procedure today.  Recent labs:  Lab Results  Component Value Date   WBC 7.6 01/06/2017   HGB 14.8 01/06/2017   HCT 44.4 01/06/2017   PLT 234 01/06/2017   GLUCOSE 94 01/06/2017   CHOL 184 01/05/2017   TRIG 80 01/05/2017   HDL 67 01/05/2017   LDLCALC 101 (H) 01/05/2017   ALT 31 01/05/2017   AST 30 01/05/2017   NA 137 01/06/2017   K 3.4 (L) 01/06/2017   CL 103 01/06/2017   CREATININE 1.02 01/06/2017   BUN 11 01/06/2017   CO2 27 01/06/2017   TSH 2.249 01/05/2017   INR 1.09 01/06/2017   HGBA1C 5.5 01/05/2017    Kerin PernaPeter Van Trigt III, MD 01/06/2017 7:24 AM

## 2017-01-06 NOTE — Progress Notes (Addendum)
[facility] BRIEF SICU PROGRESS NOTE  Day of Surgery  S/P Procedure(s) (LRB): CORONARY ARTERY BYPASS GRAFTING (CABG) x4 , USING LEFT INTERNAL MAMMARY ARTERY AND RIGHT GREATER SAPHENOUS VEIN HARVESTED ENDOSCOPICALLY (N/A) TRANSESOPHAGEAL ECHOCARDIOGRAM (TEE) (N/A)   Extubated uneventfully AAI paced w/ stable hemodynamics on low dose milrinone and levophed O2 sats 97% on 4 L/min Chest tube output low UOP excellent Labs okay  Plan: Continue routine early postop  Logan Nailslarence H Callan Norden, MD 01/06/2017 8:30 PM

## 2017-01-06 NOTE — Progress Notes (Signed)
ANTICOAGULATION CONSULT NOTE  Pharmacy Consult for Heparin  Indication: chest pain/ACS  No Known Allergies  Patient Measurements: Height: 5\' 10"  (177.8 cm) Weight: 171 lb 15.3 oz (78 kg) IBW/kg (Calculated) : 73  Vital Signs: Temp: 96.4 F (35.8 C) (12/10 2357) Temp Source: Axillary (12/10 2357) BP: 110/69 (12/10 2357) Pulse Rate: 61 (12/10 2357)  Labs: Recent Labs    01/04/17 1115 01/04/17 2024 01/05/17 0019 01/05/17 0808 01/06/17 0011  HGB 16.3  --   --  15.2  --   HCT 47.2  --   --  45.5  --   PLT 246  --   --  228  --   APTT 29  --   --   --   --   LABPROT 14.0  --   --   --  14.0  INR 1.09  --   --   --  1.09  HEPARINUNFRC  --  0.36  --  0.45 0.22*  CREATININE 0.90  --   --  1.00  --   TROPONINI  --  0.32* 0.35* 0.22*  --     Estimated Creatinine Clearance: 82.1 mL/min (by C-G formula based on SCr of 1 mg/dL).  Assessment: 61 y.o. male with CAD awaiting CABG for heparin Goal of Therapy:  Heparin level 0.3-0.7 units/ml Monitor platelets by anticoagulation protocol: Yes   Plan:  Increase Heparin 1100 units/hr F/U after CABG  Geannie RisenGreg Taji Sather, PharmD, BCPS  01/06/2017 1:14 AM

## 2017-01-06 NOTE — Transfer of Care (Signed)
Immediate Anesthesia Transfer of Care Note  Patient: Logan Wells  Procedure(s) Performed: CORONARY ARTERY BYPASS GRAFTING (CABG) x4 , USING LEFT INTERNAL MAMMARY ARTERY AND RIGHT GREATER SAPHENOUS VEIN HARVESTED ENDOSCOPICALLY (N/A Chest) TRANSESOPHAGEAL ECHOCARDIOGRAM (TEE) (N/A )  Patient Location: ICU  Anesthesia Type:General  Level of Consciousness: unresponsive and Patient remains intubated per anesthesia plan  Airway & Oxygen Therapy: Patient remains intubated per anesthesia plan and Patient placed on Ventilator (see vital sign flow sheet for setting)  Post-op Assessment: Report given to RN and Post -op Vital signs reviewed and stable  Post vital signs: Reviewed and stable  Last Vitals:  Vitals:   01/06/17 0352 01/06/17 0401  BP:  130/90  Pulse:  (!) 52  Resp:    Temp: (!) 36.4 C (!) 36.4 C  SpO2: 92% 92%    Last Pain:  Vitals:   01/06/17 0401  TempSrc: Oral  PainSc: 0-No pain         Complications: No apparent anesthesia complications

## 2017-01-06 NOTE — Anesthesia Postprocedure Evaluation (Signed)
Anesthesia Post Note  Patient: Logan Wells  Procedure(s) Performed: CORONARY ARTERY BYPASS GRAFTING (CABG) x4 , USING LEFT INTERNAL MAMMARY ARTERY AND RIGHT GREATER SAPHENOUS VEIN HARVESTED ENDOSCOPICALLY (N/A Chest) TRANSESOPHAGEAL ECHOCARDIOGRAM (TEE) (N/A )     Patient location during evaluation: SICU Anesthesia Type: General Level of consciousness: awake, awake and alert and oriented Pain management: pain level controlled Vital Signs Assessment: post-procedure vital signs reviewed and stable Respiratory status: spontaneous breathing, nonlabored ventilation and respiratory function stable Cardiovascular status: blood pressure returned to baseline Postop Assessment: no headache Anesthetic complications: no    Last Vitals:  Vitals:   01/06/17 2015 01/06/17 2030  BP:    Pulse: 84 88  Resp: (!) 23 14  Temp: 36.9 C 36.9 C  SpO2: 97% 98%    Last Pain:  Vitals:   01/06/17 2026  TempSrc:   PainSc: Asleep                 Danean Marner COKER

## 2017-01-06 NOTE — Anesthesia Preprocedure Evaluation (Signed)
Anesthesia Evaluation  Patient identified by MRN, date of birth, ID band Patient awake    Reviewed: Allergy & Precautions, NPO status , Patient's Chart, lab work & pertinent test results  Airway Mallampati: II  TM Distance: >3 FB Neck ROM: Full    Dental  (+) Teeth Intact, Dental Advisory Given   Pulmonary    breath sounds clear to auscultation       Cardiovascular hypertension,  Rhythm:Regular Rate:Normal     Neuro/Psych    GI/Hepatic   Endo/Other    Renal/GU      Musculoskeletal   Abdominal   Peds  Hematology   Anesthesia Other Findings   Reproductive/Obstetrics                             Anesthesia Physical Anesthesia Plan  ASA: IV  Anesthesia Plan: General   Post-op Pain Management:    Induction: Intravenous  PONV Risk Score and Plan: Ondansetron and Dexamethasone  Airway Management Planned: Oral ETT  Additional Equipment: Arterial line, CVP, PA Cath and 3D TEE  Intra-op Plan:   Post-operative Plan: Post-operative intubation/ventilation  Informed Consent: I have reviewed the patients History and Physical, chart, labs and discussed the procedure including the risks, benefits and alternatives for the proposed anesthesia with the patient or authorized representative who has indicated his/her understanding and acceptance.   Dental advisory given  Plan Discussed with: CRNA and Anesthesiologist  Anesthesia Plan Comments:         Anesthesia Quick Evaluation

## 2017-01-06 NOTE — Progress Notes (Signed)
  Echocardiogram Echocardiogram Transesophageal has been performed.  Logan Wells Logan Wells 01/06/2017, 9:16 AM

## 2017-01-06 NOTE — Op Note (Signed)
NAME:  Logan Wells, [name]                ACCOUNT NO.:  [id number]  MEDICAL RECORD NO.:  [id number]  LOCATION:  [id number]                         [facility]  PHYSICIAN:  Kerin PernaPeter Van Trigt, M.D.  DATE OF BIRTH:  08-18-1955  DATE OF PROCEDURE:  01/06/2017 DATE OF DISCHARGE:                              OPERATIVE REPORT   OPERATIONS: 1. Coronary artery bypass grafting x4 (left internal mammary artery to     left anterior descending, saphenous vein graft to diagonal,     saphenous vein graft to obtuse marginal, saphenous vein graft to     posterior descending branch of distal circumflex). 2. Endoscopic harvest right leg greater saphenous vein.  SURGEON:  Kerin PernaPeter Van Trigt, MD.  ASSISTANT:  Gershon CraneWayne Gold, PA-C.  ANESTHESIA:  General by Dr. Kipp Broodavid Joslin.  PREOPERATIVE DIAGNOSIS:  Class 4 unstable angina with severe 3-vessel coronary artery disease.  POSTOPERATIVE DIAGNOSIS:  Class 4 unstable angina with severe 3-vessel coronary artery disease.  DESCRIPTION OF PROCEDURE:  After the patient had been seen in consultation, the coronary arteriograms personally reviewed and the echocardiogram images personally reviewed and the patient was informed of the procedure in detail including the expected benefits of improved survival and preservation of LV function and including the risks of stroke, bleeding, infection, blood transfusion requirement, postoperative pulmonary problems including pleural effusion, postoperative arrhythmia, postoperative infection, postoperative organ failure, and death.  The patient was brought from the preoperative area to the operating room and placed supine on the operating table where general anesthesia was induced.  A transesophageal echo probe was placed by the Anesthesia team.  The patient was positioned and prepped and draped as a sterile field.  A proper time-out was performed.  A sternal incision was made as the saphenous vein was harvested endoscopically from the  right leg.  It was a good conduit.  The left internal mammary artery was harvested as a pedicle graft from its origin at the subclavian vessels.  It was a 1.5-mm vessel with good flow.  The sternal retractor was then placed using the deep blades.  The pericardium was opened and suspended.  Pursestrings were placed in the ascending aorta and right atrium and heparin was administered.  When ACT was documented as being therapeutic, the patient was cannulated and placed on cardiopulmonary bypass.  The coronaries were identified for grafting. The LAD was diffusely diseased and it was grafted distally.  The diagonal was diffusely diseased and it was grafted distally.  The posterior descending was significantly diseased and it was grafted distally.  The OM branch was moderately diseased and it was grafted before it became intramyocardial.  Cardioplegia cannulas were placed both antegrade and retrograde cold blood cardioplegia.  The patient was cooled to 32 degrees.  Aortic crossclamp was applied and 1 L of cold blood cardioplegia was delivered in split doses between the antegrade aortic and retrograde coronary sinus catheters.  There was good cardioplegic arrest, and septal temperature dropped less than 12 degrees.  Cardioplegia was delivered every 20 minutes.  The distal coronary anastomoses were performed.  The first distal anastomosis was to the posterior descending branch of the distal circumflex.  This was a 1.5-mm vessel.  It was heavily calcified at its origin and somewhat distally.  An incision was made distal to the plaque and a reversed saphenous vein was sewn end-to-side with running 7-0 Prolene with good flow through the graft.  Cardioplegia was redosed.  The second distal anastomosis was to the OM branch of the left circumflex.  This was a 1.7-mm vessel with approximately 80% stenosis. A reversed saphenous vein was sewn end-to-side with running 7-0 Prolene with good flow through  the graft.  Cardioplegia was redosed.  The third distal anastomosis was to the diagonal branch to LAD.  This had a proximal ostial 90% stenosis.  A reversed saphenous vein was sewn end-to-side with running 7-0 Prolene.  There was good flow through the graft.  Cardioplegia was redosed.  The fourth distal anastomosis was to the distal third of the LAD.  It was a 1.4-mm vessel.  The left IMA pedicle was brought through an opening in the left lateral pericardium and it was brought down onto the LAD and sewn end-to-side with running 8-0 Prolene.  There was good flow through the anastomosis after briefly releasing the pedicle bulldog on the mammary artery.  The bulldog was reapplied and the pedicle was secured to the epicardium with 6-0 Prolene.  Cardioplegia was redosed.  With the crossclamp still in place, 3 proximal vein anastomoses were performed on the ascending aorta using a 4.5-mm punch running 6-0 Prolene.  Prior to tying down the final proximal anastomosis, air was vented from the coronaries with a dose of retrograde warm blood cardioplegia.  The crossclamp was removed.  The heart resumed a spontaneous rhythm.  The vein grafts were de-aired and opened.  Each had good flow and hemostasis was documented at the proximal and distal sites.  The patient was rewarmed and reperfused. The cardioplegia cannulas were removed.  Temporary pacing wires were applied.  The lungs were expanded and the ventilator was resumed.  The patient was weaned off cardiopulmonary bypass on low-dose milrinone with normal hemodynamics.  Echo showed preserved LV systolic function.  There was no adverse reaction to the protamine.  There was adequate hemostasis after protamine. The mediastinum was irrigated.  The superior pericardial fat was closed over the aorta.  Anterior mediastinal and left pleural chest tubes were placed and brought out through separate incisions.  The sternum was closed with wire.  The  pectoralis fascia was closed with a running #1 Vicryl.  The subcutaneous and skin layers were closed in running Vicryl and sterile dressings were applied.  Total cardiopulmonary bypass time was 112 minutes.     Kerin PernaPeter Van Trigt, M.D.     PV/MEDQ  D:  01/06/2017  T:  01/06/2017  Job:  [id number]  cc:   Trooper Olander M. [place]azil[place]Jordan, M.D.

## 2017-01-07 ENCOUNTER — Encounter (HOSPITAL_COMMUNITY): Payer: Self-pay | Admitting: Cardiothoracic Surgery

## 2017-01-07 ENCOUNTER — Encounter (HOSPITAL_COMMUNITY): Payer: 59

## 2017-01-07 ENCOUNTER — Inpatient Hospital Stay (HOSPITAL_COMMUNITY): Payer: 59

## 2017-01-07 LAB — POCT I-STAT, CHEM 8
BUN: 8 mg/dL (ref 6–20)
CALCIUM ION: 1.16 mmol/L (ref 1.15–1.40)
CHLORIDE: 100 mmol/L — AB (ref 101–111)
Creatinine, Ser: 0.8 mg/dL (ref 0.61–1.24)
GLUCOSE: 112 mg/dL — AB (ref 65–99)
HCT: 33 % — ABNORMAL LOW (ref 39.0–52.0)
Hemoglobin: 11.2 g/dL — ABNORMAL LOW (ref 13.0–17.0)
Potassium: 4.1 mmol/L (ref 3.5–5.1)
SODIUM: 138 mmol/L (ref 135–145)
TCO2: 29 mmol/L (ref 22–32)

## 2017-01-07 LAB — CBC
HCT: 33.8 % — ABNORMAL LOW (ref 39.0–52.0)
HEMATOCRIT: 31.4 % — AB (ref 39.0–52.0)
HEMOGLOBIN: 10.6 g/dL — AB (ref 13.0–17.0)
Hemoglobin: 11.3 g/dL — ABNORMAL LOW (ref 13.0–17.0)
MCH: 31.5 pg (ref 26.0–34.0)
MCH: 31.8 pg (ref 26.0–34.0)
MCHC: 33.8 g/dL (ref 30.0–36.0)
MCHC: 33.4 g/dL (ref 30.0–36.0)
MCV: 93.2 fL (ref 78.0–100.0)
MCV: 95.2 fL (ref 78.0–100.0)
PLATELETS: 123 10*3/uL — AB (ref 150–400)
Platelets: 132 10*3/uL — ABNORMAL LOW (ref 150–400)
RBC: 3.37 MIL/uL — AB (ref 4.22–5.81)
RBC: 3.55 MIL/uL — AB (ref 4.22–5.81)
RDW: 12.1 % (ref 11.5–15.5)
RDW: 12.6 % (ref 11.5–15.5)
WBC: 10.3 10*3/uL (ref 4.0–10.5)
WBC: 9.6 10*3/uL (ref 4.0–10.5)

## 2017-01-07 LAB — BASIC METABOLIC PANEL
ANION GAP: 5 (ref 5–15)
BUN: 8 mg/dL (ref 6–20)
CHLORIDE: 107 mmol/L (ref 101–111)
CO2: 24 mmol/L (ref 22–32)
CREATININE: 0.91 mg/dL (ref 0.61–1.24)
Calcium: 7.8 mg/dL — ABNORMAL LOW (ref 8.9–10.3)
GFR calc non Af Amer: >60 mL/min (ref >60)
Glucose, Bld: 132 mg/dL — ABNORMAL HIGH (ref 65–99)
POTASSIUM: 4.3 mmol/L (ref 3.5–5.1)
SODIUM: 136 mmol/L (ref 135–145)

## 2017-01-07 LAB — GLUCOSE, CAPILLARY
GLUCOSE-CAPILLARY: 112 mg/dL — AB (ref 65–99)
GLUCOSE-CAPILLARY: 155 mg/dL — AB (ref 65–99)
Glucose-Capillary: 103 mg/dL — ABNORMAL HIGH (ref 65–99)
Glucose-Capillary: 126 mg/dL — ABNORMAL HIGH (ref 65–99)
Glucose-Capillary: 127 mg/dL — ABNORMAL HIGH (ref 65–99)
Glucose-Capillary: 87 mg/dL (ref 65–99)
Glucose-Capillary: 94 mg/dL (ref 65–99)

## 2017-01-07 LAB — CREATININE, SERUM: Creatinine, Ser: 0.89 mg/dL (ref 0.61–1.24)

## 2017-01-07 LAB — MAGNESIUM
MAGNESIUM: 2.2 mg/dL (ref 1.7–2.4)
MAGNESIUM: 2.3 mg/dL (ref 1.7–2.4)

## 2017-01-07 MED ORDER — METOCLOPRAMIDE HCL 5 MG/ML IJ SOLN
10.0000 mg | Freq: Four times a day (QID) | INTRAMUSCULAR | Status: DC
  Administered 2017-01-07 – 2017-01-09 (×8): 10 mg via INTRAVENOUS
  Filled 2017-01-07 (×9): qty 2

## 2017-01-07 MED ORDER — CHLORHEXIDINE GLUCONATE 0.12 % MT SOLN
OROMUCOSAL | Status: AC
  Filled 2017-01-07: qty 15

## 2017-01-07 MED ORDER — FENTANYL CITRATE (PF) 100 MCG/2ML IJ SOLN: 50.0000 ug | INTRAMUSCULAR | Status: DC | PRN

## 2017-01-07 MED ORDER — INSULIN ASPART 100 UNIT/ML ~~LOC~~ SOLN: 0.0000 [IU] | SUBCUTANEOUS | Status: DC

## 2017-01-07 MED ORDER — FUROSEMIDE 10 MG/ML IJ SOLN
20.0000 mg | Freq: Every day | INTRAMUSCULAR | Status: AC
  Administered 2017-01-07 – 2017-01-08 (×2): 20 mg via INTRAVENOUS
  Filled 2017-01-07 (×2): qty 2

## 2017-01-07 MED ORDER — PROMETHAZINE HCL 25 MG/ML IJ SOLN
12.5000 mg | Freq: Four times a day (QID) | INTRAMUSCULAR | Status: DC | PRN
  Administered 2017-01-07: 12.5 mg via INTRAVENOUS
  Filled 2017-01-07: qty 1

## 2017-01-07 MED ORDER — PROMETHAZINE HCL 25 MG/ML IJ SOLN: 6.2500 mg | Freq: Four times a day (QID) | INTRAMUSCULAR | Status: DC | PRN

## 2017-01-07 MED FILL — Thrombin (Recombinant) For Soln 5000 Unit: CUTANEOUS | Qty: 5000 | Status: AC

## 2017-01-07 NOTE — Care Management Note (Signed)
Case Management Note  Patient Details  Name: Logan Wells MRN: [id number] Date of Birth: Sep 17, 1955  Subjective/Objective:   From home with wife, presents with [place], has 3 vessel dz, post op CABG.  Conts on albumin, zinacef, lasix, toradol q6, levophed.                Action/Plan: NCM will follow for dc needs.   Expected Discharge Date:                  Expected Discharge Plan:  Home/Self Care  In-House Referral:     Discharge planning Services  CM Consult  Post Acute Care Choice:    Choice offered to:     DME Arranged:    DME Agency:     HH Arranged:    HH Agency:     Status of Service:  In process, will continue to follow  If discussed at Long Length of Stay Meetings, dates discussed:    Additional Comments:  Leone Havenaylor, Beverley Sherrard Clinton, RN 01/07/2017, 10:27 AM

## 2017-01-07 NOTE — Progress Notes (Signed)
1 Day Post-Op Procedure(s) (LRB): CORONARY ARTERY BYPASS GRAFTING (CABG) x4 , USING LEFT INTERNAL MAMMARY ARTERY AND RIGHT GREATER SAPHENOUS VEIN HARVESTED ENDOSCOPICALLY (N/A) TRANSESOPHAGEAL ECHOCARDIOGRAM (TEE) (N/A) Subjective: Stable after CABG 5 for unstable angina but with significant nausea Reglan and Phenergan added to Zofran Sinus rhythm Chest x-ray clear Right pleural tube for postop right pneumothorax remains in place  Objective: Vital signs in last 24 hours: Temp:  [96.1 F (35.6 C)-98.4 F (36.9 C)] 97.5 F (36.4 C) (12/12 1030) Pulse Rate:  [79-94] 80 (12/12 1230) Cardiac Rhythm: Atrial paced (12/12 1200) Resp:  [8-38] 13 (12/12 1230) BP: (80-130)/(62-91) 105/78 (12/12 1230) SpO2:  [92 %-100 %] 98 % (12/12 1230) FiO2 (%):  [40 %-50 %] 40 % (12/11 1712) Weight:  [171 lb 15.3 oz (78 kg)-188 lb 0.8 oz (85.3 kg)] 188 lb 0.8 oz (85.3 kg) (12/12 0500)  Hemodynamic parameters for last 24 hours: PAP: (19-39)/(7-23) 37/13 CO:  [3.9 L/min-7.3 L/min] 7.3 L/min CI:  [2 L/min/m2-3.6 L/min/m2] 3.6 L/min/m2  Intake/Output from previous day: 12/11 0701 - 12/12 0700 In: 5265.8 [I.V.:3690.8; Blood:275; IV Piggyback:1300] Out: 5845 [Urine:4375; Blood:1120; Chest Tube:350] Intake/Output this shift: Total I/O In: 335.1 [I.V.:285.1; IV Piggyback:50] Out: 1200 [Urine:1000; Chest Tube:200]       Exam    General- alert and comfortable   Lungs- clear without rales, wheezes   Cor- regular rate and rhythm, no murmur , gallop   Abdomen- soft, non-tender   Extremities - warm, non-tender, minimal edema   Neuro- oriented, appropriate, no focal weakness   Lab Results: Recent Labs    01/06/17 1834 01/06/17 1845 01/07/17 0302  WBC 11.2*  --  10.3  HGB 12.2* 11.9* 10.6*  HCT 36.5* 35.0* 31.4*  PLT 145*  --  132*   BMET:  Recent Labs    01/06/17 0135  01/06/17 1845 01/07/17 0302  NA 137   < > 142 136  K 3.4*   < > 4.4 4.3  CL 103   < > 107 107  CO2 27  --   --  24   GLUCOSE 94   < > 125* 132*  BUN 11   < > 8 8  CREATININE 1.02   < > 0.80 0.91  CALCIUM 8.9  --   --  7.8*   < > = values in this interval not displayed.    PT/INR:  Recent Labs    01/06/17 1348  LABPROT 16.3*  INR 1.32   ABG    Component Value Date/Time   PHART 7.317 (L) 01/06/2017 1946   HCO3 21.1 01/06/2017 1946   TCO2 22 01/06/2017 1946   ACIDBASEDEF 5.0 (H) 01/06/2017 1946   O2SAT 96.0 01/06/2017 1946   CBG (last 3)  Recent Labs    01/06/17 2350 01/07/17 0321 01/07/17 0822  GLUCAP 155* 127* 126*    Assessment/Plan: S/P Procedure(s) (LRB): CORONARY ARTERY BYPASS GRAFTING (CABG) x4 , USING LEFT INTERNAL MAMMARY ARTERY AND RIGHT GREATER SAPHENOUS VEIN HARVESTED ENDOSCOPICALLY (N/A) TRANSESOPHAGEAL ECHOCARDIOGRAM (TEE) (N/A) Mobilize, diuresis, removed tubes Treat nausea Change morphine to fentanyl   LOS: 3 days    Logan Wells 01/07/2017

## 2017-01-07 NOTE — Progress Notes (Signed)
Anesthesiology Follow-up:  Awake and alert, in good spirits, neuro intact, hemodynamically stable on milrinone 0.25 mcg/kg/min and norepi 3 mcg/min  VS: T-36.3 BP- 107/75 HR 80 (a-paced) RR- 15 O2 Sat 100% on 2 L O2 PA 35/16 CO/CI 7.3/3.6   K- 4.3 glucose- 111 Na- 136 BUN/Cr 8/0.91 H/H- 10.6/31.4 Platelets- 132,000  Extubated 4 1/2 hours after surgery.   61 year old male admitted with unstable angina one day S/P CABG X 4. Stable post-op course.   Logan Wells

## 2017-01-07 NOTE — Progress Notes (Signed)
Patient ID: Logan Wells, male   DOB: 24-Jun-1955, 61 y.o.   MRN: [id number] TCTS Evening Rounds:  Hemodynamically stable in sinus rhythm.  sats 97% on RA  Good urine output  BMET    Component Value Date/Time   NA 138 01/07/2017 1617   NA 143 12/23/2016 1412   K 4.1 01/07/2017 1617   CL 100 (L) 01/07/2017 1617   CO2 24 01/07/2017 0302   GLUCOSE 112 (H) 01/07/2017 1617   BUN 8 01/07/2017 1617   BUN 13 12/23/2016 1412   CREATININE 0.80 01/07/2017 1617   CALCIUM 7.8 (L) 01/07/2017 0302   GFRNONAA >60 01/07/2017 1605   GFRAA >60 01/07/2017 1605   CBC    Component Value Date/Time   WBC 9.6 01/07/2017 1605   RBC 3.55 (L) 01/07/2017 1605   HGB 11.2 (L) 01/07/2017 1617   HCT 33.0 (L) 01/07/2017 1617   PLT 123 (L) 01/07/2017 1605   MCV 95.2 01/07/2017 1605   MCH 31.8 01/07/2017 1605   MCHC 33.4 01/07/2017 1605   RDW 12.6 01/07/2017 1605

## 2017-01-08 ENCOUNTER — Inpatient Hospital Stay (HOSPITAL_COMMUNITY): Payer: 59

## 2017-01-08 LAB — TYPE AND SCREEN
ABO/RH(D): A POS
Antibody Screen: NEGATIVE
Unit division: 0
Unit division: 0

## 2017-01-08 LAB — BASIC METABOLIC PANEL
Anion gap: 6 (ref 5–15)
BUN: 9 mg/dL (ref 6–20)
CO2: 28 mmol/L (ref 22–32)
Calcium: 8.5 mg/dL — ABNORMAL LOW (ref 8.9–10.3)
Chloride: 103 mmol/L (ref 101–111)
Creatinine, Ser: 0.88 mg/dL (ref 0.61–1.24)
GFR calc Af Amer: >60 mL/min (ref >60)
GFR calc non Af Amer: >60 mL/min (ref >60)
Glucose, Bld: 99 mg/dL (ref 65–99)
Potassium: 3.8 mmol/L (ref 3.5–5.1)
Sodium: 137 mmol/L (ref 135–145)

## 2017-01-08 LAB — GLUCOSE, CAPILLARY
GLUCOSE-CAPILLARY: 96 mg/dL (ref 65–99)
GLUCOSE-CAPILLARY: 108 mg/dL — AB (ref 65–99)
GLUCOSE-CAPILLARY: 97 mg/dL (ref 65–99)
GLUCOSE-CAPILLARY: 88 mg/dL (ref 65–99)
Glucose-Capillary: 120 mg/dL — ABNORMAL HIGH (ref 65–99)

## 2017-01-08 LAB — BPAM RBC
Blood Product Expiration Date: 201901012359
Blood Product Expiration Date: 201901012359
Unit Type and Rh: 6200
Unit Type and Rh: 6200

## 2017-01-08 LAB — CBC
HCT: 34 % — ABNORMAL LOW (ref 39.0–52.0)
Hemoglobin: 11.1 g/dL — ABNORMAL LOW (ref 13.0–17.0)
MCH: 31.2 pg (ref 26.0–34.0)
MCHC: 32.6 g/dL (ref 30.0–36.0)
MCV: 95.5 fL (ref 78.0–100.0)
Platelets: 112 10*3/uL — ABNORMAL LOW (ref 150–400)
RBC: 3.56 MIL/uL — ABNORMAL LOW (ref 4.22–5.81)
RDW: 12.5 % (ref 11.5–15.5)
WBC: 9.9 10*3/uL (ref 4.0–10.5)

## 2017-01-08 MED ORDER — METOPROLOL TARTRATE 25 MG PO TABS
25.0000 mg | ORAL_TABLET | Freq: Two times a day (BID) | ORAL | Status: DC
  Administered 2017-01-08 – 2017-01-10 (×4): 25 mg via ORAL
  Filled 2017-01-08 (×4): qty 1

## 2017-01-08 MED ORDER — ALPRAZOLAM 0.5 MG PO TABS
0.5000 mg | ORAL_TABLET | Freq: Three times a day (TID) | ORAL | Status: DC | PRN
  Administered 2017-01-08: 0.5 mg via ORAL
  Filled 2017-01-08: qty 1

## 2017-01-08 MED ORDER — MAGNESIUM HYDROXIDE 400 MG/5ML PO SUSP
30.0000 mL | Freq: Every day | ORAL | Status: DC | PRN
Start: 2017-01-08 — End: 2017-01-10
  Filled 2017-01-08: qty 30

## 2017-01-08 MED ORDER — SODIUM CHLORIDE 0.9% FLUSH
3.0000 mL | Freq: Two times a day (BID) | INTRAVENOUS | Status: DC
  Administered 2017-01-08 – 2017-01-10 (×3): 3 mL via INTRAVENOUS

## 2017-01-08 MED ORDER — FUROSEMIDE 40 MG PO TABS
40.0000 mg | ORAL_TABLET | Freq: Every day | ORAL | Status: DC
  Administered 2017-01-09 – 2017-01-10 (×2): 40 mg via ORAL
  Filled 2017-01-08 (×2): qty 1

## 2017-01-08 MED ORDER — MOVING RIGHT ALONG BOOK
Freq: Once | Status: AC
  Administered 2017-01-08: 10:00:00
  Filled 2017-01-08: qty 1

## 2017-01-08 MED ORDER — POTASSIUM CHLORIDE CRYS ER 20 MEQ PO TBCR
20.0000 meq | EXTENDED_RELEASE_TABLET | Freq: Every day | ORAL | Status: DC
  Administered 2017-01-08 – 2017-01-10 (×3): 20 meq via ORAL
  Filled 2017-01-08 (×3): qty 1

## 2017-01-08 MED ORDER — SODIUM CHLORIDE 0.9 % IV SOLN: 250.0000 mL | INTRAVENOUS | Status: DC | PRN

## 2017-01-08 MED ORDER — SODIUM CHLORIDE 0.9% FLUSH: 3.0000 mL | INTRAVENOUS | Status: DC | PRN

## 2017-01-08 MED ORDER — METOPROLOL TARTRATE 25 MG/10 ML ORAL SUSPENSION: 12.5000 mg | Freq: Two times a day (BID) | ORAL | Status: DC

## 2017-01-08 NOTE — Discharge Instructions (Signed)
Coronary Artery Bypass Grafting, Care After °These instructions give you information on caring for yourself after your procedure. Your doctor may also give you more specific instructions. Call your doctor if you have any problems or questions after your procedure. °Follow these instructions at home: °· Only take medicine as told by your doctor. Take medicines exactly as told. Do not stop taking medicines or start any new medicines without talking to your doctor first. °· Take your pulse as told by your doctor. °· Do deep breathing as told by your doctor. Use your breathing device (incentive spirometer), if given, to practice deep breathing several times a day. Support your chest with a pillow or your arms when you take deep breaths or cough. °· Keep the area clean, dry, and protected where the surgery cuts (incisions) were made. Remove bandages (dressings) only as told by your doctor. If strips were applied to surgical area, do not take them off. They fall off on their own. °· Check the surgery area daily for puffiness (swelling), redness, or leaking fluid. °· If surgery cuts were made in your legs: °? Avoid crossing your legs. °? Avoid sitting for long periods of time. Change positions every 30 minutes. °? Raise your legs when you are sitting. Place them on pillows. °· Wear stockings that help keep blood clots from forming in your legs (compression stockings). °· Only take sponge baths until your doctor says it is okay to take showers. Pat the surgery area dry. Do not rub the surgery area with a washcloth or towel. Do not bathe, swim, or use a hot tub until your doctor says it is okay. °· Eat foods that are high in fiber. These include raw fruits and vegetables, whole grains, beans, and nuts. Choose lean meats. Avoid canned, processed, and fried foods. °· Drink enough fluids to keep your pee (urine) clear or pale yellow. °· Weigh yourself every day. °· Rest and limit activity as told by your doctor. You may be told  to: °? Stop any activity if you have chest pain, shortness of breath, changes in heartbeat, or dizziness. Get help right away if this happens. °? Move around often for short amounts of time or take short walks as told by your doctor. Gradually become more active. You may need help to strengthen your muscles and build endurance. °? Avoid lifting, pushing, or pulling anything heavier than 10 pounds (4.5 kg) for at least 6 weeks after surgery. °· Do not drive until your doctor says it is okay. °· Ask your doctor when you can go back to work. °· Ask your doctor when you can begin sexual activity again. °· Follow up with your doctor as told. °Contact a doctor if: °· You have puffiness, redness, more pain, or fluid draining from the incision site. °· You have a fever. °· You have puffiness in your ankles or legs. °· You have pain in your legs. °· You gain 2 or more pounds (0.9 kg) a day. °· You feel sick to your stomach (nauseous) or throw up (vomit). °· You have watery poop (diarrhea). °Get help right away if: °· You have chest pain that goes to your jaw or arms. °· You have shortness of breath. °· You have a fast or irregular heartbeat. °· You notice a "clicking" in your breastbone when you move. °· You have numbness or weakness in your arms or legs. °· You feel dizzy or light-headed. °This information is not intended to replace advice given to you by   your health care provider. Make sure you discuss any questions you have with your health care provider. °Document Released: 01/18/2013 Document Revised: 06/21/2015 Document Reviewed: 06/22/2012 °Elsevier Interactive Patient Education © 2017 Else[employer] Inc. ° °

## 2017-01-08 NOTE — Discharge Summary (Signed)
Physician Discharge Summary  Patient ID: Logan Wells MRN: [id number] DOB/AGE: 1955/04/17 61 y.o.  Admit date: 01/04/2017 Discharge date: 01/10/2017  Admission Diagnoses: 1.  Unstable angina (HCC) 2. Non-ST elevation (NSTEMI) myocardial infarction (HCC) 3. CAD (coronary artery disease)  Active Diagnoses:  1. Essential hypertension 2. Dyslipidemia 3. Family history of coronary artery disease in mother 21. GERD (gastroesophageal reflux disease)  Discharged Condition: good   HPI:  61 year old nondiabetic nonsmoker with positive family history of CAD-CABG was admitted to the ED with symptoms of unstable angina. Cardiac enzymes were negative. Cardiac catheterization performed earlier today by Dr. [place] demonstrates severe three-vessel CAD with normal LV systolic function, normal LVEDP. The patient has a very tight 95% stenosis of the LAD-diagonal, moderate stenosis of a large OM and high-grade stenosis of a posterior descending off the dominant distal circumflex. CT of the chest was performed prior to admission which demonstrated evidence of significant CAD but otherwise  was unremarkable.   patient currently stable on IV nitroglycerin. IV heparin to start after his radial artery band is removed from the right wrist.    Hospital Course:  On 12/11/ 2018 Mr. Abbruzzese underwent a coronary bypass grafting 4 with Dr. Donata Clay. The patient tolerated the procedure well and was transferred to the surgical ICU. He was extubated in a timely manner. He had postop nausea which was treated with Zofran. The patient's nausea later resolved. He was started on Lopressor and this was titrated accordingly. He was volume overloaded and diuresed.  He had ABL anemia. He did not require a transfusion. His last H and H was stable at 12.2 and 37. He also had mild thrombocytopenia. His last platelet count was up to 126,000. He was weaned off the Insulin drip. His pre op HGA1C was 5.5.  He was stable to transfer to 4 East  telemetry for continued care. He has been tolerating a diet and had a bowel movement. He has been ambulating on room air. His incisions are clean and dry;there are no signs of infection. Epicardial pacing wires were removed on 01/09/2017. Chest tube sutures will removed today 01/10/2017.  As discussed with Dr. Charleen Kirks, patient is felt surgically stable for discharge today.  Consults: None  Significant Diagnostic Studies: LEFT HEART CATH AND CORONARY ANGIOGRAPHY by Dr. [place]and on 01/05/2017:  Conclusion     Prox RCA to Mid RCA lesion is 90% stenosed.  Mid RCA to Dist RCA lesion is 90% stenosed.  Ost LAD lesion is 90% stenosed.  Prox LAD lesion is 95% stenosed.  Prox LAD to Mid LAD lesion is 50% stenosed.  Ost 1st Mrg lesion is 60% stenosed.  Dist Cx lesion is 90% stenosed.  Ost LPDA to LPDA lesion is 80% stenosed.  The left ventricular systolic function is normal.  LV end diastolic pressure is normal.  The left ventricular ejection fraction is 55-65% by visual estimate.   1. 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   - 90% ostial LAD   - 95% proximal LAD at diagonal bifurcation   - 90% distal LCx with long segmental disease extending into PDA   - 90% nondominant RCA 2. Good LV function 3. Normal LVEDP  Plan: given complexity of disease I would recommend surgical consultation for CABG.    CLINICAL DATA:  Shortness of breath.  Chest tube.  EXAM: PORTABLE CHEST 1 VIEW  COMPARISON:  01/07/2017 .  01/06/2017.  FINDINGS: Interim removal of endotracheal tube, Swan-Ganz catheter, mediastinal drainage catheter, left chest tube. Right IJ  sheath and right chest tube in stable position. Small right pneumothorax again noted. Mild interim improvement. Prior CABG. Cardiomegaly. Low lung volumes. Left base infiltrate and small left pleural effusion. No pneumothorax. Left shoulder replacement.  IMPRESSION: 1. Interim removal of endotracheal tube, Swan-Ganz  catheter, mediastinal drainage catheter, left chest tube . 2. Right IJ sheath and right chest tube in stable position. Tiny right pneumothorax again noted. 3.  Prior CABG.  Cardiomegaly. 4. Low lung volumes. Left base infiltrate and small left pleural effusion .  Electronically Signed   By: Maisie Fushomas  Register   On: 01/08/2017 08:31   Treatments:   OPERATIVE REPORT   OPERATIONS: 1. Coronary artery bypass grafting x4 (left internal mammary artery to     left anterior descending, saphenous vein graft to diagonal,     saphenous vein graft to obtuse marginal, saphenous vein graft to     posterior descending branch of distal circumflex). 2. Endoscopic harvest right leg greater saphenous vein.  SURGEON:  Kerin PernaPeter Van Trigt, MD.  ASSISTANT:  Gershon CraneWayne Gold, PA-C.  ANESTHESIA:  General by Dr. Kipp Broodavid Joslin.  3. Placement of right chest tube by Dr. Donata ClayVan Trigt on 01/06/2017.    Discharge Exam: Blood pressure 106/80, pulse 88, temperature 98.6 F (37 C), temperature source Oral, resp. rate 13, height 5\' 10"  (1.778 m), weight 173 lb 8 oz (78.7 kg), SpO2 92 %.   General appearance: alert, cooperative and no distress Heart: regular rate and rhythm Lungs: min din in bases Abdomen: benign Extremities: no edema Wound: incias healing well  Disposition: Stable and discharged to home.  Discharge Instructions    Amb Referral to Cardiac Rehabilitation   Complete by:  As directed    Diagnosis:   CABG NSTEMI     CABG X ___:  4     Allergies as of 01/10/2017   No Known Allergies     Medication List    STOP taking these medications   ibuprofen 200 MG tablet Commonly known as:  ADVIL,MOTRIN   losartan 50 MG tablet Commonly known as:  COZAAR   nitroGLYCERIN 0.4 MG SL tablet Commonly known as:  NITROSTAT   simvastatin 40 MG tablet Commonly known as:  ZOCOR     TAKE these medications   alum & mag hydroxide-simeth 200-200-20 MG/5ML suspension Commonly known as:   MAALOX/MYLANTA Take 15 mLs by mouth every 6 (six) hours as needed for indigestion or heartburn.   aspirin 325 MG EC tablet Take 1 tablet (325 mg total) by mouth daily. Start taking on:  01/11/2017   atorvastatin 80 MG tablet Commonly known as:  LIPITOR Take 1 tablet (80 mg total) by mouth daily at 6 PM.   calcium carbonate 500 MG chewable tablet Commonly known as:  TUMS - dosed in mg elemental calcium Chew 1 tablet by mouth daily as needed for indigestion or heartburn.   esomeprazole 10 MG packet Commonly known as:  NEXIUM Take 10 mg by mouth daily before breakfast.   furosemide 40 MG tablet Commonly known as:  LASIX Take 1 tablet (40 mg total) by mouth daily. For 5 days then stop. Start taking on:  01/11/2017   metoprolol tartrate 25 MG tablet Commonly known as:  LOPRESSOR Take 1 tablet (25 mg total) by mouth once for 1 dose. What changed:    medication strength  how much to take   mupirocin ointment 2 % Commonly known as:  BACTROBAN Place 1 application into the nose 2 (two) times daily. For one day then  stop.   oxyCODONE 5 MG immediate release tablet Commonly known as:  Oxy IR/ROXICODONE Take 5 mg by mouth every 4-6 hours PRN severe pain.   potassium chloride SA 20 MEQ tablet Commonly known as:  K-DUR,KLOR-CON Take 1 tablet (20 mEq total) by mouth daily. For 5 days then stop. Start taking on:  01/11/2017     The patient has been discharged on:   1.Beta Blocker:  Yes [  x ]                              No   [   ]                              If No, reason:  2.Ace Inhibitor/ARB: Yes [   ]                                     No  [  x ]                                     If No, reason:Labile BP;hope to start after discharge  3.Statin:   Yes [  x ]                  No  [   ]                  If No, reason:  4.Ecasa:  Yes  [ x  ]                  No   [   ]                  If No, reason:   Follow-up Information    Kerin PernaVan Trigt, Peter, MD. Go on  02/18/2017.   Specialty:  Cardiothoracic Surgery Why:  PA[facility]LAT CXR [facility] which is in the same building as Dr. Zenaida NieceVan Trigt's office) on 02/18/2017 at 11:00 am ;Appointment time is at 11:30 am Contact information: [address] [place] [address] [phone]        Allayne ButcherSimmons, Brittainy M, PA-C. Go on 01/28/2017.   Specialties:  Cardiology, Radiology Why:  Appointment time is at 11:00 am Contact information: [address] [place] [address] [phone]            Signed: Elenore RotaDonielle M Zimmerman PA-C 01/10/2017, 12:26 PM

## 2017-01-08 NOTE — Progress Notes (Signed)
1350 Came to see pt to walk. He is fast asleep. Talked with pt's RN. He has walked already at least once today. She stated she will walk with him later today. Will follow up tomorrow. Luetta Nuttingharlene Kannon Baum RN BSN 01/08/2017 1:59 PM

## 2017-01-08 NOTE — Progress Notes (Signed)
2 Days Post-Op Procedure(s) (LRB): CORONARY ARTERY BYPASS GRAFTING (CABG) x4 , USING LEFT INTERNAL MAMMARY ARTERY AND RIGHT GREATER SAPHENOUS VEIN HARVESTED ENDOSCOPICALLY (N/A) TRANSESOPHAGEAL ECHOCARDIOGRAM (TEE) (N/A) Subjective: OOB in chair No more nausea  Objective: Vital signs in last 24 hours: Temp:  [97.2 F (36.2 C)-99 F (37.2 C)] 97.7 F (36.5 C) (12/13 0400) Pulse Rate:  [77-92] 92 (12/13 0700) Cardiac Rhythm: Normal sinus rhythm (12/13 0000) Resp:  [7-39] 24 (12/13 0700) BP: (87-146)/(61-85) 146/82 (12/13 0700) SpO2:  [91 %-100 %] 91 % (12/13 0700) Weight:  [182 lb 12.2 oz (82.9 kg)-197 lb 8.5 oz (89.6 kg)] 182 lb 12.2 oz (82.9 kg) (12/13 0600)  Hemodynamic parameters for last 24 hours: PAP: (31-37)/(12-17) 37/13 CO:  [7.3 L/min] 7.3 L/min CI:  [3.6 L/min/m2] 3.6 L/min/m2  Intake/Output from previous day: 12/12 0701 - 12/13 0700 In: 745.1 [I.V.:645.1; IV Piggyback:100] Out: 2680 [Urine:2400; Chest Tube:280] Intake/Output this shift: Total I/O In: 50 [IV Piggyback:50] Out: -        Exam    General- alert and comfortable   Lungs- clear without rales, wheezes   Cor- regular rate and rhythm, no murmur , gallop   Abdomen- soft, non-tender   Extremities - warm, non-tender, minimal edema   Neuro- oriented, appropriate, no focal weakness   Lab Results: Recent Labs    01/07/17 1605 01/07/17 1617 01/08/17 0455  WBC 9.6  --  9.9  HGB 11.3* 11.2* 11.1*  HCT 33.8* 33.0* 34.0*  PLT 123*  --  112*   BMET:  Recent Labs    01/07/17 0302  01/07/17 1617 01/08/17 0455  NA 136  --  138 137  K 4.3  --  4.1 3.8  CL 107  --  100* 103  CO2 24  --   --  28  GLUCOSE 132*  --  112* 99  BUN 8  --  8 9  CREATININE 0.91   < > 0.80 0.88  CALCIUM 7.8*  --   --  8.5*   < > = values in this interval not displayed.    PT/INR:  Recent Labs    01/06/17 1348  LABPROT 16.3*  INR 1.32   ABG    Component Value Date/Time   PHART 7.317 (L) 01/06/2017 1946   HCO3  21.1 01/06/2017 1946   TCO2 29 01/07/2017 1617   ACIDBASEDEF 5.0 (H) 01/06/2017 1946   O2SAT 96.0 01/06/2017 1946   CBG (last 3)  Recent Labs    01/07/17 1610 01/07/17 2010 01/07/17 2350  GLUCAP 103* 94 87    Assessment/Plan: S/P Procedure(s) (LRB): CORONARY ARTERY BYPASS GRAFTING (CABG) x4 , USING LEFT INTERNAL MAMMARY ARTERY AND RIGHT GREATER SAPHENOUS VEIN HARVESTED ENDOSCOPICALLY (N/A) TRANSESOPHAGEAL ECHOCARDIOGRAM (TEE) (N/A) Transfer to 4 East telemetry DC foley , R chest tube  LOS: 4 days    Kathlee Nationseter Van Trigt III 01/08/2017

## 2017-01-08 NOTE — Plan of Care (Signed)
Transferred to 4E21 via wheelchair and monitor, RN to receive in room, SCD's and pacing wires with pt.

## 2017-01-08 NOTE — Plan of Care (Signed)
Pt vitals WNL. No vasopressors. Maintaining adequate urinary output.pt on minimal oxygen via cannula.

## 2017-01-08 NOTE — Plan of Care (Signed)
Report to 4E RN 

## 2017-01-08 NOTE — Progress Notes (Signed)
Pt foley connected to unsealed gravity drainage bag. Connection with pink plastic tape around. No leaks. POD 2. Foley to be d/ced today.

## 2017-01-09 ENCOUNTER — Inpatient Hospital Stay (HOSPITAL_COMMUNITY): Payer: 59

## 2017-01-09 LAB — CBC
HCT: 37 % — ABNORMAL LOW (ref 39.0–52.0)
Hemoglobin: 12.2 g/dL — ABNORMAL LOW (ref 13.0–17.0)
MCH: 31.4 pg (ref 26.0–34.0)
MCHC: 33 g/dL (ref 30.0–36.0)
MCV: 95.1 fL (ref 78.0–100.0)
Platelets: 126 10*3/uL — ABNORMAL LOW (ref 150–400)
RBC: 3.89 MIL/uL — ABNORMAL LOW (ref 4.22–5.81)
RDW: 12.4 % (ref 11.5–15.5)
WBC: 10.8 10*3/uL — ABNORMAL HIGH (ref 4.0–10.5)

## 2017-01-09 LAB — BASIC METABOLIC PANEL
Anion gap: 7 (ref 5–15)
BUN: 9 mg/dL (ref 6–20)
CO2: 26 mmol/L (ref 22–32)
Calcium: 8.6 mg/dL — ABNORMAL LOW (ref 8.9–10.3)
Chloride: 104 mmol/L (ref 101–111)
Creatinine, Ser: 0.94 mg/dL (ref 0.61–1.24)
GFR calc Af Amer: >60 mL/min (ref >60)
GFR calc non Af Amer: >60 mL/min (ref >60)
Glucose, Bld: 109 mg/dL — ABNORMAL HIGH (ref 65–99)
Potassium: 3.8 mmol/L (ref 3.5–5.1)
Sodium: 137 mmol/L (ref 135–145)

## 2017-01-09 LAB — GLUCOSE, CAPILLARY
GLUCOSE-CAPILLARY: 108 mg/dL — AB (ref 65–99)
Glucose-Capillary: 101 mg/dL — ABNORMAL HIGH (ref 65–99)
Glucose-Capillary: 106 mg/dL — ABNORMAL HIGH (ref 65–99)

## 2017-01-09 MED ORDER — SORBITOL 70 % PO SOLN
60.0000 mL | Freq: Every day | ORAL | Status: DC | PRN
  Administered 2017-01-09: 60 mL via ORAL
  Filled 2017-01-09 (×2): qty 60

## 2017-01-09 MED FILL — Dexmedetomidine HCl in NaCl 0.9% IV Soln 400 MCG/100ML: INTRAVENOUS | Qty: 100 | Status: AC

## 2017-01-09 MED FILL — Magnesium Sulfate Inj 50%: INTRAMUSCULAR | Qty: 10 | Status: AC

## 2017-01-09 MED FILL — Heparin Sodium (Porcine) Inj 1000 Unit/ML: INTRAMUSCULAR | Qty: 30 | Status: AC

## 2017-01-09 MED FILL — Potassium Chloride Inj 2 mEq/ML: INTRAVENOUS | Qty: 40 | Status: AC

## 2017-01-09 NOTE — Progress Notes (Signed)
CARDIAC REHAB PHASE I   PRE:  Rate/Rhythm: 95 SR  BP:  Supine: 132/82  Sitting:   Standing:    SaO2: 93%RA  MODE:  Ambulation: 470 ft   POST:  Rate/Rhythm: 108 ST  BP:  Supine: 149/99  Sitting:   Standing:    SaO2: 91%RA 0950-1030 Pt walked 470 ft with hand held asst and steady gait. Does not need walker. Education completed with pt who voiced understanding. Reviewed sternal precautions, IS, ex ed and heart healthy food choices. Pt has been trying to eat heart healthy and has been an avid exerciser. Discussed CRP 2 and will refer to [place] program. Wrote down how to view discharge video and encouraged pt to view. Encouraged IS and more walks today.   Logan Nuttingharlene Jaedon Siler, RN BSN  01/09/2017 10:28 AM

## 2017-01-09 NOTE — Progress Notes (Signed)
[address]       [place] [id number]             [phone]      3 Days Post-Op Procedure(s) (LRB): CORONARY ARTERY BYPASS GRAFTING (CABG) x4 , USING LEFT INTERNAL MAMMARY ARTERY AND RIGHT GREATER SAPHENOUS VEIN HARVESTED ENDOSCOPICALLY (N/A) TRANSESOPHAGEAL ECHOCARDIOGRAM (TEE) (N/A) Subjective: Feels pretty well, some cough, nonproductive hemodyn stable in sinus rhythm  Objective: Vital signs in last 24 hours: Temp:  [98.9 F (37.2 C)-99.2 F (37.3 C)] 98.9 F (37.2 C) (12/14 0528) Pulse Rate:  [90-107] 101 (12/14 0918) Cardiac Rhythm: Normal sinus rhythm (12/14 0700) Resp:  [16-18] 16 (12/14 0528) BP: (119-147)/(64-82) 132/82 (12/14 0918) SpO2:  [91 %-96 %] 96 % (12/14 0528) Weight:  [176 lb 9.6 oz (80.1 kg)] 176 lb 9.6 oz (80.1 kg) (12/14 0528)  Hemodynamic parameters for last 24 hours:    Intake/Output from previous day: 12/13 0701 - 12/14 0700 In: 570 [P.O.:480; I.V.:40; IV Piggyback:50] Out: 2300 [Urine:2300] Intake/Output this shift: No intake/output data recorded.  General appearance: alert, cooperative and no distress Heart: regular rate and rhythm Lungs: min din in bases Abdomen: benign Extremities: no edema Wound: incias healing well  Lab Results: Recent Labs    01/08/17 0455 01/09/17 0245  WBC 9.9 10.8*  HGB 11.1* 12.2*  HCT 34.0* 37.0*  PLT 112* 126*   BMET:  Recent Labs    01/08/17 0455 01/09/17 0245  NA 137 137  K 3.8 3.8  CL 103 104  CO2 28 26  GLUCOSE 99 109*  BUN 9 9  CREATININE 0.88 0.94  CALCIUM 8.5* 8.6*    PT/INR:  Recent Labs    01/06/17 1348  LABPROT 16.3*  INR 1.32   ABG    Component Value Date/Time   PHART 7.317 (L) 01/06/2017 1946   HCO3 21.1 01/06/2017 1946   TCO2 29 01/07/2017 1617   ACIDBASEDEF 5.0 (H) 01/06/2017 1946   O2SAT 96.0 01/06/2017 1946   CBG (last 3)  Recent Labs    01/08/17 2118 01/09/17 0559 01/09/17 1119  GLUCAP 120* 101* 106*    Meds Scheduled Meds: .  acetaminophen  1,000 mg Oral Q6H   Or  . acetaminophen (TYLENOL) oral liquid 160 mg/5 mL  1,000 mg Per Tube Q6H  . aspirin EC  325 mg Oral Daily   Or  . aspirin  324 mg Per Tube Daily  . atorvastatin  80 mg Oral q1800  . bisacodyl  10 mg Oral Daily   Or  . bisacodyl  10 mg Rectal Daily  . Chlorhexidine Gluconate Cloth  6 each Topical Daily  . docusate sodium  200 mg Oral Daily  . furosemide  40 mg Oral Daily  . insulin aspart  0-24 Units Subcutaneous Q4H  . metoCLOPramide (REGLAN) injection  10 mg Intravenous Q6H  . metoprolol tartrate  25 mg Oral BID   Or  . metoprolol tartrate  12.5 mg Per Tube BID  . mupirocin ointment  1 application Nasal BID  . pantoprazole  40 mg Oral Daily  . potassium chloride  20 mEq Oral Daily  . sodium chloride flush  3 mL Intravenous Q12H  . sodium chloride flush  3 mL Intravenous Q12H   Continuous Infusions: . sodium chloride    . sodium chloride    . sodium chloride Stopped (01/07/17 1000)  . sodium chloride    . lactated ringers Stopped (01/07/17 1000)  . lactated ringers Stopped (01/08/17  [id number])   PRN Meds:.sodium chloride, sodium chloride, ALPRAZolam, fentaNYL (SUBLIMAZE) injection, magnesium hydroxide, metoprolol tartrate, ondansetron (ZOFRAN) IV, oxyCODONE, promethazine, sodium chloride flush, sodium chloride flush, traMADol  Xrays Dg Chest 2 View  Result Date: 01/09/2017 CLINICAL DATA:  Open heart surgery 5 days ago. EXAM: CHEST  2 VIEW COMPARISON:  01/08/2017. FINDINGS: Interval removal of right IJ sheath and right chest tube. Tiny right apical pneumothorax again noted. Prior CABG. Heart size normal. Interval partial resolution of left base infiltrate. Mild bibasilar atelectasis. No prominent pleural effusion. Left shoulder replacement. IMPRESSION: 1. Interim removal of right IJ sheath and right chest tube. Tiny right apical pneumothorax again noted. 2.  Prior CABG.  Heart size stable. 3. Interim partial resolution of left base infiltrate.  Low lung volumes with bibasilar atelectasis. Electronically Signed   By: Maisie Fushomas  Register   On: 01/09/2017 08:25   Dg Chest Port 1 View  Result Date: 01/08/2017 CLINICAL DATA:  Shortness of breath.  Chest tube. EXAM: PORTABLE CHEST 1 VIEW COMPARISON:  01/07/2017 .  01/06/2017. FINDINGS: Interim removal of endotracheal tube, Swan-Ganz catheter, mediastinal drainage catheter, left chest tube. Right IJ sheath and right chest tube in stable position. Small right pneumothorax again noted. Mild interim improvement. Prior CABG. Cardiomegaly. Low lung volumes. Left base infiltrate and small left pleural effusion. No pneumothorax. Left shoulder replacement. IMPRESSION: 1. Interim removal of endotracheal tube, Swan-Ganz catheter, mediastinal drainage catheter, left chest tube . 2. Right IJ sheath and right chest tube in stable position. Tiny right pneumothorax again noted. 3.  Prior CABG.  Cardiomegaly. 4. Low lung volumes. Left base infiltrate and small left pleural effusion . Electronically Signed   By: Maisie Fushomas  Register   On: 01/08/2017 08:31    Assessment/Plan: S/P Procedure(s) (LRB): CORONARY ARTERY BYPASS GRAFTING (CABG) x4 , USING LEFT INTERNAL MAMMARY ARTERY AND RIGHT GREATER SAPHENOUS VEIN HARVESTED ENDOSCOPICALLY (N/A) TRANSESOPHAGEAL ECHOCARDIOGRAM (TEE) (N/A)  1 doing well 2 d/c pacer wires today 3 labs are stable 4 sugars controlled- needs good dietary maagement 5 routine pulm toilet/rehab 6 labs are stable 7 prob d/c in am   LOS: 5 days    Rowe ClackWayne E Gold 01/09/2017

## 2017-01-09 NOTE — Progress Notes (Signed)
EPW d/c'd per order and per protocol. Tips intact. VSS. Pt educated on need for q15m vitals and bedrest x1h. Call bell and phone within reach. Will continue to monitor. 

## 2017-01-09 NOTE — Op Note (Signed)
NAME:  Milinda AntisHUNTER, Mikeal                ACCOUNT NO.:  [id number]  MEDICAL RECORD NO.:  [id number]  LOCATION:                                 FACILITY:  PHYSICIAN:  Kerin PernaPeter Van Trigt, M.D.  DATE OF BIRTH:  1955/09/04  DATE OF PROCEDURE:  01/06/2017 DATE OF DISCHARGE:                              OPERATIVE REPORT   OPERATION:  Placement of right chest tube.  SURGEON:  Kerin PernaPeter Van Trigt, MD.  PREOPERATIVE DIAGNOSIS:  20% right pneumothorax, status post coronary artery bypass grafting.  POSTOPERATIVE DIAGNOSIS:  20% right pneumothorax, status post coronary artery bypass grafting.  ANESTHESIA:  Local 1% lidocaine with IV fentanyl with monitored sedation.  DESCRIPTION OF PROCEDURE:  I discussed the procedure of right chest tube placement with the patient's wife shortly after the patient arrived in ICU after CABG x4.  He was hemodynamically stable with normal oxygenation, however, his postop chest x-ray showed a right 20% apical pneumothorax.  I explained the rationale for the procedure to the patient's wife.  A proper time-out was performed after the right chest was prepped and draped as a sterile field.  Local 1% lidocaine was infiltrated in the anterior axillary line at the fifth interspace.  A small incision was made.  Further lidocaine was infiltrated down to the intercostal muscle. Pleural space was entered with a hemostat and air exited.  Through this small incision, a 20-French trocar chest tube was directed in the pleural space and directed to the apex and the trocar was removed.  The chest tube was connected to a water seal Pleur-evac drainage system, secured with 2 silk sutures and a sterile dressing was applied.  A portable chest x-ray is pending.  The patient tolerated the procedure well.     Kerin PernaPeter Van Trigt, M.D.     PV/MEDQ  D:  01/06/2017  T:  01/06/2017  Job:  [id number]

## 2017-01-10 MED ORDER — MUPIROCIN 2 % EX OINT: 1.0000 "application " | TOPICAL_OINTMENT | Freq: Two times a day (BID) | CUTANEOUS | 0 refills | Status: DC

## 2017-01-10 MED ORDER — OXYCODONE HCL 5 MG PO TABS: ORAL_TABLET | ORAL | 0 refills | Status: DC

## 2017-01-10 MED ORDER — ATORVASTATIN CALCIUM 80 MG PO TABS: 80.0000 mg | ORAL_TABLET | Freq: Every day | ORAL | 1 refills | Status: DC

## 2017-01-10 MED ORDER — POTASSIUM CHLORIDE CRYS ER 20 MEQ PO TBCR: 20.0000 meq | EXTENDED_RELEASE_TABLET | Freq: Every day | ORAL | 0 refills | Status: DC

## 2017-01-10 MED ORDER — FUROSEMIDE 40 MG PO TABS: 40.0000 mg | ORAL_TABLET | Freq: Every day | ORAL | 0 refills | Status: DC

## 2017-01-10 MED ORDER — METOPROLOL TARTRATE 25 MG PO TABS: 25.0000 mg | ORAL_TABLET | Freq: Once | ORAL | 1 refills | Status: DC

## 2017-01-10 MED ORDER — OXYCODONE HCL 5 MG PO TABS
ORAL_TABLET | ORAL | 0 refills | Status: DC
Start: 2017-01-10 — End: 2017-01-10

## 2017-01-10 MED ORDER — ASPIRIN 325 MG PO TBEC: 325.0000 mg | DELAYED_RELEASE_TABLET | Freq: Every day | ORAL | 0 refills | Status: DC

## 2017-01-10 NOTE — Progress Notes (Addendum)
      [address]       [place] [id number]             [phone]        4 Days Post-Op Procedure(s) (LRB): CORONARY ARTERY BYPASS GRAFTING (CABG) x4 , USING LEFT INTERNAL MAMMARY ARTERY AND RIGHT GREATER SAPHENOUS VEIN HARVESTED ENDOSCOPICALLY (N/A) TRANSESOPHAGEAL ECHOCARDIOGRAM (TEE) (N/A)  Subjective: Patient without complaints, states he feels well and would like to go home.  Objective: Vital signs in last 24 hours: Temp:  [98 F (36.7 C)-98.6 F (37 C)] 98.6 F (37 C) (12/15 0459) Pulse Rate:  [57-102] 88 (12/15 0459) Cardiac Rhythm: Normal sinus rhythm (12/15 0700) Resp:  [12-18] 13 (12/15 0459) BP: (106-127)/(80-93) 106/80 (12/15 0459) SpO2:  [90 %-92 %] 92 % (12/15 0459) Weight:  [173 lb 8 oz (78.7 kg)] 173 lb 8 oz (78.7 kg) (12/15 0459)  Pre op weight 78 kg Current Weight  01/10/17 173 lb 8 oz (78.7 kg)      Intake/Output from previous day: No intake/output data recorded.   Physical Exam:  Cardiovascular: RRR Pulmonary: Clear to auscultation bilaterally Abdomen: Soft, non tender, bowel sounds present. Extremities: Mild lower extremity edema. Wounds: Clean and dry.  No erythema or signs of infection.  Lab Results: CBC: Recent Labs    01/08/17 0455 01/09/17 0245  WBC 9.9 10.8*  HGB 11.1* 12.2*  HCT 34.0* 37.0*  PLT 112* 126*   BMET:  Recent Labs    01/08/17 0455 01/09/17 0245  NA 137 137  K 3.8 3.8  CL 103 104  CO2 28 26  GLUCOSE 99 109*  BUN 9 9  CREATININE 0.88 0.94  CALCIUM 8.5* 8.6*    PT/INR:  Lab Results  Component Value Date   INR 1.32 01/06/2017   INR 1.09 01/06/2017   INR 1.09 01/04/2017   ABG:  INR: Will add last result for INR, ABG once components are confirmed Will add last 4 CBG results once components are confirmed  Assessment/Plan:  1. CV - SR in the 80's this am. On Lopressor 25 mg bid. Will try to start ACE/ARB as outpatient as BP too labile at this time. 2.  Pulmonary - On room  air. 3. Volume Overload - On Lasix 40 mg daily. 4.  Acute blood loss anemia - Last H and H stable at 12.2 and 37. 5. Mild thrombocytopenia-last platelets up to 126,000 6. Remove chest tube sutures 7. As discussed with Dr. Donata ClayVan Trigt, discharge today  Donielle M ZimmermanPA-C 01/10/2017,10:06 AM

## 2017-01-10 NOTE — Progress Notes (Signed)
Order received to discharge patient.  Telemetry monitor removed and CCMD notified.  PIV access removed.  Discharge instructions, follow up, medications and instructions for their use were discussed with patient. 

## 2017-01-10 NOTE — Progress Notes (Signed)
Chest tube sutures removed per MD orders without difficulty.  Will continue to monitor. 

## 2017-01-15 ENCOUNTER — Telehealth (HOSPITAL_COMMUNITY): Payer: Self-pay

## 2017-01-15 NOTE — Telephone Encounter (Signed)
Patients insurance is active and benefits verified through [facility] - $30.00 co-pay, no deductible, out of pocket amount of $7,00/$7,000 has been met, no co-insurance, and no pre-authorization is required. Passport/reference [phone]  Patient will be contacted and scheduled after their follow up appt with the Cardiologist office upon review by the RN Navigator.

## 2017-01-28 ENCOUNTER — Encounter: Payer: Self-pay | Admitting: Cardiology

## 2017-01-28 ENCOUNTER — Ambulatory Visit: Payer: Managed Care, Other (non HMO) | Admitting: Cardiology

## 2017-01-28 VITALS — BP 116/82 | HR 68 | Ht 70.0 in | Wt 169.8 lb

## 2017-01-28 DIAGNOSIS — I2510 Atherosclerotic heart disease of native coronary artery without angina pectoris: Secondary | ICD-10-CM | POA: Diagnosis not present

## 2017-01-28 DIAGNOSIS — Z951 Presence of aortocoronary bypass graft: Secondary | ICD-10-CM | POA: Diagnosis not present

## 2017-01-28 MED ORDER — METOPROLOL TARTRATE 25 MG PO TABS: 25.0000 mg | ORAL_TABLET | Freq: Two times a day (BID) | ORAL | 0 refills | Status: DC

## 2017-01-28 MED ORDER — ATORVASTATIN CALCIUM 80 MG PO TABS: 80.0000 mg | ORAL_TABLET | Freq: Every day | ORAL | 0 refills | Status: DC

## 2017-01-28 NOTE — Patient Instructions (Addendum)
Medication Instructions:  Your physician has recommended you make the following change in your medication: TAKE Metoprolol (Lopressor) 25 mg twice daily   Labwork: Your physician recommends that you return for lab work in: 3 weeks  You will need to FAST for this appointment - nothing to eat or drink after midnight the night before except water.    Testing/Procedures: None Ordered   Follow-Up: Your physician recommends that you schedule a follow-up appointment in: 2-3 months with Dr. Mayford Knifeurner   If you need a refill on your cardiac medications before your next appointment, please call your pharmacy.   Thank you for choosing [facility]! Eligha BridegroomMichelle Amirra Herling, RN [phone]

## 2017-01-28 NOTE — Progress Notes (Signed)
01/28/2017 [name]   [birth year]  [id number]  Primary Physician Kathyrn Lass, MD Primary Cardiologist: Dr. Radford Pax   Reason for Visit/CC: [facility] F/u for CAD s/p CABG  HPI:  Logan Wells is a 62 y.o. male who is being seen today for post hospital f/u. He is followed by Dr. Radford Pax and was initially referred by PCP for chest pain. He first had an abnormal ETT and was set up for coronary CTA 12/31/16 which showed Coronary artery calcium score of 1562 Agatston units and severe stenosis in the proximal to mid LAD and the left PDA. Possible moderate stenosis in the non-dominant RCA. Plan was for elective LHC, however pt presented to the ED on 01/04/17 with unstable symptoms and ruled in for NSTEMI. He underwent LHC by Dr. Martinique and was found to have severe multivessel disease w/ normal LVEF and underwent CABG x 4 by Dr. Prescott Gum on 01/06/17 with LIMA-LAD, SVG-Diag, SVG-OM and SVG-PDB of distal Cx. His post op recovery was fairly uneventfully. No arrthymias. He had mild hypervolemia treated with short course of Lasix. He was placed on ASA, BB and statin therapy. LDL was 101 mg/dL.   He presents to clinic today for post hospital f/u. He is here with his wife. Doing well. No recurrent angina. Sternal and vein harvest site is healing well. No LEE. No palpitations. He is walking 3x/day, 15 min walks. EKG shows NSR. HR and BP well controlled. Reports full med compliance. No side effects.   LHC 01/05/17 Conclusion     Prox RCA to Mid RCA lesion is 90% stenosed.  Mid RCA to Dist RCA lesion is 90% stenosed.  Ost LAD lesion is 90% stenosed.  Prox LAD lesion is 95% stenosed.  Prox LAD to Mid LAD lesion is 50% stenosed.  Ost 1st Mrg lesion is 60% stenosed.  Dist Cx lesion is 90% stenosed.  Ost LPDA to LPDA lesion is 80% stenosed.  The left ventricular systolic function is normal.  LV end diastolic pressure is normal.  The left ventricular ejection fraction is 55-65% by visual  estimate.   1. 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   - 90% ostial LAD   - 95% proximal LAD at diagonal bifurcation   - 90% distal LCx with long segmental disease extending into PDA   - 90% nondominant RCA 2. Good LV function 3. Normal LVEDP     Current Meds  Medication Sig  . alum & mag hydroxide-simeth (MAALOX/MYLANTA) 200-200-20 MG/5ML suspension Take 15 mLs by mouth every 6 (six) hours as needed for indigestion or heartburn.  Marland Kitchen aspirin EC 325 MG EC tablet Take 1 tablet (325 mg total) by mouth daily.  Marland Kitchen atorvastatin (LIPITOR) 80 MG tablet Take 1 tablet (80 mg total) by mouth daily at 6 PM.  . calcium carbonate (TUMS - DOSED IN MG ELEMENTAL CALCIUM) 500 MG chewable tablet Chew 1 tablet by mouth daily as needed for indigestion or heartburn.  . esomeprazole (NEXIUM) 10 MG packet Take 10 mg by mouth daily before breakfast.  . furosemide (LASIX) 40 MG tablet Take 1 tablet (40 mg total) by mouth daily. For 5 days then stop.  . mupirocin ointment (BACTROBAN) 2 % Place 1 application into the nose 2 (two) times daily. For one day then stop.  Marland Kitchen oxyCODONE (OXY IR/ROXICODONE) 5 MG immediate release tablet Take 5 mg by mouth every 4-6 hours PRN severe pain.  . potassium chloride SA (K-DUR,KLOR-CON) 20 MEQ tablet Take 1 tablet (20 mEq total)  by mouth daily. For 5 days then stop.   No Known Allergies Past Medical History:  Diagnosis Date  . Arthritis   . Chest pain 11/19/2016  . GERD (gastroesophageal reflux disease)   . High cholesterol   . Hypertension    Family History  Problem Relation Age of Onset  . Heart disease Mother   . Heart attack Mother 20  . CAD Mother   . Multiple myeloma Father    Past Surgical History:  Procedure Laterality Date  . CORONARY ARTERY BYPASS GRAFT N/A 01/06/2017   Procedure: CORONARY ARTERY BYPASS GRAFTING (CABG) x4 , USING LEFT INTERNAL MAMMARY ARTERY AND RIGHT GREATER SAPHENOUS VEIN HARVESTED ENDOSCOPICALLY;  Surgeon: Ivin Poot, MD;   Location: [facility];  Service: Open Heart Surgery;  Laterality: N/A;  . LEFT HEART CATH AND CORONARY ANGIOGRAPHY N/A 01/05/2017   Procedure: LEFT HEART CATH AND CORONARY ANGIOGRAPHY;  Surgeon: Martinique, Peter M, MD;  Location: [facility];  Service: Cardiovascular;  Laterality: N/A;  . SHOULDER SURGERY     Left  . TEE WITHOUT CARDIOVERSION N/A 01/06/2017   Procedure: TRANSESOPHAGEAL ECHOCARDIOGRAM (TEE);  Surgeon: Prescott Gum, Collier Salina, MD;  Location: [facility];  Service: Open Heart Surgery;  Laterality: N/A;   Social History   Socioeconomic History  . Marital status: Married    Spouse name: Not on file  . Number of children: Not on file  . Years of education: Not on file  . Highest education level: Not on file  Social Needs  . Financial resource strain: Not on file  . Food insecurity - worry: Not on file  . Food insecurity - inability: Not on file  . Transportation needs - medical: Not on file  . Transportation needs - non-medical: Not on file  Occupational History  . Not on file  Tobacco Use  . Smoking status: Never Smoker  . Smokeless tobacco: Never Used  Substance and Sexual Activity  . Alcohol use: Yes    Alcohol/week: 1.2 oz    Types: 2 Glasses of wine per week    Comment: daily  . Drug use: No  . Sexual activity: Not on file  Other Topics Concern  . Not on file  Social History Narrative  . Not on file     Review of Systems: General: negative for chills, fever, night sweats or weight changes.  Cardiovascular: negative for chest pain, dyspnea on exertion, edema, orthopnea, palpitations, paroxysmal nocturnal dyspnea or shortness of breath Dermatological: negative for rash Respiratory: negative for cough or wheezing Urologic: negative for hematuria Abdominal: negative for nausea, vomiting, diarrhea, bright red blood per rectum, melena, or hematemesis Neurologic: negative for visual changes, syncope, or dizziness All other systems reviewed and are otherwise negative except  as noted above.   Physical Exam:  Blood pressure 116/82, pulse 68, height 5' 10"  (1.778 m), weight 169 lb 12.8 oz (77 kg), SpO2 97 %.  General appearance: alert, cooperative and no distress Neck: no carotid bruit and no JVD Lungs: clear to auscultation bilaterally Heart: regular rate and rhythm, S1, S2 normal, no murmur, click, rub or gallop Extremities: extremities normal, atraumatic, no cyanosis or edema Pulses: 2+ and symmetric Skin: Skin color, texture, turgor normal. No rashes or lesions Neurologic: Grossly normal  EKG NSR -- personally reviewed    ASSESSMENT AND PLAN:   1. CAD: s/p CABG x 4 on 01/06/17 per Dr. Prescott Gum. LIMA-LAD, SVG-Diag, SVG-OM and SVG-PDB of distal Cx. EF normal. Progressing well. No anginal symptoms. HR and  BP well controlled. Continue ASA, statin and BB therapy. Recommend cardiac rehab once cleared by Dr. Prescott Gum.   2. HLD: LDL 101 mg/dL. Goal LDL given multivessel CAD s/p CABG is < 70 mg/dL. High dose statin therapy, Lipitor 80 mg, initiated during hospitalization. He is due for repeat FLP in 3 more weeks. Will also check HFTs. If LDL is not at goal, will add Zetia +/- PCSK9 inhibitor therapy.   Follow-Up w/ Dr. Radford Pax in 2-3 months  Diasha Castleman Ladoris Gene, MHS [facility] 01/28/2017 11:06 AM

## 2017-01-30 ENCOUNTER — Telehealth (HOSPITAL_COMMUNITY): Payer: Self-pay

## 2017-01-30 MED FILL — Sodium Bicarbonate IV Soln 8.4%: INTRAVENOUS | Qty: 50 | Status: AC

## 2017-01-30 MED FILL — Electrolyte-R (PH 7.4) Solution: INTRAVENOUS | Qty: 4000 | Status: AC

## 2017-01-30 MED FILL — Heparin Sodium (Porcine) Inj 1000 Unit/ML: INTRAMUSCULAR | Qty: 20 | Status: AC

## 2017-01-30 MED FILL — Mannitol IV Soln 20%: INTRAVENOUS | Qty: 500 | Status: AC

## 2017-01-30 MED FILL — Lidocaine HCl IV Inj 20 MG/ML: INTRAVENOUS | Qty: 5 | Status: AC

## 2017-01-30 MED FILL — Sodium Chloride IV Soln 0.9%: INTRAVENOUS | Qty: 2000 | Status: AC

## 2017-01-30 NOTE — Telephone Encounter (Signed)
Called and spoke with patient in regards to Cardiac Rehab - Patient is interested. Scheduled orientation on 03/10/2017 at 8:00am. Patient will attend the 8:15am exc class.

## 2017-02-18 ENCOUNTER — Other Ambulatory Visit: Payer: Managed Care, Other (non HMO) | Admitting: *Deleted

## 2017-02-18 ENCOUNTER — Ambulatory Visit: Payer: 59 | Admitting: Cardiothoracic Surgery

## 2017-02-18 DIAGNOSIS — R079 Chest pain, unspecified: Secondary | ICD-10-CM

## 2017-02-18 DIAGNOSIS — Z951 Presence of aortocoronary bypass graft: Secondary | ICD-10-CM

## 2017-02-18 DIAGNOSIS — I2510 Atherosclerotic heart disease of native coronary artery without angina pectoris: Secondary | ICD-10-CM

## 2017-02-18 LAB — HEPATIC FUNCTION PANEL
ALT: 28 IU/L (ref 0–44)
AST: 24 IU/L (ref 0–40)
Albumin: 4.6 g/dL (ref 3.5–5.5)
Alkaline Phosphatase: 73 IU/L (ref 39–117)
BILIRUBIN TOTAL: 0.3 mg/dL (ref 0.0–1.2)
Bilirubin, Direct: 0.06 mg/dL (ref 0.00–0.40)
Total Protein: 7.6 g/dL (ref 6.0–8.5)

## 2017-02-18 LAB — LIPID PANEL
CHOLESTEROL TOTAL: 206 mg/dL — AB (ref 100–199)
Chol/HDL Ratio: 3.8 ratio (ref 0.0–5.0)
HDL: 54 mg/dL (ref >39)
LDL CALC: 121 mg/dL — AB (ref 0–99)
TRIGLYCERIDES: 156 mg/dL — AB (ref 0–149)
VLDL Cholesterol Cal: 31 mg/dL (ref 5–40)

## 2017-02-18 LAB — BASIC METABOLIC PANEL
BUN / CREAT RATIO: 11 (ref 9–20)
BUN: 10 mg/dL (ref 6–24)
CALCIUM: 9.6 mg/dL (ref 8.7–10.2)
CHLORIDE: 101 mmol/L (ref 96–106)
CO2: 24 mmol/L (ref 20–29)
Creatinine, Ser: 0.91 mg/dL (ref 0.76–1.27)
GFR calc Af Amer: 106 mL/min/{1.73_m2} (ref >59)
GFR calc non Af Amer: 92 mL/min/{1.73_m2} (ref >59)
Glucose: 103 mg/dL — ABNORMAL HIGH (ref 65–99)
POTASSIUM: 4.8 mmol/L (ref 3.5–5.2)
Sodium: 140 mmol/L (ref 134–144)

## 2017-02-20 ENCOUNTER — Telehealth: Payer: Self-pay | Admitting: Cardiology

## 2017-02-20 NOTE — Telephone Encounter (Signed)
Returned call to patient regarding lab results(lipids). Patient stated he spoke with his vascular surgeon regarding leg aches and cramps and was advised to stop lipitor for a few weeks and see if symptoms improve. Patient stop taking Lipitor about 10 days ago. Patient stated he has a follow up appt with MD on 03/02/17 to discuss restarting simvastatin. Informed patient I would send to Boyce MediciBrittany Simmons, PA and Dr. Mayford Knifeurner. Patient verbalized  Understanding and thanked me for the call.

## 2017-02-20 NOTE — Telephone Encounter (Signed)
Follow Up: ° ° ° ° ° °Returning your call from yesterday, concerning his lab results. °

## 2017-02-23 NOTE — Telephone Encounter (Signed)
If unable to tolerate Lipitor then can try restarting simvastatin and recheck FLP in 6 weeks. If not at goal, then can refer to lipid clinic for PCSK9 inhibitor therapy.

## 2017-02-26 ENCOUNTER — Other Ambulatory Visit: Payer: Self-pay | Admitting: *Deleted

## 2017-02-26 DIAGNOSIS — Z951 Presence of aortocoronary bypass graft: Secondary | ICD-10-CM

## 2017-03-02 ENCOUNTER — Ambulatory Visit: Payer: Self-pay

## 2017-03-03 ENCOUNTER — Ambulatory Visit
Admission: RE | Admit: 2017-03-03 | Discharge: 2017-03-03 | Disposition: A | Discharge status: Home / Self Care | Payer: Managed Care, Other (non HMO) | Source: Ambulatory Visit | Attending: Cardiothoracic Surgery | Admitting: Cardiothoracic Surgery

## 2017-03-03 ENCOUNTER — Encounter: Payer: Self-pay | Admitting: Physician Assistant

## 2017-03-03 ENCOUNTER — Ambulatory Visit (INDEPENDENT_AMBULATORY_CARE_PROVIDER_SITE_OTHER): Payer: Self-pay | Admitting: Physician Assistant

## 2017-03-03 ENCOUNTER — Telehealth (HOSPITAL_COMMUNITY): Payer: Self-pay | Admitting: Pharmacist

## 2017-03-03 VITALS — BP 150/95 | HR 65 | Resp 18 | Ht 70.0 in | Wt 172.0 lb

## 2017-03-03 DIAGNOSIS — Z951 Presence of aortocoronary bypass graft: Secondary | ICD-10-CM

## 2017-03-03 DIAGNOSIS — I2510 Atherosclerotic heart disease of native coronary artery without angina pectoris: Secondary | ICD-10-CM

## 2017-03-03 NOTE — Telephone Encounter (Signed)
Left message to call back  

## 2017-03-03 NOTE — Patient Instructions (Addendum)
  You may continue to gradually increase your physical activity as tolerated.  Refrain from any heavy lifting or strenuous use of your arms and shoulders until at least 8 weeks from the time of your surgery, and avoid activities that cause increased pain in your chest on the side of your surgical incision.  Otherwise you may continue to increase activities without any particular limitations.  Increase the intensity and duration of physical activity gradually.  You are encouraged to enroll and participate in the outpatient cardiac rehab program beginning as soon as practical. 

## 2017-03-03 NOTE — Telephone Encounter (Signed)
Called patient to follow up on medication therapy. Patient stated Dr. Kathlee NationsPeter Van Trigt started him back on simvastatin 40 mg about a 1 week ago. Patient states he has a follow up appt with Dr. Maudie Flakesrigt today. Patient stated he will call back to schedule labs if he doesn't get labs scheduled with Dr. Paula Librarigt's office. Patient verbalized understanding and thanked me for the call.

## 2017-03-03 NOTE — Telephone Encounter (Signed)
F/u call ° °Patient returning call °

## 2017-03-03 NOTE — Progress Notes (Signed)
  HPI:  Patient returns for routine postoperative follow-up having undergone a CABG x 4 01/06/2017 after a NSTEMI by Dr. Donata ClayVan Trigt. Since hospital discharge the patient reports he is already driving and is walking about 3 miles per day. He denies shortness of breath, or chest pain.  Current Outpatient Medications  Medication Sig Dispense Refill  . alum & mag hydroxide-simeth (MAALOX/MYLANTA) 200-200-20 MG/5ML suspension Take 15 mLs by mouth every 6 (six) hours as needed for indigestion or heartburn.    Marland Kitchen. aspirin EC 325 MG EC tablet Take 1 tablet (325 mg total) by mouth daily. 30 tablet 0  . calcium carbonate (TUMS - DOSED IN MG ELEMENTAL CALCIUM) 500 MG chewable tablet Chew 1 tablet by mouth daily as needed for indigestion or heartburn.    . esomeprazole (NEXIUM) 10 MG packet Take 10 mg by mouth daily before breakfast.    . metoprolol tartrate (LOPRESSOR) 25 MG tablet Take 1 tablet (25 mg total) by mouth 2 (two) times daily. 180 tablet 0  . mupirocin ointment (BACTROBAN) 2 % Place 1 application into the nose 2 (two) times daily. For one day then stop. 1 g 0  . oxyCODONE (OXY IR/ROXICODONE) 5 MG immediate release tablet Take 5 mg by mouth every 4-6 hours PRN severe pain. 30 tablet 0  . simvastatin (ZOCOR) 40 MG tablet Take 40 mg by mouth daily.    Marland Kitchen. atorvastatin (LIPITOR) 80 MG tablet Take 1 tablet (80 mg total) by mouth daily at 6 PM. (Patient not taking: Reported on 03/03/2017) 90 tablet 0  Vital Signs: BP 150/95, HR 65, RR 18, Oxygenation 98% on room air.  Physical Exam: CV-RRR Pulmonary-Clear to auscultation bilaterally Extremities-No LE edema Wounds-Small eschar proximal sternal wound.  Diagnostic Tests: EXAM: CHEST  2 VIEW  COMPARISON:  01/09/2017  FINDINGS: Changes of CABG. Heart and mediastinal contours are within normal limits. No focal opacities or effusions. No acute bony abnormality. No pneumothorax  IMPRESSION: CABG.  No active cardiopulmonary  disease.   Electronically Signed   By: Charlett NoseKevin  Dover M.D.   On: 03/03/2017 12:03  Impression and Plan: Overall, [name] has done well recovering from coronary artery bypass grafting surgery. His blood pressure is elevated on this visit, but he states he just had a family meeting. He takes his blood pressure at home and the systolic has not been more than the 120's. He was instructed if his systolic blood pressure increases into the 130's or higher, to contact our office or cardiology as may resume Losartan. He was discharged on Lipitor but had myalgias. He was put on Zocor (which he had taken previously) 40 mg daily and he tolerates this. He has already seen cardiology in follow up and will see the cardiologist again in the spring. Patient states he has already been driving (he is not taking narcotic for pain). He has been contacted by cardiac rehab. He will decide if he wants to participate or not as he is already walking 3 miles most days. Finally, he was instructed he may begin lifting weights but he is to start out no more than 10 pounds. Over the next few weeks, he may increase his frequency and duration as tolerates. He will return to see Dr. Donata ClayVan Trigt in 4-6 weeks.     Ardelle Ballsonielle M Zimmerman, PA-C [facility] [phone]

## 2017-03-03 NOTE — Telephone Encounter (Signed)
Cardiac Rehab Medication Review by a Pharmacist  Does the patient  feel that his/her medications are working for him/her?  yes  Has the patient been experiencing any side effects to the medications prescribed?  Yes. Patient had muscle aches and could not tolerate atorvastatin. Could potentially be  Dose-related; patient now on simvastatin and I informed him about the low, moderate, and high intensity statins. Patient will follow up with doctor to see if they need to readdress.  Does the patient measure his/her own blood pressure or blood glucose at home?  Yes. Normally 130/80s.  Does the patient have any problems obtaining medications due to transportation or finances?   no  Understanding of regimen: good Understanding of indications: good Potential of compliance: good    Pharmacist comments: Patient has been walking 3.5 miles each day. I congratulated him and told him to keep this up. No other questions and concerns from the patient.   Donnella Biyler Aerik Polan, PharmD PGY1 Acute Care Pharmacy Resident Pager: [phone] 03/03/2017 4:55 PM

## 2017-03-09 ENCOUNTER — Other Ambulatory Visit: Payer: Self-pay

## 2017-03-09 ENCOUNTER — Observation Stay (HOSPITAL_COMMUNITY)
Admission: EM | Admit: 2017-03-09 | Discharge: 2017-03-10 | Disposition: A | Discharge status: Home / Self Care | Payer: Managed Care, Other (non HMO) | Attending: Cardiovascular Disease | Admitting: Cardiovascular Disease

## 2017-03-09 ENCOUNTER — Emergency Department (HOSPITAL_COMMUNITY): Payer: Managed Care, Other (non HMO)

## 2017-03-09 ENCOUNTER — Encounter (HOSPITAL_COMMUNITY)
Admission: EM | Disposition: A | Discharge status: Home / Self Care | Payer: Self-pay | Source: Home / Self Care | Attending: Emergency Medicine

## 2017-03-09 ENCOUNTER — Encounter (HOSPITAL_COMMUNITY): Payer: Self-pay | Admitting: Emergency Medicine

## 2017-03-09 DIAGNOSIS — Z951 Presence of aortocoronary bypass graft: Secondary | ICD-10-CM

## 2017-03-09 DIAGNOSIS — Z8249 Family history of ischemic heart disease and other diseases of the circulatory system: Secondary | ICD-10-CM | POA: Insufficient documentation

## 2017-03-09 DIAGNOSIS — E7800 Pure hypercholesterolemia, unspecified: Secondary | ICD-10-CM | POA: Diagnosis not present

## 2017-03-09 DIAGNOSIS — K219 Gastro-esophageal reflux disease without esophagitis: Secondary | ICD-10-CM | POA: Insufficient documentation

## 2017-03-09 DIAGNOSIS — Z7982 Long term (current) use of aspirin: Secondary | ICD-10-CM | POA: Insufficient documentation

## 2017-03-09 DIAGNOSIS — I10 Essential (primary) hypertension: Secondary | ICD-10-CM | POA: Diagnosis not present

## 2017-03-09 DIAGNOSIS — M1990 Unspecified osteoarthritis, unspecified site: Secondary | ICD-10-CM | POA: Insufficient documentation

## 2017-03-09 DIAGNOSIS — I252 Old myocardial infarction: Secondary | ICD-10-CM | POA: Diagnosis not present

## 2017-03-09 DIAGNOSIS — I214 Non-ST elevation (NSTEMI) myocardial infarction: Secondary | ICD-10-CM | POA: Diagnosis not present

## 2017-03-09 DIAGNOSIS — I200 Unstable angina: Secondary | ICD-10-CM | POA: Diagnosis not present

## 2017-03-09 DIAGNOSIS — I25110 Atherosclerotic heart disease of native coronary artery with unstable angina pectoris: Principal | ICD-10-CM | POA: Insufficient documentation

## 2017-03-09 DIAGNOSIS — Z888 Allergy status to other drugs, medicaments and biological substances status: Secondary | ICD-10-CM | POA: Diagnosis not present

## 2017-03-09 DIAGNOSIS — I2510 Atherosclerotic heart disease of native coronary artery without angina pectoris: Secondary | ICD-10-CM | POA: Diagnosis present

## 2017-03-09 DIAGNOSIS — R079 Chest pain, unspecified: Secondary | ICD-10-CM

## 2017-03-09 DIAGNOSIS — Z79899 Other long term (current) drug therapy: Secondary | ICD-10-CM | POA: Insufficient documentation

## 2017-03-09 DIAGNOSIS — Z808 Family history of malignant neoplasm of other organs or systems: Secondary | ICD-10-CM | POA: Insufficient documentation

## 2017-03-09 HISTORY — PX: ULTRASOUND GUIDANCE FOR VASCULAR ACCESS: SHX6516

## 2017-03-09 HISTORY — PX: LEFT HEART CATH AND CORS/GRAFTS ANGIOGRAPHY: CATH118250

## 2017-03-09 HISTORY — DX: Non-ST elevation (NSTEMI) myocardial infarction: I21.4

## 2017-03-09 HISTORY — DX: Atherosclerotic heart disease of native coronary artery without angina pectoris: I25.10

## 2017-03-09 LAB — CBC
HCT: 45.6 % (ref 39.0–52.0)
Hemoglobin: 15.2 g/dL (ref 13.0–17.0)
MCH: 31.3 pg (ref 26.0–34.0)
MCHC: 33.3 g/dL (ref 30.0–36.0)
MCV: 94 fL (ref 78.0–100.0)
PLATELETS: 243 10*3/uL (ref 150–400)
RBC: 4.85 MIL/uL (ref 4.22–5.81)
RDW: 13.7 % (ref 11.5–15.5)
WBC: 6.3 10*3/uL (ref 4.0–10.5)

## 2017-03-09 LAB — BASIC METABOLIC PANEL
Anion gap: 14 (ref 5–15)
BUN: 8 mg/dL (ref 6–20)
CO2: 23 mmol/L (ref 22–32)
CREATININE: 0.99 mg/dL (ref 0.61–1.24)
Calcium: 9.5 mg/dL (ref 8.9–10.3)
Chloride: 103 mmol/L (ref 101–111)
GFR calc Af Amer: >60 mL/min (ref >60)
GFR calc non Af Amer: >60 mL/min (ref >60)
GLUCOSE: 113 mg/dL — AB (ref 65–99)
Potassium: 3.8 mmol/L (ref 3.5–5.1)
SODIUM: 140 mmol/L (ref 135–145)

## 2017-03-09 LAB — TSH: TSH: 1.267 u[IU]/mL (ref 0.350–4.500)

## 2017-03-09 LAB — PROTIME-INR
INR: 1
PROTHROMBIN TIME: 13.1 s (ref 11.4–15.2)

## 2017-03-09 LAB — I-STAT TROPONIN, ED: Troponin i, poc: 0 ng/mL (ref 0.00–0.08)

## 2017-03-09 LAB — MAGNESIUM: Magnesium: 1.9 mg/dL (ref 1.7–2.4)

## 2017-03-09 LAB — TROPONIN I
Troponin I: <0.03 ng/mL (ref <0.03)
Troponin I: <0.03 ng/mL

## 2017-03-09 LAB — D-DIMER, QUANTITATIVE: D-Dimer, Quant: 0.91 ug{FEU}/mL — ABNORMAL HIGH (ref 0.00–0.50)

## 2017-03-09 SURGERY — LEFT HEART CATH AND CORS/GRAFTS ANGIOGRAPHY
Anesthesia: LOCAL

## 2017-03-09 MED ORDER — NITROGLYCERIN 2 % TD OINT
1.0000 [in_us] | TOPICAL_OINTMENT | Freq: Four times a day (QID) | TRANSDERMAL | Status: DC
  Administered 2017-03-09 – 2017-03-10 (×2): 1 [in_us] via TOPICAL
  Filled 2017-03-09: qty 1
  Filled 2017-03-09: qty 30

## 2017-03-09 MED ORDER — HEPARIN (PORCINE) IN NACL 2-0.9 UNIT/ML-% IJ SOLN
INTRAMUSCULAR | Status: DC | PRN
  Administered 2017-03-09: 10 mL via INTRA_ARTERIAL

## 2017-03-09 MED ORDER — FENTANYL CITRATE (PF) 100 MCG/2ML IJ SOLN
INTRAMUSCULAR | Status: DC | PRN
  Administered 2017-03-09: 50 ug via INTRAVENOUS

## 2017-03-09 MED ORDER — ASPIRIN 81 MG PO CHEW: 81.0000 mg | CHEWABLE_TABLET | ORAL | Status: DC

## 2017-03-09 MED ORDER — METOPROLOL TARTRATE 25 MG PO TABS
25.0000 mg | ORAL_TABLET | Freq: Two times a day (BID) | ORAL | Status: DC
  Administered 2017-03-09 – 2017-03-10 (×3): 25 mg via ORAL
  Filled 2017-03-09 (×3): qty 1

## 2017-03-09 MED ORDER — ONDANSETRON HCL 4 MG/2ML IJ SOLN: 4.0000 mg | Freq: Four times a day (QID) | INTRAMUSCULAR | Status: DC | PRN

## 2017-03-09 MED ORDER — MIDAZOLAM HCL 2 MG/2ML IJ SOLN
INTRAMUSCULAR | Status: DC | PRN
  Administered 2017-03-09 (×2): 1 mg via INTRAVENOUS

## 2017-03-09 MED ORDER — SODIUM CHLORIDE 0.9 % WEIGHT BASED INFUSION: 1.0000 mL/kg/h | INTRAVENOUS | Status: DC

## 2017-03-09 MED ORDER — SODIUM CHLORIDE 0.9 % IV SOLN: INTRAVENOUS | Status: DC

## 2017-03-09 MED ORDER — SODIUM CHLORIDE 0.9 % WEIGHT BASED INFUSION: 1.5000 mL/kg/h | INTRAVENOUS | Status: AC

## 2017-03-09 MED ORDER — SODIUM CHLORIDE 0.9% FLUSH
3.0000 mL | Freq: Two times a day (BID) | INTRAVENOUS | Status: DC
  Administered 2017-03-10: 01:00:00 3 mL via INTRAVENOUS

## 2017-03-09 MED ORDER — SODIUM CHLORIDE 0.9 % WEIGHT BASED INFUSION: 3.0000 mL/kg/h | INTRAVENOUS | Status: DC

## 2017-03-09 MED ORDER — SODIUM CHLORIDE 0.9 % IV SOLN: 250.0000 mL | INTRAVENOUS | Status: DC | PRN

## 2017-03-09 MED ORDER — IOPAMIDOL (ISOVUE-370) INJECTION 76%
INTRAVENOUS | Status: AC
  Filled 2017-03-09: qty 150

## 2017-03-09 MED ORDER — NITROGLYCERIN 0.4 MG SL SUBL: 0.4000 mg | SUBLINGUAL_TABLET | SUBLINGUAL | Status: DC | PRN

## 2017-03-09 MED ORDER — IOPAMIDOL (ISOVUE-370) INJECTION 76%
INTRAVENOUS | Status: AC
  Filled 2017-03-09: qty 100

## 2017-03-09 MED ORDER — LIDOCAINE HCL 1 % IJ SOLN
INTRAMUSCULAR | Status: AC
  Filled 2017-03-09: qty 20

## 2017-03-09 MED ORDER — IOPAMIDOL (ISOVUE-370) INJECTION 76%
INTRAVENOUS | Status: AC
  Filled 2017-03-09: qty 50

## 2017-03-09 MED ORDER — FENTANYL CITRATE (PF) 100 MCG/2ML IJ SOLN
INTRAMUSCULAR | Status: AC
  Filled 2017-03-09: qty 2

## 2017-03-09 MED ORDER — HEPARIN (PORCINE) IN NACL 2-0.9 UNIT/ML-% IJ SOLN
INTRAMUSCULAR | Status: AC
  Filled 2017-03-09: qty 1000

## 2017-03-09 MED ORDER — IOPAMIDOL (ISOVUE-370) INJECTION 76%
INTRAVENOUS | Status: DC | PRN
  Administered 2017-03-09: 150 mL via INTRA_ARTERIAL

## 2017-03-09 MED ORDER — ASPIRIN 81 MG PO CHEW
324.0000 mg | CHEWABLE_TABLET | ORAL | Status: AC
  Administered 2017-03-09: 324 mg via ORAL
  Filled 2017-03-09: qty 4

## 2017-03-09 MED ORDER — LIDOCAINE HCL (PF) 1 % IJ SOLN
INTRAMUSCULAR | Status: DC | PRN
  Administered 2017-03-09: 2 mL

## 2017-03-09 MED ORDER — SODIUM CHLORIDE 0.9% FLUSH: 3.0000 mL | INTRAVENOUS | Status: DC | PRN

## 2017-03-09 MED ORDER — NITROGLYCERIN 0.4 MG SL SUBL
0.4000 mg | SUBLINGUAL_TABLET | Freq: Once | SUBLINGUAL | Status: AC
  Administered 2017-03-09: 0.4 mg via SUBLINGUAL
  Filled 2017-03-09: qty 1

## 2017-03-09 MED ORDER — MIDAZOLAM HCL 2 MG/2ML IJ SOLN
INTRAMUSCULAR | Status: AC
  Filled 2017-03-09: qty 2

## 2017-03-09 MED ORDER — ACETAMINOPHEN 325 MG PO TABS: 650.0000 mg | ORAL_TABLET | ORAL | Status: DC | PRN

## 2017-03-09 MED ORDER — HEPARIN SODIUM (PORCINE) 5000 UNIT/ML IJ SOLN
5000.0000 [IU] | Freq: Three times a day (TID) | INTRAMUSCULAR | Status: DC
  Administered 2017-03-09 – 2017-03-10 (×2): 5000 [IU] via SUBCUTANEOUS
  Filled 2017-03-09 (×2): qty 1

## 2017-03-09 MED ORDER — SODIUM CHLORIDE 0.9% FLUSH: 3.0000 mL | Freq: Two times a day (BID) | INTRAVENOUS | Status: DC

## 2017-03-09 MED ORDER — HEPARIN (PORCINE) IN NACL 2-0.9 UNIT/ML-% IJ SOLN
INTRAMUSCULAR | Status: AC | PRN
  Administered 2017-03-09: 1000 mL

## 2017-03-09 MED ORDER — HEPARIN (PORCINE) IN NACL 100-0.45 UNIT/ML-% IJ SOLN
1000.0000 [IU]/h | INTRAMUSCULAR | Status: DC
  Filled 2017-03-09 (×2): qty 250

## 2017-03-09 MED ORDER — HEPARIN SODIUM (PORCINE) 1000 UNIT/ML IJ SOLN
INTRAMUSCULAR | Status: DC | PRN
  Administered 2017-03-09: 4000 [IU] via INTRAVENOUS

## 2017-03-09 MED ORDER — HEPARIN (PORCINE) IN NACL 100-0.45 UNIT/ML-% IJ SOLN
1000.0000 [IU]/h | INTRAMUSCULAR | Status: DC
Start: 2017-03-09 — End: 2017-03-09
  Administered 2017-03-09: 1000 [IU]/h via INTRAVENOUS
  Filled 2017-03-09: qty 250

## 2017-03-09 MED ORDER — ASPIRIN 300 MG RE SUPP: 300.0000 mg | RECTAL | Status: AC

## 2017-03-09 MED ORDER — ASPIRIN 81 MG PO CHEW
81.0000 mg | CHEWABLE_TABLET | Freq: Every day | ORAL | Status: DC
  Administered 2017-03-10: 81 mg via ORAL
  Filled 2017-03-09: qty 1

## 2017-03-09 MED ORDER — HEPARIN BOLUS VIA INFUSION
4000.0000 [IU] | Freq: Once | INTRAVENOUS | Status: AC
  Administered 2017-03-09: 4000 [IU] via INTRAVENOUS
  Filled 2017-03-09: qty 4000

## 2017-03-09 MED ORDER — CALCIUM CARBONATE ANTACID 500 MG PO CHEW
1.0000 | CHEWABLE_TABLET | Freq: Every day | ORAL | Status: DC | PRN
  Filled 2017-03-09: qty 1

## 2017-03-09 MED ORDER — SIMVASTATIN 20 MG PO TABS
40.0000 mg | ORAL_TABLET | Freq: Every day | ORAL | Status: DC
  Administered 2017-03-09: 19:00:00 40 mg via ORAL
  Filled 2017-03-09: qty 2

## 2017-03-09 MED ORDER — ACETAMINOPHEN 325 MG PO TABS
650.0000 mg | ORAL_TABLET | ORAL | Status: DC | PRN
  Administered 2017-03-09 – 2017-03-10 (×2): 650 mg via ORAL
  Filled 2017-03-09 (×2): qty 2

## 2017-03-09 MED ORDER — VERAPAMIL HCL 2.5 MG/ML IV SOLN
INTRAVENOUS | Status: AC
  Filled 2017-03-09: qty 2

## 2017-03-09 MED ORDER — HEPARIN SODIUM (PORCINE) 1000 UNIT/ML IJ SOLN
INTRAMUSCULAR | Status: AC
  Filled 2017-03-09: qty 1

## 2017-03-09 MED ORDER — ASPIRIN EC 325 MG PO TBEC: 325.0000 mg | DELAYED_RELEASE_TABLET | Freq: Every day | ORAL | Status: DC

## 2017-03-09 MED ORDER — ALPRAZOLAM 0.25 MG PO TABS: 0.2500 mg | ORAL_TABLET | Freq: Two times a day (BID) | ORAL | Status: DC | PRN

## 2017-03-09 SURGICAL SUPPLY — 13 items
CATH INFINITI 5FR AL1 (CATHETERS) ×3 IMPLANT
CATH INFINITI MULTIPACK ST 5F (CATHETERS) ×3 IMPLANT
COVER PRB 48X5XTLSCP FOLD TPE (BAG) ×2 IMPLANT
COVER PROBE 5X48 (BAG) ×1
DEVICE RAD COMP TR BAND LRG (VASCULAR PRODUCTS) ×3 IMPLANT
GLIDESHEATH SLEND A-KIT 6F 22G (SHEATH) ×3 IMPLANT
GUIDEWIRE INQWIRE 1.5J.035X260 (WIRE) ×2 IMPLANT
INQWIRE 1.5J .035X260CM (WIRE) ×3
KIT HEART LEFT (KITS) ×3 IMPLANT
PACK CARDIAC CATHETERIZATION (CUSTOM PROCEDURE TRAY) ×3 IMPLANT
TRANSDUCER W/STOPCOCK (MISCELLANEOUS) ×3 IMPLANT
TUBING CIL FLEX 10 FLL-RA (TUBING) ×3 IMPLANT
WIRE HI TORQ VERSACORE-J 145CM (WIRE) ×3 IMPLANT

## 2017-03-09 NOTE — Progress Notes (Signed)
ANTICOAGULATION CONSULT NOTE - Initial Consult  Pharmacy Consult for Heparin Indication: chest pain/ACS  No Known Allergies  Patient Measurements: Height: 5\' 10"  (177.8 cm) Weight: 170 lb (77.1 kg) IBW/kg (Calculated) : 73 Heparin Dosing Weight: 77 kg  Vital Signs: Temp: 98.3 F (36.8 C) (02/11 0628) Temp Source: Oral (02/11 0628) BP: 133/90 (02/11 0900) Pulse Rate: 63 (02/11 0900)  Labs: Recent Labs    03/09/17 0641  HGB 15.2  HCT 45.6  PLT 243  CREATININE 0.99    Estimated Creatinine Clearance: 83 mL/min (by C-G formula based on SCr of 0.99 mg/dL).   Medical History: Past Medical History:  Diagnosis Date  . Arthritis   . CAD in native artery 12/2016  . Chest pain 11/19/2016  . GERD (gastroesophageal reflux disease)   . High cholesterol   . Hypertension   . NSTEMI (non-ST elevated myocardial infarction) (HCC) 12/2016  . 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 Pharmacy consulted to start IV heparin. H/H and Plt wnl. Pt is not on any anticoagulation prior to admission.   Goal of Therapy:  Heparin level 0.3-0.7 units/ml Monitor platelets by anticoagulation protocol: Yes   Plan:  -Heparin 4000 units once, then heparin 1000 units/hr -F/u 6 hr HL  -Monitor daily HL, CBC and s/s of bleeding   Vinnie LevelBenjamin Vanda Waskey, PharmD., BCPS Clinical Pharmacist Pager [phone]

## 2017-03-09 NOTE — ED Notes (Signed)
Delay in lab draw,   Pt enroute to xray. 

## 2017-03-09 NOTE — H&P (Addendum)
Cardiology Admission History and Physical:   Patient ID: Logan Wells; MRN: [id number]; DOB: Jan 22, 1956   Admission date: 03/09/2017  Primary Care Provider: Kathyrn Lass, MD Primary Cardiologist: Fransico Him, MD  Primary Electrophysiologist:  NA  Chief Complaint:  Chest pain  Patient Profile:   Logan Wells is a 63 y.o. male with a history of NSTEMI, 3 vessel CAD, then CABG X 4 01/06/2017, had been doing well post CABG until chest pain began yesterday.    History of Present Illness:   Logan Wells has a history of NSTEMI, 3 vessel CAD, then CABG X 4 01/06/2017,  Initially found on cardiac CTA done for chest pain.  Then presented with NSTEMI and cath revealed severe disease, CABG with LIMA-LAD, SVG-Diag, SVG-OM and SVG-PDB of distal Cx.   Did well post op, discharged on ASA, BB, STATIN.-though he does not tolerate lipitor due to myalgias. Has done well on post op visits.    Today pt presents to ER with central to Rt sided CP since 01/05/18.  Pressure to sharp pain.  Pain began before bed then when he woke at 4 AM he had bil shoulder pain and pressure mid sternal and sharp pain on either side.  + diaphoretic when he woke, no nausea or SOB.  Has been given 1 NTG sl.  Which may or may not have helped.   His BP has mostly been controlled.  He is walking 3 miles per day.  He did go to the mountains on Sat and was active.   No rapid or irregular HR.  + feels heart beating.    EKG  I personally reviewed, SR and no acute changes from 01/07/18 + inverted T wave in V2  Troponin poc 0.00 Na 140, K+3.8 glucose 113, Cr. 0.99 H/H 15.2/45.6 WBC 6.3  2V CXR no evidence of active disease  Currently mild discomfort in chest.    Past Medical History:  Diagnosis Date  . Arthritis   . CAD in native artery 12/2016  . Chest pain 11/19/2016  . GERD (gastroesophageal reflux disease)   . High cholesterol   . Hypertension   . NSTEMI (non-ST elevated myocardial infarction) ([place]) 12/2016  . S/P CABG x 4  01/06/2017    Past Surgical History:  Procedure Laterality Date  . CORONARY ARTERY BYPASS GRAFT N/A 01/06/2017   Procedure: CORONARY ARTERY BYPASS GRAFTING (CABG) x4 , USING LEFT INTERNAL MAMMARY ARTERY AND RIGHT GREATER SAPHENOUS VEIN HARVESTED ENDOSCOPICALLY;  Surgeon: Ivin Poot, MD;  Location: [place];  Service: Open Heart Surgery;  Laterality: N/A;  . LEFT HEART CATH AND CORONARY ANGIOGRAPHY N/A 01/05/2017   Procedure: LEFT HEART CATH AND CORONARY ANGIOGRAPHY;  Surgeon: Martinique, Peter M, MD;  Location: [place] [facility];  Service: Cardiovascular;  Laterality: N/A;  . SHOULDER SURGERY     Left  . TEE WITHOUT CARDIOVERSION N/A 01/06/2017   Procedure: TRANSESOPHAGEAL ECHOCARDIOGRAM (TEE);  Surgeon: Prescott Gum, Collier Salina, MD;  Location: [place];  Service: Open Heart Surgery;  Laterality: N/A;     Medications Prior to Admission: Prior to Admission medications   Medication Sig Start Date End Date Taking? Authorizing Provider  aspirin EC 325 MG EC tablet Take 1 tablet (325 mg total) by mouth daily. 01/11/17  Yes Lars Pinks M, PA-C  calcium carbonate (TUMS - DOSED IN MG ELEMENTAL CALCIUM) 500 MG chewable tablet Chew 1 tablet by mouth daily as needed for indigestion or heartburn.   Yes [provider]  esomeprazole (NEXIUM) 10 MG packet Take  10 mg by mouth daily as needed (for acid reflux).    Yes [provider]  metoprolol tartrate (LOPRESSOR) 25 MG tablet Take 1 tablet (25 mg total) by mouth 2 (two) times daily. 01/28/17  Yes Rosita Fire, Brittainy M, PA-C  simvastatin (ZOCOR) 40 MG tablet Take 40 mg by mouth daily. 02/20/17  Yes [provider]  atorvastatin (LIPITOR) 80 MG tablet Take 1 tablet (80 mg total) by mouth daily at 6 PM. Patient not taking: Reported on 03/03/2017 01/28/17   Lyda Jester M, PA-C  mupirocin ointment (BACTROBAN) 2 % Place 1 application into the nose 2 (two) times daily. For one day then stop. Patient not taking: Reported on 03/03/2017  01/10/17   Nani Skillern, PA-C  oxyCODONE (OXY IR/ROXICODONE) 5 MG immediate release tablet Take 5 mg by mouth every 4-6 hours PRN severe pain. Patient not taking: Reported on 03/03/2017 01/10/17   Nani Skillern, PA-C     Allergies:   No Known Allergies  Social History:   Social History   Socioeconomic History  . Marital status: Married    Spouse name: Not on file  . Number of children: Not on file  . Years of education: Not on file  . Highest education level: Not on file  Social Needs  . Financial resource strain: Not on file  . Food insecurity - worry: Not on file  . Food insecurity - inability: Not on file  . Transportation needs - medical: Not on file  . Transportation needs - non-medical: Not on file  Occupational History  . Not on file  Tobacco Use  . Smoking status: Never Smoker  . Smokeless tobacco: Never Used  Substance and Sexual Activity  . Alcohol use: Yes    Alcohol/week: 1.2 oz    Types: 2 Glasses of wine per week    Comment: daily  . Drug use: No  . Sexual activity: Not on file  Other Topics Concern  . Not on file  Social History Narrative  . Not on file    Family History:   The patient's family history includes CAD in his mother; Heart attack (age of onset: 27) in his mother; Heart disease in his mother; Multiple myeloma in his father.    ROS:  Please see the history of present illness.  General:no colds or fevers, no weight changes Skin:no rashes or ulcers HEENT:no blurred vision, no congestion CV:see HPI PUL:see HPI GI:no diarrhea constipation or melena, no indigestion GU:no hematuria, no dysuria MS:no joint pain, no claudication Neuro:no syncope, no lightheadedness Endo:no diabetes, no thyroid disease      Physical Exam/Data:   Vitals:   03/09/17 0630 03/09/17 0645 03/09/17 0700 03/09/17 0719  BP:   (!) 140/99 116/85  Pulse: 61 60 63 77  Resp: 16 13 17 15   Temp:      TempSrc:      SpO2: 99% 100% 98% 95%  Weight:        Height:       No intake or output data in the 24 hours ending 03/09/17 0818 Filed Weights   03/09/17 0619  Weight: 170 lb (77.1 kg)   Body mass index is 24.39 kg/m.  General:  Well nourished, well developed, in no acute distress HEENT: normal Lymph: no adenopathy Neck: no JVD, no bruits Endocrine:  No thryomegaly Vascular: No carotid bruits; 2+ pedal pulses Cardiac:  normal S1, S2; RRR; no murmur, gallup, rub or click   Lungs:  clear to auscultation bilaterally, no  wheezing, rare rhonchi no rales  Abd: soft, nontender, no hepatomegaly  Ext: no lower ext edema Musculoskeletal:  No deformities, BUE and BLE strength normal and equal Skin: warm and dry  Neuro:  Alert and oriented X 3 MAE follows commands, no focal abnormalities noted Psych:  Normal affect    Relevant CV Studies: 01/05/17 cath Procedures   LEFT HEART CATH AND CORONARY ANGIOGRAPHY  Conclusion     Prox RCA to Mid RCA lesion is 90% stenosed.  Mid RCA to Dist RCA lesion is 90% stenosed.  Ost LAD lesion is 90% stenosed.  Prox LAD lesion is 95% stenosed.  Prox LAD to Mid LAD lesion is 50% stenosed.  Ost 1st Mrg lesion is 60% stenosed.  Dist Cx lesion is 90% stenosed.  Ost LPDA to LPDA lesion is 80% stenosed.  The left ventricular systolic function is normal.  LV end diastolic pressure is normal.  The left ventricular ejection fraction is 55-65% by visual estimate.   1. 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   - 90% ostial LAD   - 95% proximal LAD at diagonal bifurcation   - 90% distal LCx with long segmental disease extending into PDA   - 90% nondominant RCA 2. Good LV function 3. Normal LVEDP  Plan: given complexity of disease I would recommend surgical consultation for CABG.     Diagnostic Diagram            TEE 01/06/17 with surgery  01/06/17 CORONARY ARTERY BYPASS GRAFTING (CABG) x4 , USING LEFT INTERNAL MAMMARY ARTERY AND RIGHT GREATER SAPHENOUS VEIN HARVESTED  ENDOSCOPICALLY (N/A) TRANSESOPHAGEAL ECHOCARDIOGRAM (TEE) (N/A) LIMA-LAD SVG-OM SVG-PDA SVG-DIAG   Laboratory Data:  Chemistry Recent Labs  Lab 03/09/17 0641  NA 140  K 3.8  CL 103  CO2 23  GLUCOSE 113*  BUN 8  CREATININE 0.99  CALCIUM 9.5  GFRNONAA >60  GFRAA >60  ANIONGAP 14    No results for input(s): PROT, ALBUMIN, AST, ALT, ALKPHOS, BILITOT in the last 168 hours. Hematology Recent Labs  Lab 03/09/17 0641  WBC 6.3  RBC 4.85  HGB 15.2  HCT 45.6  MCV 94.0  MCH 31.3  MCHC 33.3  RDW 13.7  PLT 243   Cardiac EnzymesNo results for input(s): TROPONINI in the last 168 hours.  Recent Labs  Lab 03/09/17 0653  TROPIPOC 0.00    BNPNo results for input(s): BNP, PROBNP in the last 168 hours.  DDimer No results for input(s): DDIMER in the last 168 hours.  Radiology/Studies:  Dg Chest 2 View  Result Date: 03/09/2017 CLINICAL DATA:  Chest pain EXAM: CHEST  2 VIEW COMPARISON:  03/03/2017 FINDINGS: Normal heart size and mediastinal contours. Status post CABG. There is no edema, consolidation, effusion, or pneumothorax. No acute finding. Left glenohumeral arthroplasty. EKG leads create artifact across the chest. IMPRESSION: No evidence of active disease. Electronically Signed   By: Monte Fantasia M.D.   On: 03/09/2017 06:47    Assessment and Plan:   1. Chest pain - no acute EKG changes.  SR, troponin poc is normal but only one back.  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  Dr. Gwenlyn Found to see. May be beneficial to cath to eval. Pt and wife agreeable.  Will add IV heparin and NTG paste.  Also protonix.  2.        CAD with CABG X 4 01/06/17 post NSTEMI and severe 3 vessel disease  3.        HLD,  pt's LDL is elevated, did not tolerate lipitor due to myalgia now on Zocor 40 needs recheck of statin.  4.        GERD hx of , nexium prn will make daily.  5.        HTN BP elevated initially improved after NTG sl   Severity of Illness: The  appropriate patient status for this patient is INPATIENT. Inpatient status is judged to be reasonable and necessary in order to provide the required intensity of service to ensure the patient's safety. The patient's presenting symptoms, physical exam findings, and initial radiographic and laboratory data in the context of their chronic comorbidities is felt to place them at high risk for further clinical deterioration. Furthermore, it is not anticipated that the patient will be medically stable for discharge from the hospital within 2 midnights of admission. The following factors support the patient status of inpatient.   " The patient's presenting symptoms include chest pain, diaphoresis . " The worrisome physical exam findings include continued chest pain. " The initial radiographic and laboratory data are worrisome because of normal. " The chronic co-morbidities include CABG 2 months ago.   * I certify that at the point of admission it is my clinical judgment that the patient will require inpatient hospital care spanning beyond 2 midnights from the point of admission due to high intensity of service, high risk for further deterioration and high frequency of surveillance required.*    For questions or updates, please contact [facility] Please consult [email or web address] for contact info under Cardiology/STEMI.    Signed, Cecilie Kicks, NP  03/09/2017 8:18 AM   Agree with note written by Cecilie Kicks RNP  Logan Wells is a patient of Dr. Thompson Caul with recent coronary artery bypass grafting 2 months ago. He has a history of hypertension and hyperlipidemia. He is very active and was recently in the mountains this past weekend developing chest pain rating to both shoulders which she woke up with at 4:00 this morning. This appears somewhat better today in the ER. His EKG shows no acute changes. His point-of-care markers so far negative and as his exam is benign. Given his new onset symptoms despite the  close proximity to his recent bypass surgery I think we should proceed with diagnostic coronary angiography to rule out early graft failure.\   Quay Burow 03/09/2017 10:43 AM

## 2017-03-09 NOTE — ED Provider Notes (Signed)
[facility]ATH RECOVERY Provider Note   CSN: [id number] Arrival date & time: 03/09/17  1497     History   Chief Complaint Chief Complaint  Patient presents with  . Chest Pain    HPI Logan Wells is a 62 y.o. male.  HPI Patient presents with chest pain.  Began yesterday.  2 months ago had a four-vessel CABG.  Since then has been doing well.  Still walking at least 3 miles a day.  States that on Saturday he walked over 3 miles and then went to a grandsons basketball game.  Yesterday, Sunday he began to have episodes of chest pressure.  There is a constant dull pain but there is a stronger pain that comes and goes.  States he mostly just sat around.  States he did not do anything with exertion but feels as if his exercise would be limited.  No swelling in his legs.  No cough.  No fevers or chills.  Did have one episode of diaphoresis.  No nausea or vomiting.  No cough.  No swelling in his legs.  No pain with movement or pressing on his chest. Past Medical History:  Diagnosis Date  . Arthritis   . CAD in native artery 12/2016  . Chest pain 11/19/2016  . GERD (gastroesophageal reflux disease)   . High cholesterol   . Hypertension   . NSTEMI (non-ST elevated myocardial infarction) ([place]) 12/2016  . 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 S/P CABG x 4 03/03/2017  . Non-ST elevation (NSTEMI) myocardial infarction ([place])   . CAD (coronary artery disease) 01/04/2017  . Essential hypertension 01/04/2017  . Dyslipidemia 01/04/2017  . Family history of coronary artery disease in mother 01/04/2017  . Chest pain   . Unstable angina ([place]) 11/19/2016  . GERD (gastroesophageal reflux disease)     Past Surgical History:  Procedure Laterality Date  . CORONARY ARTERY BYPASS GRAFT N/A 01/06/2017   Procedure: CORONARY ARTERY BYPASS GRAFTING (CABG) x4 , USING LEFT INTERNAL MAMMARY ARTERY AND RIGHT GREATER SAPHENOUS VEIN HARVESTED  ENDOSCOPICALLY;  Surgeon: Ivin Poot, MD;  Location: [facility];  Service: Open Heart Surgery;  Laterality: N/A;  . LEFT HEART CATH AND CORONARY ANGIOGRAPHY N/A 01/05/2017   Procedure: LEFT HEART CATH AND CORONARY ANGIOGRAPHY;  Surgeon: Martinique, Peter M, MD;  Location: [facility];  Service: Cardiovascular;  Laterality: N/A;  . SHOULDER SURGERY     Left  . TEE WITHOUT CARDIOVERSION N/A 01/06/2017   Procedure: TRANSESOPHAGEAL ECHOCARDIOGRAM (TEE);  Surgeon: Prescott Gum, Collier Salina, MD;  Location: [facility];  Service: Open Heart Surgery;  Laterality: N/A;       Home Medications    Prior to Admission medications   Medication Sig Start Date End Date Taking? Authorizing Provider  aspirin EC 325 MG EC tablet Take 1 tablet (325 mg total) by mouth daily. 01/11/17  Yes Lars Pinks M, PA-C  calcium carbonate (TUMS - DOSED IN MG ELEMENTAL CALCIUM) 500 MG chewable tablet Chew 1 tablet by mouth daily as needed for indigestion or heartburn.   Yes [provider]  esomeprazole (NEXIUM) 10 MG packet Take 10 mg by mouth daily as needed (for acid reflux).    Yes [provider]  metoprolol tartrate (LOPRESSOR) 25 MG tablet Take 1 tablet (25 mg total) by mouth 2 (two) times daily. 01/28/17  Yes Simmons, Brittainy M, PA-C  simvastatin (ZOCOR) 40 MG tablet Take  40 mg by mouth daily. 02/20/17  Yes [provider]  atorvastatin (LIPITOR) 80 MG tablet Take 1 tablet (80 mg total) by mouth daily at 6 PM. Patient not taking: Reported on 03/03/2017 01/28/17   Lyda Jester M, PA-C  mupirocin ointment (BACTROBAN) 2 % Place 1 application into the nose 2 (two) times daily. For one day then stop. Patient not taking: Reported on 03/03/2017 01/10/17   Nani Skillern, PA-C  oxyCODONE (OXY IR/ROXICODONE) 5 MG immediate release tablet Take 5 mg by mouth every 4-6 hours PRN severe pain. Patient not taking: Reported on 03/03/2017 01/10/17   Nani Skillern, PA-C    Family History Family  History  Problem Relation Age of Onset  . Heart disease Mother   . Heart attack Mother 40  . CAD Mother   . Multiple myeloma Father     Social History Social History   Tobacco Use  . Smoking status: Never Smoker  . Smokeless tobacco: Never Used  Substance Use Topics  . Alcohol use: Yes    Alcohol/week: 1.2 oz    Types: 2 Glasses of wine per week    Comment: daily  . Drug use: No     Allergies   Lipitor [atorvastatin calcium]   Review of Systems Review of Systems  Constitutional: Negative for appetite change and fever.  HENT: Negative for congestion.   Cardiovascular: Positive for chest pain.  Gastrointestinal: Negative for abdominal pain.  Genitourinary: Negative for flank pain.  Musculoskeletal: Negative for back pain.  Neurological: Negative for seizures.  Hematological: Negative for adenopathy.  Psychiatric/Behavioral: Negative for confusion.     Physical Exam Updated Vital Signs BP 124/87   Pulse 72   Temp 98.3 F (36.8 C) (Oral)   Resp 15   Ht 5' 10"  (1.778 m)   Wt 77.1 kg (170 lb)   SpO2 99%   BMI 24.39 kg/m   Physical Exam  Constitutional: He appears well-developed.  HENT:  Head: Normocephalic.  Neck: Neck supple.  Cardiovascular: Normal rate.  No murmur heard. Pulmonary/Chest: He has no wheezes. He has no rhonchi. He has no rales.  No chest tenderness.  Scar from previous median sternotomy.  Abdominal: There is no tenderness.  Musculoskeletal:       Right lower leg: He exhibits no edema.       Left lower leg: He exhibits no edema.  Neurological: He is alert.  Skin: Skin is warm. Capillary refill takes less than 2 seconds.     ED Treatments / Results  Labs (all labs ordered are listed, but only abnormal results are displayed) Labs Reviewed  BASIC METABOLIC PANEL - Abnormal; Notable for the following components:      Result Value   Glucose, Bld 113 (*)    All other components within normal limits  D-DIMER, QUANTITATIVE (NOT AT  [facility]) - Abnormal; Notable for the following components:   D-Dimer, Quant 0.91 (*)    All other components within normal limits  CBC  TROPONIN I  MAGNESIUM  TSH  PROTIME-INR  TROPONIN I  TROPONIN I  CBC  CREATININE, SERUM  I-STAT TROPONIN, ED    EKG  EKG Interpretation  Date/Time:  Monday March 09 2017 06:24:16 EST Ventricular Rate:  68 PR Interval:    QRS Duration: 98 QT Interval:  443 QTC Calculation: 472 R Axis:   31 Text Interpretation:  Sinus rhythm Confirmed by Randal Buba, April ([id number]) on 03/09/2017 6:29:00 AM Also confirmed by Randal Buba, April ([id number]), editor Oswaldo Milian, Beverly ([id number])  on 03/09/2017 6:39:58 AM Also confirmed by Davonna Belling [phone])  on 03/09/2017 6:59:04 AM       Radiology Dg Chest 2 View  Result Date: 03/09/2017 CLINICAL DATA:  Chest pain EXAM: CHEST  2 VIEW COMPARISON:  03/03/2017 FINDINGS: Normal heart size and mediastinal contours. Status post CABG. There is no edema, consolidation, effusion, or pneumothorax. No acute finding. Left glenohumeral arthroplasty. EKG leads create artifact across the chest. IMPRESSION: No evidence of active disease. Electronically Signed   By: Monte Fantasia M.D.   On: 03/09/2017 06:47    Procedures Procedures (including critical care time)  Medications Ordered in ED Medications  nitroGLYCERIN (NITROGLYN) 2 % ointment 1 inch ( Topical MAR Unhold 03/09/17 1613)  calcium carbonate (TUMS - dosed in mg elemental calcium) chewable tablet 200 mg of elemental calcium (not administered)  metoprolol tartrate (LOPRESSOR) tablet 25 mg ( Oral MAR Unhold 03/09/17 1613)  simvastatin (ZOCOR) tablet 40 mg (not administered)  nitroGLYCERIN (NITROSTAT) SL tablet 0.4 mg (not administered)  0.9 %  sodium chloride infusion (not administered)  ALPRAZolam (XANAX) tablet 0.25 mg (not administered)  acetaminophen (TYLENOL) tablet 650 mg (not administered)  ondansetron (ZOFRAN) injection 4 mg (not administered)  heparin injection 5,000  Units (not administered)  0.9% sodium chloride infusion (not administered)  sodium chloride flush (NS) 0.9 % injection 3 mL (not administered)  sodium chloride flush (NS) 0.9 % injection 3 mL (not administered)  0.9 %  sodium chloride infusion (not administered)  aspirin chewable tablet 81 mg (not administered)  nitroGLYCERIN (NITROSTAT) SL tablet 0.4 mg (0.4 mg Sublingual Given 03/09/17 0711)  aspirin chewable tablet 324 mg (324 mg Oral Given 03/09/17 1014)    Or  aspirin suppository 300 mg ( Rectal See Alternative 03/09/17 1014)  heparin bolus via infusion 4,000 Units (4,000 Units Intravenous Bolus from Bag 03/09/17 1015)  heparin infusion 2 units/mL in 0.9 % sodium chloride (1,000 mLs Other New Bag/Given 03/09/17 1401)     Initial Impression / Assessment and Plan / ED Course  I have reviewed the triage vital signs and the nursing notes.  Pertinent labs & imaging results that were available during my care of the patient were reviewed by me and considered in my medical decision making (see chart for details).     Patient with chest pain.  Previous CABG 2 months ago.  Pain-free now.  Is initial troponin negative but pain came on with exertion.  Feels he would not be able to exert himself.  Seen by cardiology admitted and will be taken to the Cath Lab.  Final Clinical Impressions(s) / ED Diagnoses   Final diagnoses:  Chest pain, unspecified type    ED Discharge Orders    None       Davonna Belling, MD 03/09/17 1626

## 2017-03-09 NOTE — ED Notes (Signed)
Pt states his cardiologist is Dr. SwazilandJordan not Dr. Mayford Knifeurner

## 2017-03-09 NOTE — Interval H&P Note (Signed)
History and Physical Interval Note: Cath Lab Visit (complete for each Cath Lab visit)  Clinical Evaluation Leading to the Procedure:   ACS: Yes.    Non-ACS:    Anginal Classification: CCS III  Anti-ischemic medical therapy: Minimal Therapy (1 class of medications)  Non-Invasive Test Results: No non-invasive testing performed  Prior CABG: Previous CABG      Cath Lab Visit (complete for each Cath Lab visit)  Clinical Evaluation Leading to the Procedure:   ACS: Yes.    Non-ACS:    Anginal Classification: CCS IV  Anti-ischemic medical therapy: Minimal Therapy (1 class of medications)  Non-Invasive Test Results: No non-invasive testing performed  Prior CABG: Previous CABG       03/09/2017 1:52 PM  Logan Wells  has presented today for surgery, with the diagnosis of cp  The various methods of treatment have been discussed with the patient and family. After consideration of risks, benefits and other options for treatment, the patient has consented to  Procedure(s): LEFT HEART CATH AND CORS/GRAFTS ANGIOGRAPHY (N/A) as a surgical intervention .  The patient's history has been reviewed, patient examined, no change in status, stable for surgery.  I have reviewed the patient's chart and labs.  Questions were answered to the patient's satisfaction.     Lyn RecordsHenry W Jaimee Corum III

## 2017-03-09 NOTE — Progress Notes (Signed)
TR BAND REMOVAL  LOCATION:    left radial  DEFLATED PER PROTOCOL:    Yes.    TIME BAND OFF / DRESSING APPLIED:    1945   SITE UPON ARRIVAL:    Level 0  SITE AFTER BAND REMOVAL:    Level 0  CIRCULATION SENSATION AND MOVEMENT:    Within Normal Limits   Yes.    COMMENTS:   TOLERATED PROCEDURE WELL

## 2017-03-09 NOTE — ED Triage Notes (Signed)
Pt reports central/R sided CP present since yesterday. Pressure/sharp, 6/10. Denies N/V,SOB,dizziness,etc. Pt had CABG 01/06/17

## 2017-03-09 NOTE — ED Notes (Signed)
Nurse will start IV and draw labs. 

## 2017-03-10 ENCOUNTER — Ambulatory Visit (HOSPITAL_COMMUNITY): Payer: Managed Care, Other (non HMO)

## 2017-03-10 ENCOUNTER — Encounter (HOSPITAL_COMMUNITY): Payer: Self-pay | Admitting: Interventional Cardiology

## 2017-03-10 DIAGNOSIS — I214 Non-ST elevation (NSTEMI) myocardial infarction: Secondary | ICD-10-CM | POA: Diagnosis not present

## 2017-03-10 DIAGNOSIS — I200 Unstable angina: Secondary | ICD-10-CM | POA: Diagnosis not present

## 2017-03-10 DIAGNOSIS — Z951 Presence of aortocoronary bypass graft: Secondary | ICD-10-CM | POA: Diagnosis not present

## 2017-03-10 LAB — CBC
HCT: 41.2 % (ref 39.0–52.0)
Hemoglobin: 13.7 g/dL (ref 13.0–17.0)
MCH: 31.4 pg (ref 26.0–34.0)
MCHC: 33.3 g/dL (ref 30.0–36.0)
MCV: 94.5 fL (ref 78.0–100.0)
PLATELETS: 226 10*3/uL (ref 150–400)
RBC: 4.36 MIL/uL (ref 4.22–5.81)
RDW: 13.8 % (ref 11.5–15.5)
WBC: 5.8 10*3/uL (ref 4.0–10.5)

## 2017-03-10 LAB — BASIC METABOLIC PANEL
Anion gap: 12 (ref 5–15)
BUN: 10 mg/dL (ref 6–20)
CALCIUM: 9 mg/dL (ref 8.9–10.3)
CHLORIDE: 106 mmol/L (ref 101–111)
CO2: 23 mmol/L (ref 22–32)
CREATININE: 0.83 mg/dL (ref 0.61–1.24)
GFR calc non Af Amer: >60 mL/min (ref >60)
Glucose, Bld: 102 mg/dL — ABNORMAL HIGH (ref 65–99)
Potassium: 3.8 mmol/L (ref 3.5–5.1)
SODIUM: 141 mmol/L (ref 135–145)

## 2017-03-10 MED ORDER — NITROGLYCERIN 0.4 MG SL SUBL: 0.4000 mg | SUBLINGUAL_TABLET | SUBLINGUAL | 1 refills | Status: DC | PRN

## 2017-03-10 MED ORDER — ATORVASTATIN CALCIUM 40 MG PO TABS: 40.0000 mg | ORAL_TABLET | Freq: Every day | ORAL | Status: DC

## 2017-03-10 MED ORDER — ATORVASTATIN CALCIUM 80 MG PO TABS: 40.0000 mg | ORAL_TABLET | Freq: Every day | ORAL | 0 refills | Status: DC

## 2017-03-10 MED ORDER — ISOSORBIDE MONONITRATE ER 30 MG PO TB24
30.0000 mg | ORAL_TABLET | Freq: Every day | ORAL | Status: DC
  Administered 2017-03-10: 30 mg via ORAL
  Filled 2017-03-10: qty 1

## 2017-03-10 MED ORDER — ASPIRIN 81 MG PO CHEW: 81.0000 mg | CHEWABLE_TABLET | Freq: Every day | ORAL | Status: AC

## 2017-03-10 MED ORDER — CLOPIDOGREL BISULFATE 75 MG PO TABS: 75.0000 mg | ORAL_TABLET | Freq: Every day | ORAL | 2 refills | Status: DC

## 2017-03-10 MED ORDER — ISOSORBIDE MONONITRATE ER 30 MG PO TB24: 30.0000 mg | ORAL_TABLET | Freq: Every day | ORAL | 1 refills | Status: DC

## 2017-03-10 MED ORDER — PANTOPRAZOLE SODIUM 40 MG PO TBEC: 40.0000 mg | DELAYED_RELEASE_TABLET | Freq: Every day | ORAL | 1 refills | Status: DC

## 2017-03-10 MED FILL — Heparin Sodium (Porcine) 2 Unit/ML in Sodium Chloride 0.9%: INTRAMUSCULAR | Qty: 1000 | Status: AC

## 2017-03-10 MED FILL — Lidocaine HCl Local Inj 1%: INTRAMUSCULAR | Qty: 20 | Status: AC

## 2017-03-10 NOTE — Discharge Summary (Signed)
Discharge Summary    Patient ID: Logan Wells,  MRN: [id number], DOB/AGE: 1955/11/18 62 y.o.  Admit date: 03/09/2017 Discharge date: 03/10/2017  Primary Care Provider: Sigmund Wells Primary Cardiologist: Dr. Swaziland  Discharge Diagnoses    Principal Problem:   Non-ST elevation (NSTEMI) myocardial infarction [facility]) Active Problems:   Unstable angina (HCC)   CAD (coronary artery disease)   Chest pain   S/P CABG x 4   Allergies Allergies  Allergen Reactions  . Lipitor [Atorvastatin Calcium]     Myalgia     Diagnostic Studies/Procedures    Cath: 03/09/17  Conclusion    Bypass graft failure with occlusion of the SVG to the PDA and SVG to the first obtuse marginal.   Patent SVG to diagonal.  Patent although atretic appearing LIMA to the mid LAD.   Normal left main.  Total occlusion of the proximal to mid LAD.  90% ostial LAD obstruction.  50-60% obstruction in the ostial to proximal second obtuse marginal which is the dominant obtuse marginal previously bypassed.  Severe diffusely diseased distal circumflex into the left PDA between 80 and 95% throughout the involved region.  Nondominant right coronary with proximal 95% obstruction and mid 90% obstruction after the first acute marginal branch  Normal left ventricular function.  Normal left ventricular end-diastolic pressure.  RECOMMENDATIONS:   Medical therapy.    _____________   History of Present Illness     62 yo male with a history of NSTEMI, 3 vessel CAD, then CABG X 4 01/06/2017, Initially found on cardiac CTA done for chest pain.  Then presented with NSTEMI and cath revealed severe disease, CABG with LIMA-LAD, SVG-Diag, SVG-OM and SVG-PDB of distal Cx.   Did well post op, discharged on ASA, BB, STATIN.-though he did not tolerate high dose lipitor due to myalgias. Has done well on post op visits.    Pt presented to ER with central to Rt sided CP since 01/05/18.  Pressure to sharp pain.  Pain began  before bed then when he woke at 4 AM he had bil shoulder pain and pressure mid sternal and sharp pain on either side.  + diaphoretic when he woke, no nausea or SOB.  Has been given 1 NTG sl. Which may or may not have helped. His BP has mostly been controlled. He was walking 3 miles per day.  [place] and was active. No rapid or irregular HR.  + feels heart beating.    EKG showed SR and no acute changes from 01/07/18 + inverted T wave in V2  Troponin poc 0.00 Na 140, K+3.8 glucose 113, Cr. 0.99 H/H 15.2/45.6 WBC 6.3  2V CXR no evidence of active disease  Given his symptoms he was started on IV heparin and nitro paste, then sent for cardiac cath.   Hospital Course     Underwent cardiac cath noted above with graft failure with occlusion of SVG-PDA and SVG-OM. Patent LIMA-LAD, and SVG-Diag. Normal LVEDP, and EF on LV gram. No intervention to grafts or native vessels. Planned for medical therapy. Reports myalgias on atorvastatin 80mg , and was switched back to simvastatin. He is agreeable to try atorvastatin 40mg  daily at discharge. Added Imdur 30mg  daily, along with DAPT ASA/plavix. Post cath labs were stable. No further chest pain. Worked with cardiac rehab.    General: Well developed, well nourished, male appearing in no acute distress. Head: Normocephalic, atraumatic.  Neck: Supple without bruits, JVD. Lungs:  Resp  regular and unlabored, CTA. Heart: RRR, S1, S2, no S3, S4, or murmur; no rub. Abdomen: Soft, non-tender, non-distended with normoactive bowel sounds. No hepatomegaly. No rebound/guarding. No obvious abdominal masses. Extremities: No clubbing, cyanosis, edema. Distal pedal pulses are 2+ bilaterally. L radial cath site stable without bruising or hematoma Neuro: Alert and oriented X 3. Moves all extremities spontaneously. Psych: Normal affect.  Logan Wells was seen by Dr. Allyson Wells and determined stable for discharge home. Follow up in the  office has been arranged. Medications are listed below.   _____________  Discharge Vitals Blood pressure 133/85, pulse 65, temperature 97.7 F (36.5 C), temperature source Oral, resp. rate 13, height 5\' 10"  (1.778 m), weight 171 lb 15.3 oz (78 kg), SpO2 95 %.  Filed Weights   03/09/17 0619 03/10/17 0505  Weight: 170 lb (77.1 kg) 171 lb 15.3 oz (78 kg)    Labs & Radiologic Studies    CBC Recent Labs    03/09/17 0641 03/10/17 0508  WBC 6.3 5.8  HGB 15.2 13.7  HCT 45.6 41.2  MCV 94.0 94.5  PLT 243 226   Basic Metabolic Panel Recent Labs    40/98/11 0641 03/09/17 0856 03/10/17 0508  NA 140  --  141  K 3.8  --  3.8  CL 103  --  106  CO2 23  --  23  GLUCOSE 113*  --  102*  BUN 8  --  10  CREATININE 0.99  --  0.83  CALCIUM 9.5  --  9.0  MG  --  1.9  --    Liver Function Tests No results for input(s): AST, ALT, ALKPHOS, BILITOT, PROT, ALBUMIN in the last 72 hours. No results for input(s): LIPASE, AMYLASE in the last 72 hours. Cardiac Enzymes Recent Labs    03/09/17 0856 03/09/17 1643 03/09/17 2101  TROPONINI <0.03 <0.03 <0.03   BNP Invalid input(s): POCBNP D-Dimer Recent Labs    03/09/17 0957  DDIMER 0.91*   Hemoglobin A1C No results for input(s): HGBA1C in the last 72 hours. Fasting Lipid Panel No results for input(s): CHOL, HDL, LDLCALC, TRIG, CHOLHDL, LDLDIRECT in the last 72 hours. Thyroid Function Tests Recent Labs    03/09/17 0856  TSH 1.267   _____________  Dg Chest 2 View  Result Date: 03/09/2017 CLINICAL DATA:  Chest pain EXAM: CHEST  2 VIEW COMPARISON:  03/03/2017 FINDINGS: Normal heart size and mediastinal contours. Status post CABG. There is no edema, consolidation, effusion, or pneumothorax. No acute finding. Left glenohumeral arthroplasty. EKG leads create artifact across the chest. IMPRESSION: No evidence of active disease. Electronically Signed   By: Logan Wells M.D.   On: 03/09/2017 06:47   Dg Chest 2 View  Result Date:  03/03/2017 CLINICAL DATA:  Post CABG. EXAM: CHEST  2 VIEW COMPARISON:  01/09/2017 FINDINGS: Changes of CABG. Heart and mediastinal contours are within normal limits. No focal opacities or effusions. No acute bony abnormality. No pneumothorax IMPRESSION: CABG.  No active cardiopulmonary disease. Electronically Signed   By: Charlett Nose M.D.   On: 03/03/2017 12:03   Disposition   Pt is being discharged home today in good condition.  Follow-up Plans & Appointments    Follow-up Information    Azalee Course, [place] Follow up on 03/18/2017.   Specialties:  Cardiology, Radiology Why:  at 3:30pm for your follow up appt.  Contact information: [address] [place] [address] [phone]          Discharge Instructions  Call MD for:  redness, tenderness, or signs of infection (pain, swelling, redness, odor or green/yellow discharge around incision site)   Complete by:  As directed    Diet - low sodium heart healthy   Complete by:  As directed    Discharge instructions   Complete by:  As directed    Radial Site Care Refer to this sheet in the next few weeks. These instructions provide you with information on caring for yourself after your procedure. Your caregiver may also give you more specific instructions. Your treatment has been planned according to current medical practices, but problems sometimes occur. Call your caregiver if you have any problems or questions after your procedure. HOME CARE INSTRUCTIONS You may shower the day after the procedure.Remove the bandage (dressing) and gently wash the site with plain soap and water.Gently pat the site dry.  Do not apply powder or lotion to the site.  Do not submerge the affected site in water for 3 to 5 days.  Inspect the site at least twice daily.  Do not flex or bend the affected arm for 24 hours.  No lifting over 5 pounds (2.3 kg) for 5 days after your procedure.  Do not drive home if you are discharged the same day of the  procedure. Have someone else drive you.  You may drive 24 hours after the procedure unless otherwise instructed by your caregiver.  What to expect: Any bruising will usually fade within 1 to 2 weeks.  Blood that collects in the tissue (hematoma) may be painful to the touch. It should usually decrease in size and tenderness within 1 to 2 weeks.  SEEK IMMEDIATE MEDICAL CARE IF: You have unusual pain at the radial site.  You have redness, warmth, swelling, or pain at the radial site.  You have drainage (other than a small amount of blood on the dressing).  You have chills.  You have a fever or persistent symptoms for more than 72 hours.  You have a fever and your symptoms suddenly get worse.  Your arm becomes pale, cool, tingly, or numb.  You have heavy bleeding from the site. Hold pressure on the site.   We adjusted your Lipitor to 40mg  daily and added Imdur 30mg  daily. Have arranged for close follow up in the office. Please monitor your blood pressures and bring to your follow up appt. Please call if questions.   Increase activity slowly   Complete by:  As directed       Discharge Medications     Medication List    STOP taking these medications   aspirin 325 MG EC tablet Replaced by:  aspirin 81 MG chewable tablet   esomeprazole 10 MG packet Commonly known as:  NEXIUM   mupirocin ointment 2 % Commonly known as:  BACTROBAN   oxyCODONE 5 MG immediate release tablet Commonly known as:  Oxy IR/ROXICODONE   simvastatin 40 MG tablet Commonly known as:  ZOCOR     TAKE these medications   aspirin 81 MG chewable tablet Chew 1 tablet (81 mg total) by mouth daily. Replaces:  aspirin 325 MG EC tablet   atorvastatin 80 MG tablet Commonly known as:  LIPITOR Take 0.5 tablets (40 mg total) by mouth daily at 6 PM. What changed:  how much to take   calcium carbonate 500 MG chewable tablet Commonly known as:  TUMS - dosed in mg elemental calcium Chew 1 tablet by mouth daily as  needed for indigestion or heartburn.  clopidogrel 75 MG tablet Commonly known as:  PLAVIX Take 1 tablet (75 mg total) by mouth daily.   isosorbide mononitrate 30 MG 24 hr tablet Commonly known as:  IMDUR Take 1 tablet (30 mg total) by mouth daily. Start taking on:  03/11/2017   metoprolol tartrate 25 MG tablet Commonly known as:  LOPRESSOR Take 1 tablet (25 mg total) by mouth 2 (two) times daily.   nitroGLYCERIN 0.4 MG SL tablet Commonly known as:  NITROSTAT Place 1 tablet (0.4 mg total) under the tongue every 5 (five) minutes x 3 doses as needed for chest pain.   pantoprazole 40 MG tablet Commonly known as:  PROTONIX Take 1 tablet (40 mg total) by mouth daily.        Aspirin prescribed at discharge?  Yes High Intensity Statin Prescribed? (Lipitor 40-80mg  or Crestor 20-40mg ): Yes Beta Blocker Prescribed? Yes For EF <40%, was ACEI/ARB Prescribed? No: EF ok ADP Receptor Inhibitor Prescribed? (i.e. Plavix etc.-Includes Medically Managed Patients): Yes For EF <40%, Aldosterone Inhibitor Prescribed? No: EF ok Was EF assessed during THIS hospitalization? Yes Was Cardiac Rehab II ordered? (Included Medically managed Patients): Yes   Outstanding Labs/Studies   FLP/LFTs in 6 weeks given adjustment in statin. Consider PCSK9 if unable to tolerate.   Duration of Discharge Encounter   Greater than 30 minutes including physician time.  Signed, Laverda PageLindsay Roberts NP-C 03/10/2017, 9:46 AM   Agree with note by Laverda PageLindsay Roberts NP-C  Logan Wells had cardiac catheterization yesterday revealing 4 grafts occluded including obtained to marginal branch and a left PDA. This was intact without any erythema intact with diagonal branch with which retrograde filled his LAD as well. There was some mild aorto ostial disease of the diagonal vein graft. He is otherwise asymptomatic. He is stable for discharge home today on low-dose aspirin and Plavix. He will follow-up in the office with Dr.  SwazilandJordan.  Runell GessJonathan J. Karma Ansley, M.D., FACP, [facility] General HospitalFACC, Earl LagosFAHA, [facility] HospitalFSCAI [facility] [address] [place], [place]  [id number]  [phone] 03/10/2017 9:49 AM

## 2017-03-10 NOTE — Progress Notes (Signed)
Pt has been walking without CP. Reviewed walking at home, new meds, and NTG. Voiced understanding. Pt was to start CRPII orientation today. Will discuss with them timing of when he can start again. Will probably need MD approval. [phone] Ethelda ChickKristan Taejah Ohalloran CES, ACSM 10:41 AM 03/10/2017

## 2017-03-16 ENCOUNTER — Ambulatory Visit (HOSPITAL_COMMUNITY): Payer: Managed Care, Other (non HMO)

## 2017-03-17 ENCOUNTER — Ambulatory Visit: Payer: Managed Care, Other (non HMO) | Admitting: Physician Assistant

## 2017-03-18 ENCOUNTER — Encounter: Payer: Self-pay | Admitting: Physician Assistant

## 2017-03-18 ENCOUNTER — Ambulatory Visit (HOSPITAL_COMMUNITY): Payer: Managed Care, Other (non HMO)

## 2017-03-18 ENCOUNTER — Ambulatory Visit: Payer: Managed Care, Other (non HMO) | Admitting: Physician Assistant

## 2017-03-18 VITALS — BP 122/84 | HR 66 | Ht 70.0 in | Wt 169.2 lb

## 2017-03-18 DIAGNOSIS — E785 Hyperlipidemia, unspecified: Secondary | ICD-10-CM | POA: Diagnosis not present

## 2017-03-18 DIAGNOSIS — I10 Essential (primary) hypertension: Secondary | ICD-10-CM | POA: Diagnosis not present

## 2017-03-18 DIAGNOSIS — Z79899 Other long term (current) drug therapy: Secondary | ICD-10-CM | POA: Diagnosis not present

## 2017-03-18 DIAGNOSIS — I25709 Atherosclerosis of coronary artery bypass graft(s), unspecified, with unspecified angina pectoris: Secondary | ICD-10-CM | POA: Diagnosis not present

## 2017-03-18 DIAGNOSIS — R079 Chest pain, unspecified: Secondary | ICD-10-CM

## 2017-03-18 MED ORDER — ISOSORBIDE MONONITRATE ER 60 MG PO TB24: 60.0000 mg | ORAL_TABLET | Freq: Every day | ORAL | 3 refills | Status: DC

## 2017-03-18 MED ORDER — AMLODIPINE BESYLATE 2.5 MG PO TABS: 2.5000 mg | ORAL_TABLET | Freq: Every day | ORAL | 3 refills | Status: DC

## 2017-03-18 NOTE — Progress Notes (Signed)
Cardiology Office Note    Date:  03/20/2017   ID:  Logan Wells, DOB [birth year], MRN [id number]  PCP:  Kathyrn Lass, MD  Cardiologist:  Dr. Martinique  Chief Complaint  Patient presents with  . Follow-up    pt states the chest pain has resolved some    History of Present Illness:  Logan Wells is a 62 y.o. male with PMH of HTN, HLD and CAD s/p CABG. patient was initially referred to Dr. Radford Pax by his PCP for evaluation of chest pain.  He had a normal ETT and was set up for coronary CTA in December 2018.  This showed a coronary artery calcium score of 1562 Agaston units and a severe stenosis in the proximal to mid LAD and left PDA.  Possible moderate stenosis in the nondominant RCA.  Patient eventually underwent cardiac catheterization by Dr. Martinique and found to have severe multivessel disease with normal EF and underwent CABG x4 by Dr. Prescott Gum on 01/06/2017 with LIMA to LAD, SVG to diagonal, SVG to OM and SVG to PDB of distal LCx.  Postoperative recovery was fairly uneventful without any arrhythmia.  He was placed on aspirin, beta-blocker, statin therapy.  Patient was last seen by Lyda Jester PA-C in the cardiology office on 01/28/2017 for post hospital follow-up.  He was doing well at the time.  Unfortunately patient went back to the hospital on 03/09/2017 with recurrent chest pain.  His d-dimer was positive at 0.91.  Serial troponin negative.  TSH normal.  He eventually underwent a relook cardiac catheterization on 03/09/2017 which showed bypass graft failure with occlusion of SVG to PDA and SVG to OM1, patent SVG to diagonal, patent by the atretic appearing edema to the mid LAD.  90% mid LAD lesion, 50-60% ostial OM 2 lesion, 80-90% left PDA lesion, nondominant RCA with proximal 95% disease and the mid 90% disease.  Medical therapy was recommended.  He is Lipitor was decreased to 40 mg daily due to myalgia.  30 mg daily of Imdur was added.  Patient presents today along with his wife.  He did  have one episode of chest pain since the recent hospitalization, however this morning he was able to exercise without any issue.  His chest discomfort seems atypical in nature and it does not associated with exertion.  However given his significant native artery disease, I recommend increased Imdur to 60 mg daily.  I also added 2.5 mg daily of amlodipine.  EKG showed minimal J-point elevation in inferior leads, I reviewed the EKG with DOD Dr. Gwenlyn Found, we felt the J-point elevation is not significant enough to be diagnostic.  Patient had a lot of questions today especially regarding the early graft failure.  He seems to be quite active despite the fact that he says he is still not back at his baseline yet.  No lower extremity edema, orthopnea or PND.   Past Medical History:  Diagnosis Date  . Arthritis    "left shoulder" (03/09/2017)  . CAD in native artery 12/2016  . Chest pain 11/19/2016  . GERD (gastroesophageal reflux disease)   . High cholesterol   . Hypertension   . NSTEMI (non-ST elevated myocardial infarction) ([place]) 12/2016; 03/09/2017    Past Surgical History:  Procedure Laterality Date  . CARDIAC CATHETERIZATION  01/05/2017; 03/09/2017  . CORONARY ARTERY BYPASS GRAFT N/A 01/06/2017   Procedure: CORONARY ARTERY BYPASS GRAFTING (CABG) x4 , USING LEFT INTERNAL MAMMARY ARTERY AND RIGHT GREATER SAPHENOUS VEIN HARVESTED ENDOSCOPICALLY;  Surgeon: Lucianne Lei  Donney Rankins, MD;  Location: [facility];  Service: Open Heart Surgery;  Laterality: N/A;  . LEFT HEART CATH AND CORONARY ANGIOGRAPHY N/A 01/05/2017   Procedure: LEFT HEART CATH AND CORONARY ANGIOGRAPHY;  Surgeon: Martinique, Peter M, MD;  Location: [facility];  Service: Cardiovascular;  Laterality: N/A;  . LEFT HEART CATH AND CORS/GRAFTS ANGIOGRAPHY N/A 03/09/2017   Procedure: LEFT HEART CATH AND CORS/GRAFTS ANGIOGRAPHY;  Surgeon: Belva Crome, MD;  Location: [facility];  Service: Cardiovascular;  Laterality: N/A;  . SHOULDER  HEMI-ARTHROPLASTY Left    "partial shoulder replacement"  . TEE WITHOUT CARDIOVERSION N/A 01/06/2017   Procedure: TRANSESOPHAGEAL ECHOCARDIOGRAM (TEE);  Surgeon: Prescott Gum, Collier Salina, MD;  Location: [facility];  Service: Open Heart Surgery;  Laterality: N/A;  . ULTRASOUND GUIDANCE FOR VASCULAR ACCESS  03/09/2017   Procedure: Ultrasound Guidance For Vascular Access;  Surgeon: Belva Crome, MD;  Location: [facility];  Service: Cardiovascular;;    Current Medications: Outpatient Medications Prior to Visit  Medication Sig Dispense Refill  . aspirin 81 MG chewable tablet Chew 1 tablet (81 mg total) by mouth daily.    Marland Kitchen atorvastatin (LIPITOR) 40 MG tablet Take 40 mg by mouth daily.    . calcium carbonate (TUMS - DOSED IN MG ELEMENTAL CALCIUM) 500 MG chewable tablet Chew 1 tablet by mouth daily as needed for indigestion or heartburn.    . clopidogrel (PLAVIX) 75 MG tablet Take 1 tablet (75 mg total) by mouth daily. 30 tablet 2  . metoprolol tartrate (LOPRESSOR) 25 MG tablet Take 1 tablet (25 mg total) by mouth 2 (two) times daily. 180 tablet 0  . nitroGLYCERIN (NITROSTAT) 0.4 MG SL tablet Place 1 tablet (0.4 mg total) under the tongue every 5 (five) minutes x 3 doses as needed for chest pain. 25 tablet 1  . pantoprazole (PROTONIX) 40 MG tablet Take 1 tablet (40 mg total) by mouth daily. 30 tablet 1  . isosorbide mononitrate (IMDUR) 30 MG 24 hr tablet Take 1 tablet (30 mg total) by mouth daily. 30 tablet 1  . atorvastatin (LIPITOR) 80 MG tablet Take 0.5 tablets (40 mg total) by mouth daily at 6 PM. (Patient not taking: Reported on 03/18/2017) 90 tablet 0   No facility-administered medications prior to visit.      Allergies:   Lipitor [atorvastatin calcium]   Social History   Socioeconomic History  . Marital status: Married    Spouse name: None  . Number of children: None  . Years of education: None  . Highest education level: None  Social Needs  . Financial resource strain: None  . Food  insecurity - worry: None  . Food insecurity - inability: None  . Transportation needs - medical: None  . Transportation needs - non-medical: None  Occupational History  . None  Tobacco Use  . Smoking status: Never Smoker  . Smokeless tobacco: Never Used  Substance and Sexual Activity  . Alcohol use: Yes    Alcohol/week: 8.4 oz    Types: 14 Glasses of wine per week    Comment: daily  . Drug use: No  . Sexual activity: None  Other Topics Concern  . None  Social History Narrative  . None     Family History:  The patient's family history includes CAD in his mother; Heart attack (age of onset: 57) in his mother; Heart disease in his mother; Multiple myeloma in his father.   ROS:   Please see the history of present  illness.    ROS All other systems reviewed and are negative.   PHYSICAL EXAM:   VS:  BP 122/84 (BP Location: Left Arm, Patient Position: Sitting, Cuff Size: Normal)   Pulse 66   Ht 5' 10"  (1.778 m)   Wt 169 lb 3.2 oz (76.7 kg)   BMI 24.28 kg/m    GEN: Well nourished, well developed, in no acute distress  HEENT: normal  Neck: no JVD, carotid bruits, or masses Cardiac: RRR; no murmurs, rubs, or gallops,no edema  Respiratory:  clear to auscultation bilaterally, normal work of breathing GI: soft, nontender, nondistended, + BS MS: no deformity or atrophy  Skin: warm and dry, no rash Neuro:  Alert and Oriented x 3, Strength and sensation are intact Psych: euthymic mood, full affect  Wt Readings from Last 3 Encounters:  03/18/17 169 lb 3.2 oz (76.7 kg)  03/10/17 171 lb 15.3 oz (78 kg)  03/03/17 172 lb (78 kg)      Studies/Labs Reviewed:   EKG:  EKG is ordered today.  The ekg ordered today demonstrates normal sinus rhythm, minimal J-point elevation in inferior leads, not diagnostic.  EKG reviewed with DOD Dr. Gwenlyn Found.  Recent Labs: 02/18/2017: ALT 28 03/09/2017: Magnesium 1.9; TSH 1.267 03/10/2017: BUN 10; Creatinine, Ser 0.83; Hemoglobin 13.7; Platelets 226;  Potassium 3.8; Sodium 141   Lipid Panel    Component Value Date/Time   CHOL 206 (H) 02/18/2017 0837   TRIG 156 (H) 02/18/2017 0837   HDL 54 02/18/2017 0837   CHOLHDL 3.8 02/18/2017 0837   CHOLHDL 2.7 01/05/2017 0808   VLDL 16 01/05/2017 0808   LDLCALC 121 (H) 02/18/2017 0837    Additional studies/ records that were reviewed today include:   Cath 03/09/2017 Conclusion    Bypass graft failure with occlusion of the SVG to the PDA and SVG to the first obtuse marginal.   Patent SVG to diagonal.  Patent although atretic appearing LIMA to the mid LAD.   Normal left main.  Total occlusion of the proximal to mid LAD.  90% ostial LAD obstruction.  50-60% obstruction in the ostial to proximal second obtuse marginal which is the dominant obtuse marginal previously bypassed.  Severe diffusely diseased distal circumflex into the left PDA between 80 and 95% throughout the involved region.  Nondominant right coronary with proximal 95% obstruction and mid 90% obstruction after the first acute marginal branch  Normal left ventricular function.  Normal left ventricular end-diastolic pressure.  RECOMMENDATIONS:   Medical therapy.       ASSESSMENT:    1. Chest pain, unspecified type   2. Medication management   3. Dyslipidemia   4. Coronary artery disease involving coronary bypass graft of native heart with angina pectoris ([place])   5. Essential hypertension   6. Hyperlipidemia, unspecified hyperlipidemia type      PLAN:  In order of problems listed above:  1. Chest pain: Solitary episode of chest pain yesterday, recent cardiac catheterization unfortunately shows 2 out of 4 bypass graft has already went down within the first 2 months after surgery.  Although today's EKG showed minimal J-point elevation in inferior leads, I have reviewed it with DOD Dr. Gwenlyn Found who felt this is nondiagnostic.  Patient wished to follow-up with Dr. Martinique instead based on discharge summary and by  patient's own account.  I will increase the Imdur to 60 mg daily and start on 2.5 mg daily of amlodipine for antianginal purposes  2. CAD s/p CABG: Found to have multivessel disease on  coronary CT and underwent bypass surgery, unfortunately recent cardiac catheterization showed 2 out of 4 bypass graft has already went down.  Of the remaining LIMA to LAD and SVG to diagonal, the LIMA graft appears to be somewhat atretic in appearance.  3. Hypertension: Increase Imdur to 60 mg daily, add amlodipine 2.5 mg daily for antianginal purposes  4. Hyperlipidemia: Need fasting lipid panel and LFT in 6-8 weeks    Medication Adjustments/Labs and Tests Ordered: Current medicines are reviewed at length with the patient today.  Concerns regarding medicines are outlined above.  Medication changes, Labs and Tests ordered today are listed in the Patient Instructions below. Patient Instructions  Medication Instructions:  INCREASE isosorbide (Imdur) to 60 mg daily  START amlodipine 2.5 mg daily  Labwork: Please return for FASTING labs in 6 weeks (Lipid, Hepatic)-lab orders provided.  Follow-Up: As schedule 4/2 with Dr. Martinique   If you need a refill on your cardiac medications before your next appointment, please call your pharmacy.      Hilbert Corrigan, [place]  03/20/2017 2:07 AM    [address], [place], [place]  [address] Phone: [phone]; Fax: [phone]

## 2017-03-18 NOTE — Patient Instructions (Signed)
Medication Instructions:  INCREASE isosorbide (Imdur) to 60 mg daily  START amlodipine 2.5 mg daily  Labwork: Please return for FASTING labs in 6 weeks (Lipid, Hepatic)-lab orders provided.  Follow-Up: As schedule 4/2 with Dr. [place]   If you need a refill on your cardiac medications before your next appointment, please call your pharmacy.

## 2017-03-20 ENCOUNTER — Encounter: Payer: Self-pay | Admitting: Physician Assistant

## 2017-03-20 ENCOUNTER — Ambulatory Visit (HOSPITAL_COMMUNITY): Payer: Managed Care, Other (non HMO)

## 2017-03-23 ENCOUNTER — Ambulatory Visit (HOSPITAL_COMMUNITY): Payer: Managed Care, Other (non HMO)

## 2017-03-25 ENCOUNTER — Ambulatory Visit (HOSPITAL_COMMUNITY): Payer: Managed Care, Other (non HMO)

## 2017-03-25 ENCOUNTER — Telehealth (HOSPITAL_COMMUNITY): Payer: Self-pay

## 2017-03-25 NOTE — Telephone Encounter (Signed)
Called patient to reschedule orientation - Rescheduled orientation on 05/05/2017 at 1:30pm. Patient will attend the 2:45pm exc class. Patient stated he still has homework packet.

## 2017-03-27 ENCOUNTER — Ambulatory Visit (HOSPITAL_COMMUNITY): Payer: Managed Care, Other (non HMO)

## 2017-03-30 ENCOUNTER — Telehealth (HOSPITAL_COMMUNITY): Payer: Self-pay

## 2017-03-30 ENCOUNTER — Ambulatory Visit (HOSPITAL_COMMUNITY): Payer: Managed Care, Other (non HMO)

## 2017-03-30 ENCOUNTER — Ambulatory Visit: Payer: Managed Care, Other (non HMO) | Admitting: Cardiology

## 2017-03-30 NOTE — Telephone Encounter (Signed)
Cardiac Rehab Medication Review by a Pharmacist  Does the patient  feel that his/her medications are working for him/her?  yes  Has the patient been experiencing any side effects to the medications prescribed?  no  Does the patient measure his/her own blood pressure or blood glucose at home?  yes   Does the patient have any problems obtaining medications due to transportation or finances?   no  Understanding of regimen: good Understanding of indications: good Potential of compliance: good    Pharmacist comments: Patient need list to verify which medications he was taking. He seems aware of the indications and proper regimen. He reports no issues obtaining medications at this time.    Logan Wells 03/30/2017 5:02 PM

## 2017-03-30 NOTE — Telephone Encounter (Signed)
Called patient due to a cancellation in our orientation schedule to see if patient is available to come in tomorrow 03/31/17 at 1:30pm. Patient is available. Moved appts up. Patient will now attend the 11:15am exc class.

## 2017-03-31 ENCOUNTER — Encounter (HOSPITAL_COMMUNITY)
Admission: RE | Admit: 2017-03-31 | Discharge: 2017-03-31 | Disposition: A | Discharge status: Home / Self Care | Payer: Managed Care, Other (non HMO) | Source: Ambulatory Visit | Attending: Cardiology | Admitting: Cardiology

## 2017-03-31 ENCOUNTER — Encounter (HOSPITAL_COMMUNITY): Payer: Self-pay

## 2017-03-31 VITALS — BP 124/74 | HR 71 | Ht 70.25 in | Wt 172.6 lb

## 2017-03-31 DIAGNOSIS — I10 Essential (primary) hypertension: Secondary | ICD-10-CM | POA: Insufficient documentation

## 2017-03-31 DIAGNOSIS — Z951 Presence of aortocoronary bypass graft: Secondary | ICD-10-CM | POA: Insufficient documentation

## 2017-03-31 DIAGNOSIS — Z7982 Long term (current) use of aspirin: Secondary | ICD-10-CM | POA: Diagnosis not present

## 2017-03-31 DIAGNOSIS — I214 Non-ST elevation (NSTEMI) myocardial infarction: Secondary | ICD-10-CM | POA: Insufficient documentation

## 2017-03-31 DIAGNOSIS — I2510 Atherosclerotic heart disease of native coronary artery without angina pectoris: Secondary | ICD-10-CM | POA: Diagnosis not present

## 2017-03-31 DIAGNOSIS — E7800 Pure hypercholesterolemia, unspecified: Secondary | ICD-10-CM | POA: Diagnosis not present

## 2017-03-31 DIAGNOSIS — Z79899 Other long term (current) drug therapy: Secondary | ICD-10-CM | POA: Diagnosis not present

## 2017-03-31 DIAGNOSIS — K219 Gastro-esophageal reflux disease without esophagitis: Secondary | ICD-10-CM | POA: Diagnosis not present

## 2017-03-31 DIAGNOSIS — M1990 Unspecified osteoarthritis, unspecified site: Secondary | ICD-10-CM | POA: Diagnosis not present

## 2017-03-31 DIAGNOSIS — Z7902 Long term (current) use of antithrombotics/antiplatelets: Secondary | ICD-10-CM | POA: Insufficient documentation

## 2017-03-31 NOTE — Progress Notes (Signed)
Logan Wells 62 y.o. male DOB: [birth year] MRN: [id number]      Nutrition Note  Dx: NSTEMI, CABG x 4 Past Medical History:  Diagnosis Date  . Arthritis    "left shoulder" (03/09/2017)  . CAD in native artery 12/2016  . Chest pain 11/19/2016  . GERD (gastroesophageal reflux disease)   . High cholesterol   . Hypertension   . NSTEMI (non-ST elevated myocardial infarction) ([facility]) 12/2016; 03/09/2017   Meds reviewed.  HT: Ht Readings from Last 1 Encounters:  03/18/17 5\' 10"  (1.778 m)    WT: Wt Readings from Last 3 Encounters:  03/18/17 169 lb 3.2 oz (76.7 kg)  03/10/17 171 lb 15.3 oz (78 kg)  03/03/17 172 lb (78 kg)     BMI 24.28   Current tobacco use? No   Labs:  Lipid Panel     Component Value Date/Time   CHOL 206 (H) 02/18/2017 0837   TRIG 156 (H) 02/18/2017 0837   HDL 54 02/18/2017 0837   CHOLHDL 3.8 02/18/2017 0837   CHOLHDL 2.7 01/05/2017 0808   VLDL 16 01/05/2017 0808   LDLCALC 121 (H) 02/18/2017 0837    Lab Results  Component Value Date   HGBA1C 5.5 01/05/2017   CBG (last 3)  No results for input(s): GLUCAP in the last 72 hours.  Nutrition Note Spoke with pt. Nutrition plan and goals reviewed with pt. Pt is following Step 1 of the Therapeutic Lifestyle Changes diet. Pt expressed understanding of the information reviewed. Pt aware of nutrition education classes offered.  Nutrition Diagnosis ? Food-and nutrition-related knowledge deficit related to lack of exposure to information as related to diagnosis of: ? CVD   Nutrition Intervention ? Pt's individual nutrition plan and goals reviewed with pt. ? Pt given handouts for: ? Nutrition I class ? Nutrition II class   Nutrition Goal(s):  ? Pt to identify and limit food sources of saturated fat, trans fat, and sodium  Plan:  Pt to attend nutrition classes ? Nutrition I ? Nutrition II ? Portion Distortion  Will provide client-centered nutrition education as part of interdisciplinary care.   Monitor and  evaluate progress toward nutrition goal with team.  Mickle PlumbEdna Julen Rubert, M.Ed, RD, LDN, CDE 03/31/2017 1:28 PM

## 2017-03-31 NOTE — Progress Notes (Signed)
Cardiac Individual Treatment Plan  Patient Details  Name: Logan Wells MRN: [id number] Date of Birth: 1956/01/21 Referring Provider:     CARDIAC REHAB PHASE II ORIENTATION from 03/31/2017 in [facility] CARDIAC REHAB  Referring Provider  [place], Peter, MD.      Initial Encounter Date:    CARDIAC REHAB PHASE II ORIENTATION from 03/31/2017 in [facility] CARDIAC REHAB  Date  03/31/17  Referring Provider  [place], Peter, MD.      Visit Diagnosis: S/P CABG x 4  NSTEMI (non-ST elevated myocardial infarction) [facility])  Patient's Home Medications on Admission:  Current Outpatient Medications:  .  amLODipine (NORVASC) 2.5 MG tablet, Take 1 tablet (2.5 mg total) by mouth daily., Disp: 90 tablet, Rfl: 3 .  aspirin 81 MG chewable tablet, Chew 1 tablet (81 mg total) by mouth daily., Disp: , Rfl:  .  calcium carbonate (TUMS - DOSED IN MG ELEMENTAL CALCIUM) 500 MG chewable tablet, Chew 1 tablet by mouth daily as needed for indigestion or heartburn., Disp: , Rfl:  .  clopidogrel (PLAVIX) 75 MG tablet, Take 1 tablet (75 mg total) by mouth daily., Disp: 30 tablet, Rfl: 2 .  isosorbide mononitrate (IMDUR) 60 MG 24 hr tablet, Take 1 tablet (60 mg total) by mouth daily., Disp: 90 tablet, Rfl: 3 .  metoprolol tartrate (LOPRESSOR) 25 MG tablet, Take 1 tablet (25 mg total) by mouth 2 (two) times daily., Disp: 180 tablet, Rfl: 0 .  nitroGLYCERIN (NITROSTAT) 0.4 MG SL tablet, Place 1 tablet (0.4 mg total) under the tongue every 5 (five) minutes x 3 doses as needed for chest pain., Disp: 25 tablet, Rfl: 1 .  pantoprazole (PROTONIX) 40 MG tablet, Take 1 tablet (40 mg total) by mouth daily., Disp: 30 tablet, Rfl: 1 .  rosuvastatin (CRESTOR) 40 MG tablet, Take 40 mg by mouth daily., Disp: , Rfl:  .  atorvastatin (LIPITOR) 40 MG tablet, Take 40 mg by mouth daily., Disp: , Rfl:  .  atorvastatin (LIPITOR) 80 MG tablet, Take 0.5 tablets (40 mg total) by mouth daily at 6 PM. (Patient not  taking: Reported on 03/18/2017), Disp: 90 tablet, Rfl: 0  Past Medical History: Past Medical History:  Diagnosis Date  . Arthritis    "left shoulder" (03/09/2017)  . CAD in native artery 12/2016  . Chest pain 11/19/2016  . GERD (gastroesophageal reflux disease)   . High cholesterol   . Hypertension   . NSTEMI (non-ST elevated myocardial infarction) (HCC) 12/2016; 03/09/2017    Tobacco Use: Social History   Tobacco Use  Smoking Status Never Smoker  Smokeless Tobacco Never Used    Labs: Recent Review Flowsheet Data    Labs for ITP Cardiac and Pulmonary Rehab Latest Ref Rng & Units 01/06/2017 01/06/2017 01/06/2017 01/07/2017 02/18/2017   Cholestrol 100 - 199 mg/dL - - - - 409(W)   LDLCALC 0 - 99 mg/dL - - - - 119(J)   HDL >47 mg/dL - - - - 54   Trlycerides 0 - 149 mg/dL - - - - 829(F)   Hemoglobin A1c 4.8 - 5.6 % - - - - -   PHART 7.350 - 7.450 7.290(L) - 7.317(L) - -   PCO2ART 32.0 - 48.0 mmHg 45.4 - 41.1 - -   HCO3 20.0 - 28.0 mmol/L 21.9 - 21.1 - -   TCO2 22 - 32 mmol/L 23 25 22 29  -   ACIDBASEDEF 0.0 - 2.0 mmol/L 5.0(H) - 5.0(H) - -   O2SAT % 97.0 -  96.0 - -      Capillary Blood Glucose: Lab Results  Component Value Date   GLUCAP 108 (H) 01/09/2017   GLUCAP 106 (H) 01/09/2017   GLUCAP 101 (H) 01/09/2017   GLUCAP 120 (H) 01/08/2017   GLUCAP 88 01/08/2017     Exercise Target Goals: Date: 03/31/17  Exercise Program Goal: Individual exercise prescription set using results from initial 6 min walk test and THRR while considering  patient's activity barriers and safety.   Exercise Prescription Goal: Initial exercise prescription builds to 30-45 minutes a day of aerobic activity, 2-3 days per week.  Home exercise guidelines will be given to patient during program as part of exercise prescription that the participant will acknowledge.  Activity Barriers & Risk Stratification: Activity Barriers & Cardiac Risk Stratification - 03/31/17 1334      Activity Barriers &  Cardiac Risk Stratification   Activity Barriers  None    Cardiac Risk Stratification  High       6 Minute Walk: 6 Minute Walk    Row Name 03/31/17 1352         6 Minute Walk   Phase  Initial     Distance  1829 feet     Walk Time  6 minutes     # of Rest Breaks  0     MPH  3.46     METS  4.43     RPE  9     VO2 Peak  15.49     Symptoms  No     Resting HR  71 bpm     Resting BP  124/74     Resting Oxygen Saturation   97 %     Exercise Oxygen Saturation  during 6 min walk  97 %     Max Ex. HR  80 bpm     Max Ex. BP  124/68     2 Minute Post BP  118/62        Oxygen Initial Assessment:   Oxygen Re-Evaluation:   Oxygen Discharge (Final Oxygen Re-Evaluation):   Initial Exercise Prescription: Initial Exercise Prescription - 03/31/17 1500      Date of Initial Exercise RX and Referring Provider   Date  03/31/17    Referring Provider  [place], Peter, MD.      Treadmill   MPH  3    Grade  0    Minutes  10    METs  3.3      Bike   Level  1.4    Minutes  10    METs  4.41      NuStep   Level  4    SPM  85    Minutes  10    METs  3      Prescription Details   Frequency (times per week)  3    Duration  Progress to 30 minutes of continuous aerobic without signs/symptoms of physical distress      Intensity   THRR 40-80% of Max Heartrate  64-129    Ratings of Perceived Exertion  11-13    Perceived Dyspnea  0-4      Progression   Progression  Continue to progress workloads to maintain intensity without signs/symptoms of physical distress.      Resistance Training   Training Prescription  Yes    Weight  4lbs    Reps  10-15       Perform Capillary Blood Glucose checks as needed.  Exercise Prescription  Changes:   Exercise Comments:   Exercise Goals and Review: Exercise Goals    Row Name 03/31/17 1402             Exercise Goals   Increase Physical Activity  Yes       Intervention  Provide advice, education, support and counseling about  physical activity/exercise needs.;Develop an individualized exercise prescription for aerobic and resistive training based on initial evaluation findings, risk stratification, comorbidities and participant's personal goals.       Expected Outcomes  Short Term: Attend rehab on a regular basis to increase amount of physical activity.;Long Term: Exercising regularly at least 3-5 days a week.;Long Term: Add in home exercise to make exercise part of routine and to increase amount of physical activity.       Increase Strength and Stamina  Yes       Intervention  Provide advice, education, support and counseling about physical activity/exercise needs.;Develop an individualized exercise prescription for aerobic and resistive training based on initial evaluation findings, risk stratification, comorbidities and participant's personal goals.       Expected Outcomes  Short Term: Increase workloads from initial exercise prescription for resistance, speed, and METs.;Short Term: Perform resistance training exercises routinely during rehab and add in resistance training at home;Long Term: Improve cardiorespiratory fitness, muscular endurance and strength as measured by increased METs and functional capacity ( )       Able to understand and use rate of perceived exertion (RPE) scale  Yes       Intervention  Provide education and explanation on how to use RPE scale       Expected Outcomes  Short Term: Able to use RPE daily in rehab to express subjective intensity level;Long Term:  Able to use RPE to guide intensity level when exercising independently       Knowledge and understanding of Target Heart Rate Range (THRR)  Yes       Intervention  Provide education and explanation of THRR including how the numbers were predicted and where they are located for reference       Expected Outcomes  Short Term: Able to state/look up THRR;Long Term: Able to use THRR to govern intensity when exercising independently;Short Term: Able  to use daily as guideline for intensity in rehab       Able to check pulse independently  Yes       Intervention  Provide education and demonstration on how to check pulse in carotid and radial arteries.;Review the importance of being able to check your own pulse for safety during independent exercise       Expected Outcomes  Short Term: Able to explain why pulse checking is important during independent exercise;Long Term: Able to check pulse independently and accurately       Understanding of Exercise Prescription  Yes       Intervention  Provide education, explanation, and written materials on patient's individual exercise prescription       Expected Outcomes  Short Term: Able to explain program exercise prescription;Long Term: Able to explain home exercise prescription to exercise independently          Exercise Goals Re-Evaluation :    Discharge Exercise Prescription (Final Exercise Prescription Changes):   Nutrition:  Target Goals: Understanding of nutrition guidelines, daily intake of sodium 1500mg , cholesterol 200mg , calories 30% from fat and 7% or less from saturated fats, daily to have 5 or more servings of fruits and vegetables.  Biometrics: Pre Biometrics - 03/31/17 1403  Pre Biometrics   Height  5' 10.25" (1.784 m)    Weight  172 lb 9.9 oz (78.3 kg)    Waist Circumference  36.5 inches    Hip Circumference  38.5 inches    Waist to Hip Ratio  0.95 %    BMI (Calculated)  24.6    Triceps Skinfold  4 mm    % Body Fat  19.1 %    Grip Strength  46.5 kg    Flexibility  12 in    Single Leg Stand  22 seconds        Nutrition Therapy Plan and Nutrition Goals: Nutrition Therapy & Goals - 03/31/17 1331      Nutrition Therapy   Diet  Heart Healthy      Intervention Plan   Intervention  Prescribe, educate and counsel regarding individualized specific dietary modifications aiming towards targeted core components such as weight, hypertension, lipid management, diabetes,  heart failure and other comorbidities.    Expected Outcomes  Short Term Goal: Understand basic principles of dietary content, such as calories, fat, sodium, cholesterol and nutrients.;Long Term Goal: Adherence to prescribed nutrition plan.       Nutrition Assessments:   Nutrition Goals Re-Evaluation:   Nutrition Goals Re-Evaluation:   Nutrition Goals Discharge (Final Nutrition Goals Re-Evaluation):   Psychosocial: Target Goals: Acknowledge presence or absence of significant depression and/or stress, maximize coping skills, provide positive support system. Participant is able to verbalize types and ability to use techniques and skills needed for reducing stress and depression.  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  Pt unsure of the future and what this may mean.      Family Dynamics   Good Support System?  Yes    Comments  Pt reports his family and wife as being supportive of his rehab participation      Barriers   Psychosocial barriers to participate in program  The patient should benefit from training in stress management and relaxation.      Screening Interventions   Interventions  Encouraged to exercise;Provide feedback about the scores to participant    Expected Outcomes  Long Term Goal: Stressors or current issues are controlled or eliminated.;Short Term goal: Utilizing psychosocial counselor, staff and physician to assist with identification of specific Stressors or current issues interfering with healing process. Setting desired goal for each stressor or current issue identified.       Quality of Life Scores: Quality of Life - 03/31/17 1437      Quality of Life Scores    Health/Function Pre  24.17 %    Socioeconomic Pre  22.21 %    Psych/Spiritual Pre  23.07 %    Family Pre  24 %    GLOBAL Pre  23.5 %      Scores of 19 and below usually indicate a poorer quality of life in these areas.  A difference of  2-3 points is a clinically meaningful difference.  A difference of 2-3 points in the total score of the Quality of Life Index has been associated with significant improvement in overall quality of life, self-image, physical symptoms, and general health in studies assessing change in quality of life.  PHQ-9: Recent Review Flowsheet Data    There is no flowsheet data to display.     Interpretation of Total Score  Total Score Depression Severity:  1-4 = Minimal depression, 5-9 = Mild depression, 10-14 = Moderate depression, 15-19 = Moderately severe depression, 20-27 = Severe depression   Psychosocial Evaluation and Intervention:   Psychosocial Re-Evaluation:   Psychosocial Discharge (Final Psychosocial Re-Evaluation):   Vocational Rehabilitation: Provide vocational rehab assistance to qualifying candidates.   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 Participant will state understanding/return demonstration of topics presented.  Learning Barriers/Preferences: Learning Barriers/Preferences - 03/31/17 1420      Learning Barriers/Preferences   Learning Barriers  Sight    Learning Preferences  None       Education Topics: Count Your Pulse:  -Group instruction provided by verbal instruction, demonstration, patient participation and written materials to support subject.  Instructors address importance of  being able to find your pulse and how to count your pulse when at home without a heart monitor.  Patients get hands on experience counting their pulse with staff help and individually.   Heart Attack, Angina, and Risk Factor Modification:  -Group instruction provided by verbal instruction, video, and written materials to support subject.  Instructors address signs and symptoms of angina and heart attacks.    Also discuss risk factors for heart disease and how to make changes to improve heart health risk factors.   Functional Fitness:  -Group instruction provided by verbal instruction, demonstration, patient participation, and written materials to support subject.  Instructors address safety measures for doing things around the house.  Discuss how to get up and down off the floor, how to pick things up properly, how to safely get out of a chair without assistance, and balance training.   Meditation and Mindfulness:  -Group instruction provided by verbal instruction, patient participation, and written materials to support subject.  Instructor addresses importance of mindfulness and meditation practice to help reduce stress and improve awareness.  Instructor also leads participants through a meditation exercise.    Stretching for Flexibility and Mobility:  -Group instruction provided by verbal instruction, patient participation, and written materials to support subject.  Instructors lead participants through series of stretches that are designed to increase flexibility thus improving mobility.  These stretches are additional exercise for major muscle groups that are typically performed during regular warm up and cool down.   Hands Only CPR:  -Group verbal, video, and participation provides a basic overview of AHA guidelines for community CPR. Role-play of emergencies allow participants the opportunity to practice calling for help and chest compression technique with discussion of AED  use.   Hypertension: -Group verbal and written instruction that provides a basic overview of hypertension including the most recent diagnostic guidelines, risk factor reduction with self-care instructions and medication management.    Nutrition I class: Heart Healthy Eating:  -Group instruction provided by PowerPoint slides, verbal discussion, and written materials to support subject matter. The instructor gives an explanation and review of the Therapeutic Lifestyle Changes diet recommendations, which includes a discussion on lipid goals, dietary fat, sodium, fiber, plant stanol/sterol esters, sugar, and the components  of a well-balanced, healthy diet.   Nutrition II class: Lifestyle Skills:  -Group instruction provided by PowerPoint slides, verbal discussion, and written materials to support subject matter. The instructor gives an explanation and review of label reading, grocery shopping for heart health, heart healthy recipe modifications, and ways to make healthier choices when eating out.   Diabetes Question & Answer:  -Group instruction provided by PowerPoint slides, verbal discussion, and written materials to support subject matter. The instructor gives an explanation and review of diabetes co-morbidities, pre- and post-prandial blood glucose goals, pre-exercise blood glucose goals, signs, symptoms, and treatment of hypoglycemia and hyperglycemia, and foot care basics.   Diabetes Blitz:  -Group instruction provided by PowerPoint slides, verbal discussion, and written materials to support subject matter. The instructor gives an explanation and review of the physiology behind type 1 and type 2 diabetes, diabetes medications and rational behind using different medications, pre- and post-prandial blood glucose recommendations and Hemoglobin A1c goals, diabetes diet, and exercise including blood glucose guidelines for exercising safely.    Portion Distortion:  -Group instruction provided by  PowerPoint slides, verbal discussion, written materials, and food models to support subject matter. The instructor gives an explanation of serving size versus portion size, changes in portions sizes over the last 20 years, and what consists of a serving from each food group.   Stress Management:  -Group instruction provided by verbal instruction, video, and written materials to support subject matter.  Instructors review role of stress in heart disease and how to cope with stress positively.     Exercising on Your Own:  -Group instruction provided by verbal instruction, power point, and written materials to support subject.  Instructors discuss benefits of exercise, components of exercise, frequency and intensity of exercise, and end points for exercise.  Also discuss use of nitroglycerin and activating EMS.  Review options of places to exercise outside of rehab.  Review guidelines for sex with heart disease.   Cardiac Drugs I:  -Group instruction provided by verbal instruction and written materials to support subject.  Instructor reviews cardiac drug classes: antiplatelets, anticoagulants, beta blockers, and statins.  Instructor discusses reasons, side effects, and lifestyle considerations for each drug class.   Cardiac Drugs II:  -Group instruction provided by verbal instruction and written materials to support subject.  Instructor reviews cardiac drug classes: angiotensin converting enzyme inhibitors (ACE-I), angiotensin II receptor blockers (ARBs), nitrates, and calcium channel blockers.  Instructor discusses reasons, side effects, and lifestyle considerations for each drug class.   Anatomy and Physiology of the Circulatory System:  Group verbal and written instruction and models provide basic cardiac anatomy and physiology, with the coronary electrical and arterial systems. Review of: AMI, Angina, Valve disease, Heart Failure, Peripheral Artery Disease, Cardiac Arrhythmia, Pacemakers, and  the ICD.   Other Education:  -Group or individual verbal, written, or video instructions that support the educational goals of the cardiac rehab program.   Holiday Eating Survival Tips:  -Group instruction provided by PowerPoint slides, verbal discussion, and written materials to support subject matter. The instructor gives patients tips, tricks, and techniques to help them not only survive but enjoy the holidays despite the onslaught of food that accompanies the holidays.   Knowledge Questionnaire Score: Knowledge Questionnaire Score - 03/31/17 1437      Knowledge Questionnaire Score   Pre Score  17/24       Core Components/Risk Factors/Patient Goals at Admission: Personal Goals and Risk Factors at Admission - 03/31/17 1439  Core Components/Risk Factors/Patient Goals on Admission   Hypertension  Yes    Intervention  Provide education on lifestyle modifcations including regular physical activity/exercise, weight management, moderate sodium restriction and increased consumption of fresh fruit, vegetables, and low fat dairy, alcohol moderation, and smoking cessation.;Monitor prescription use compliance.    Expected Outcomes  Short Term: Continued assessment and intervention until BP is < 140/26mm HG in hypertensive participants. < 130/5mm HG in hypertensive participants with diabetes, heart failure or chronic kidney disease.;Long Term: Maintenance of blood pressure at goal levels.    Lipids  Yes    Intervention  Provide education and support for participant on nutrition & aerobic/resistive exercise along with prescribed medications to achieve LDL 70mg , HDL >40mg .    Expected Outcomes  Short Term: Participant states understanding of desired cholesterol values and is compliant with medications prescribed. Participant is following exercise prescription and nutrition guidelines.;Long Term: Cholesterol controlled with medications as prescribed, with individualized exercise RX and with  personalized nutrition plan. Value goals: LDL < 70mg , HDL > 40 mg.       Core Components/Risk Factors/Patient Goals Review:    Core Components/Risk Factors/Patient Goals at Discharge (Final Review):    ITP Comments: ITP Comments    Row Name 03/31/17 1330           ITP Comments  Medical Director- Dr. Armanda Magic, MD.          Comments:  Patient attended orientation from  to to review rules and guidelines for program. Completed 6 minute walk test, Intitial ITP, and exercise prescription.  VSS. Telemetry-SR with occ PVC.  Pt asymptomatic with no complaints, tolerated well.  Brief Psychosocial Assessment reveal no immediate barriers to participating in cardiac rehab.  Pt did mention feeling some anxiety and stress related to the uncertainty of the future due to the rehospitalization for NSTEMI and early graft failure.  Pt will benefit from the education class and relaxation techniques.  Pt is looking forward to returning on next week. Alanson Aly, BSN Cardiac and Emergency planning/management officer

## 2017-04-01 ENCOUNTER — Ambulatory Visit (INDEPENDENT_AMBULATORY_CARE_PROVIDER_SITE_OTHER): Payer: Self-pay | Admitting: Cardiothoracic Surgery

## 2017-04-01 ENCOUNTER — Ambulatory Visit (HOSPITAL_COMMUNITY): Payer: Managed Care, Other (non HMO)

## 2017-04-01 ENCOUNTER — Encounter: Payer: Self-pay | Admitting: Cardiothoracic Surgery

## 2017-04-01 VITALS — BP 140/88 | HR 65 | Resp 20 | Ht 70.0 in | Wt 172.0 lb

## 2017-04-01 DIAGNOSIS — Z951 Presence of aortocoronary bypass graft: Secondary | ICD-10-CM

## 2017-04-01 DIAGNOSIS — I2510 Atherosclerotic heart disease of native coronary artery without angina pectoris: Secondary | ICD-10-CM

## 2017-04-01 NOTE — Progress Notes (Signed)
PCP is Kathyrn Lass, MD Referring Provider is Sueanne Margarita, MD  Chief Complaint  Patient presents with  . Routine Post Op    4 week f/u s/p CABG    HPI: Patient returns after urgent CABG x4 in December for non-STEMI.  Patient was progressing well but developed recurrent chest pain in February and was recath.  This demonstrated normal LV function with patent LIMA to LAD and patent vein graft to diagonal with communication to the LAD resulting in moderate atresia of the LIMA graft but good perfusion of the LAD diagonal.  The graft to the OM which had a 60% stenosis was closed because of competitive flow.  A graft to the distal diffusely diseased segment of the posterior descending was closed probably due to poor runoff.  Patient has been doing well and rehabilitating.  He is in cardiac rehab and wishes to increase his activity levels.  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  He knows that sternal precautions are about ready to be released in mid March.  Past Medical History:  Diagnosis Date  . Arthritis    "left shoulder" (03/09/2017)  . CAD in native artery 12/2016  . Chest pain 11/19/2016  . GERD (gastroesophageal reflux disease)   . High cholesterol   . Hypertension   . NSTEMI (non-ST elevated myocardial infarction) ([place]) 12/2016; 03/09/2017    Past Surgical History:  Procedure Laterality Date  . CARDIAC CATHETERIZATION  01/05/2017; 03/09/2017  . CORONARY ARTERY BYPASS GRAFT N/A 01/06/2017   Procedure: CORONARY ARTERY BYPASS GRAFTING (CABG) x4 , USING LEFT INTERNAL MAMMARY ARTERY AND RIGHT GREATER SAPHENOUS VEIN HARVESTED ENDOSCOPICALLY;  Surgeon: Ivin Poot, MD;  Location: [facility];  Service: Open Heart Surgery;  Laterality: N/A;  . LEFT HEART CATH AND CORONARY ANGIOGRAPHY N/A 01/05/2017   Procedure: LEFT HEART CATH AND  CORONARY ANGIOGRAPHY;  Surgeon: Martinique, Keijuan Schellhase M, MD;  Location: [facility];  Service: Cardiovascular;  Laterality: N/A;  . LEFT HEART CATH AND CORS/GRAFTS ANGIOGRAPHY N/A 03/09/2017   Procedure: LEFT HEART CATH AND CORS/GRAFTS ANGIOGRAPHY;  Surgeon: Belva Crome, MD;  Location: [facility];  Service: Cardiovascular;  Laterality: N/A;  . SHOULDER HEMI-ARTHROPLASTY Left    "partial shoulder replacement"  . TEE WITHOUT CARDIOVERSION N/A 01/06/2017   Procedure: TRANSESOPHAGEAL ECHOCARDIOGRAM (TEE);  Surgeon: Prescott Gum, Collier Salina, MD;  Location: [facility];  Service: Open Heart Surgery;  Laterality: N/A;  . ULTRASOUND GUIDANCE FOR VASCULAR ACCESS  03/09/2017   Procedure: Ultrasound Guidance For Vascular Access;  Surgeon: Belva Crome, MD;  Location: [facility];  Service: Cardiovascular;;    Family History  Problem Relation Age of Onset  . Heart disease Mother   . Heart attack Mother 71  . CAD Mother   . Multiple myeloma Father     Social History Social History   Tobacco Use  . Smoking status: Never Smoker  . Smokeless tobacco: Never Used  Substance Use Topics  . Alcohol use: Yes    Alcohol/week: 8.4 oz    Types: 14 Glasses of wine per week    Comment: daily  . Drug use: No    Current Outpatient Medications  Medication Sig Dispense Refill  . amLODipine (NORVASC) 2.5 MG tablet Take 1 tablet (2.5 mg total) by mouth daily. 90 tablet 3  . aspirin 81 MG chewable tablet Chew 1 tablet (  81 mg total) by mouth daily.    . calcium carbonate (TUMS - DOSED IN MG ELEMENTAL CALCIUM) 500 MG chewable tablet Chew 1 tablet by mouth daily as needed for indigestion or heartburn.    . clopidogrel (PLAVIX) 75 MG tablet Take 1 tablet (75 mg total) by mouth daily. 30 tablet 2  . isosorbide mononitrate (IMDUR) 60 MG 24 hr tablet Take 1 tablet (60 mg total) by mouth daily. 90 tablet 3  . metoprolol tartrate (LOPRESSOR) 25 MG tablet Take 1 tablet (25 mg total) by mouth 2 (two) times daily. 180  tablet 0  . pantoprazole (PROTONIX) 40 MG tablet Take 1 tablet (40 mg total) by mouth daily. 30 tablet 1  . rosuvastatin (CRESTOR) 40 MG tablet Take 40 mg by mouth daily.     No current facility-administered medications for this visit.     Allergies  Allergen Reactions  . Lipitor [Atorvastatin Calcium]     Myalgia     Review of Systems  incision is healing well    Good exercise tolerance-3-4 miles on a treadmill daily.  BP 140/88   Pulse 65   Resp 20   Ht 5' 10"  (1.778 m)   Wt 172 lb (78 kg)   SpO2 98% Comment: RA  BMI 24.68 kg/m  Physical Exam      Exam    General- alert and comfortable    Neck- no JVD, no cervical adenopathy palpable, no carotid bruit   Lungs- clear without rales, wheezes   Cor- regular rate and rhythm, no murmur , gallop   Abdomen- soft, non-tender   Extremities - warm, non-tender, minimal edema   Neuro- oriented, appropriate, no focal weakness   Diagnostic Tests: Most recent chest x-ray clear  Impression: Doing well after urgent multivessel CABG to critical LAD-diagonal stenosis with graft closure to a moderately 60% OM and graft closure to a distal circumflex PDA with diffuse disease.  Preserved LV systolic function.  Plan: Importance of heart healthy diet and lifestyle and compliance to cardiac medications discussed and emphasized with patient and family. He will continue his current medications.  He understands sternal precautions will be released in mid March.  He will be seen back by Dr. Martinique in April.  I will see him back as needed or if he has any further questions regarding surgery.  Len Childs, MD [facility] [phone]

## 2017-04-03 ENCOUNTER — Ambulatory Visit (HOSPITAL_COMMUNITY): Payer: Managed Care, Other (non HMO)

## 2017-04-06 ENCOUNTER — Ambulatory Visit (HOSPITAL_COMMUNITY): Payer: Managed Care, Other (non HMO)

## 2017-04-06 ENCOUNTER — Encounter (HOSPITAL_COMMUNITY)
Admission: RE | Admit: 2017-04-06 | Discharge: 2017-04-06 | Disposition: A | Discharge status: Home / Self Care | Payer: Managed Care, Other (non HMO) | Source: Ambulatory Visit | Attending: Cardiology | Admitting: Cardiology

## 2017-04-06 ENCOUNTER — Encounter (HOSPITAL_COMMUNITY): Payer: Self-pay

## 2017-04-06 DIAGNOSIS — I214 Non-ST elevation (NSTEMI) myocardial infarction: Secondary | ICD-10-CM

## 2017-04-06 DIAGNOSIS — Z951 Presence of aortocoronary bypass graft: Secondary | ICD-10-CM

## 2017-04-06 NOTE — Progress Notes (Signed)
Daily Session Note  Patient Details  Name: Logan Wells MRN: [id number] Date of Birth: [birth year] Referring Provider:     CARDIAC REHAB PHASE II ORIENTATION from 03/31/2017 in [facility]  Referring Provider  Martinique, Peter, MD.      Encounter Date: 04/06/2017  Check In: Session Check In - 04/06/17 1135      Check-In   Location  [facility]    Staff Present  Andi Hence, RN, Marga Melnick, RN, Mosie Epstein, MS,ACSM CEP, Exercise Physiologist;Other    Supervising physician immediately available to respond to emergencies  [facility] immediately available    Physician(s)  Dr. Horris Latino     Medication changes reported      No    Fall or balance concerns reported     No    Tobacco Cessation  No Change    Warm-up and Cool-down  Performed as group-led instruction    Resistance Training Performed  Yes    VAD Patient?  No      Pain Assessment   Currently in Pain?  No/denies       Capillary Blood Glucose: No results found for this or any previous visit (from the past 24 hour(s)).    Social History   Tobacco Use  Smoking Status Never Smoker  Smokeless Tobacco Never Used    Goals Met:  Exercise tolerated well  Goals Unmet:  Not Applicable  Comments: Pt started cardiac rehab today.  Pt tolerated light exercise without difficulty. VSS, telemetry-Sinus Rhtyhm, asymptomatic.  Medication list reconciled. Pt denies barriers to medicaiton compliance.  PSYCHOSOCIAL ASSESSMENT:  PHQ-0. Pt exhibits positive coping skills, hopeful outlook with supportive family. No psychosocial needs identified at this time, no psychosocial interventions necessary.    Pt enjoys farming.   Pt oriented to exercise equipment and routine.    Understanding verbalized.Barnet Pall, RN,BSN 04/06/2017 2:20 PM   Dr. Fransico Him is Medical Director for Cardiac Rehab at [facility].

## 2017-04-08 ENCOUNTER — Encounter (HOSPITAL_COMMUNITY)
Admission: RE | Admit: 2017-04-08 | Discharge: 2017-04-08 | Disposition: A | Discharge status: Home / Self Care | Payer: Managed Care, Other (non HMO) | Source: Ambulatory Visit | Attending: Cardiology | Admitting: Cardiology

## 2017-04-08 ENCOUNTER — Ambulatory Visit (HOSPITAL_COMMUNITY): Payer: Managed Care, Other (non HMO)

## 2017-04-08 DIAGNOSIS — I214 Non-ST elevation (NSTEMI) myocardial infarction: Secondary | ICD-10-CM

## 2017-04-08 DIAGNOSIS — Z951 Presence of aortocoronary bypass graft: Secondary | ICD-10-CM

## 2017-04-10 ENCOUNTER — Encounter (HOSPITAL_COMMUNITY)
Admission: RE | Admit: 2017-04-10 | Discharge: 2017-04-10 | Disposition: A | Discharge status: Home / Self Care | Payer: Managed Care, Other (non HMO) | Source: Ambulatory Visit | Attending: Cardiology | Admitting: Cardiology

## 2017-04-10 ENCOUNTER — Ambulatory Visit (HOSPITAL_COMMUNITY): Payer: Managed Care, Other (non HMO)

## 2017-04-10 DIAGNOSIS — I214 Non-ST elevation (NSTEMI) myocardial infarction: Secondary | ICD-10-CM | POA: Diagnosis not present

## 2017-04-10 DIAGNOSIS — Z951 Presence of aortocoronary bypass graft: Secondary | ICD-10-CM

## 2017-04-13 ENCOUNTER — Ambulatory Visit (HOSPITAL_COMMUNITY): Payer: Managed Care, Other (non HMO)

## 2017-04-13 ENCOUNTER — Encounter (HOSPITAL_COMMUNITY)
Admission: RE | Admit: 2017-04-13 | Discharge: 2017-04-13 | Disposition: A | Discharge status: Home / Self Care | Payer: Managed Care, Other (non HMO) | Source: Ambulatory Visit | Attending: Cardiology | Admitting: Cardiology

## 2017-04-13 DIAGNOSIS — Z951 Presence of aortocoronary bypass graft: Secondary | ICD-10-CM

## 2017-04-13 DIAGNOSIS — I214 Non-ST elevation (NSTEMI) myocardial infarction: Secondary | ICD-10-CM | POA: Diagnosis not present

## 2017-04-14 NOTE — Progress Notes (Signed)
Cardiac Individual Treatment Plan  Patient Details  Name: Logan Wells MRN: [id number] Date of Birth: 07/06/1955 Referring Provider:     CARDIAC REHAB PHASE II ORIENTATION from 03/31/2017 in [facility] CARDIAC REHAB  Referring Provider  [place], Peter, MD.      Initial Encounter Date:    CARDIAC REHAB PHASE II ORIENTATION from 03/31/2017 in [facility] CARDIAC REHAB  Date  03/31/17  Referring Provider  [place], Peter, MD.      Visit Diagnosis: S/P CABG x 4  NSTEMI (non-ST elevated myocardial infarction) [facility])  Patient's Home Medications on Admission:  Current Outpatient Medications:  .  amLODipine (NORVASC) 2.5 MG tablet, Take 1 tablet (2.5 mg total) by mouth daily., Disp: 90 tablet, Rfl: 3 .  aspirin 81 MG chewable tablet, Chew 1 tablet (81 mg total) by mouth daily., Disp: , Rfl:  .  calcium carbonate (TUMS - DOSED IN MG ELEMENTAL CALCIUM) 500 MG chewable tablet, Chew 1 tablet by mouth daily as needed for indigestion or heartburn., Disp: , Rfl:  .  clopidogrel (PLAVIX) 75 MG tablet, Take 1 tablet (75 mg total) by mouth daily., Disp: 30 tablet, Rfl: 2 .  isosorbide mononitrate (IMDUR) 60 MG 24 hr tablet, Take 1 tablet (60 mg total) by mouth daily., Disp: 90 tablet, Rfl: 3 .  metoprolol tartrate (LOPRESSOR) 25 MG tablet, Take 1 tablet (25 mg total) by mouth 2 (two) times daily., Disp: 180 tablet, Rfl: 0 .  pantoprazole (PROTONIX) 40 MG tablet, Take 1 tablet (40 mg total) by mouth daily., Disp: 30 tablet, Rfl: 1 .  rosuvastatin (CRESTOR) 40 MG tablet, Take 40 mg by mouth daily., Disp: , Rfl:   Past Medical History: Past Medical History:  Diagnosis Date  . Arthritis    "left shoulder" (03/09/2017)  . CAD in native artery 12/2016  . Chest pain 11/19/2016  . GERD (gastroesophageal reflux disease)   . High cholesterol   . Hypertension   . NSTEMI (non-ST elevated myocardial infarction) (HCC) 12/2016; 03/09/2017    Tobacco Use: Social History    Tobacco Use  Smoking Status Never Smoker  Smokeless Tobacco Never Used    Labs: Recent Review Flowsheet Data    Labs for ITP Cardiac and Pulmonary Rehab Latest Ref Rng & Units 01/06/2017 01/06/2017 01/06/2017 01/07/2017 02/18/2017   Cholestrol 100 - 199 mg/dL - - - - 409(W)   LDLCALC 0 - 99 mg/dL - - - - 119(J)   HDL >47 mg/dL - - - - 54   Trlycerides 0 - 149 mg/dL - - - - 829(F)   Hemoglobin A1c 4.8 - 5.6 % - - - - -   PHART 7.350 - 7.450 7.290(L) - 7.317(L) - -   PCO2ART 32.0 - 48.0 mmHg 45.4 - 41.1 - -   HCO3 20.0 - 28.0 mmol/L 21.9 - 21.1 - -   TCO2 22 - 32 mmol/L 23 25 22 29  -   ACIDBASEDEF 0.0 - 2.0 mmol/L 5.0(H) - 5.0(H) - -   O2SAT % 97.0 - 96.0 - -      Capillary Blood Glucose: Lab Results  Component Value Date   GLUCAP 108 (H) 01/09/2017   GLUCAP 106 (H) 01/09/2017   GLUCAP 101 (H) 01/09/2017   GLUCAP 120 (H) 01/08/2017   GLUCAP 88 01/08/2017     Exercise Target Goals:    Exercise Program Goal: Individual exercise prescription set using results from initial 6 min walk test and THRR while considering  patient's activity barriers and  safety.   Exercise Prescription Goal: Initial exercise prescription builds to 30-45 minutes a day of aerobic activity, 2-3 days per week.  Home exercise guidelines will be given to patient during program as part of exercise prescription that the participant will acknowledge.  Activity Barriers & Risk Stratification: Activity Barriers & Cardiac Risk Stratification - 03/31/17 1334      Activity Barriers & Cardiac Risk Stratification   Activity Barriers  None    Cardiac Risk Stratification  High       6 Minute Walk: 6 Minute Walk    Row Name 03/31/17 1352         6 Minute Walk   Phase  Initial     Distance  1829 feet     Walk Time  6 minutes     # of Rest Breaks  0     MPH  3.46     METS  4.43     RPE  9     VO2 Peak  15.49     Symptoms  No     Resting HR  71 bpm     Resting BP  124/74     Resting Oxygen  Saturation   97 %     Exercise Oxygen Saturation  during 6 min walk  97 %     Max Ex. HR  80 bpm     Max Ex. BP  124/68     2 Minute Post BP  118/62        Oxygen Initial Assessment:   Oxygen Re-Evaluation:   Oxygen Discharge (Final Oxygen Re-Evaluation):   Initial Exercise Prescription: Initial Exercise Prescription - 03/31/17 1500      Date of Initial Exercise RX and Referring Provider   Date  03/31/17    Referring Provider  [place], Peter, MD.      Treadmill   MPH  3    Grade  0    Minutes  10    METs  3.3      Bike   Level  1.4    Minutes  10    METs  4.41      NuStep   Level  4    SPM  85    Minutes  10    METs  3      Prescription Details   Frequency (times per week)  3    Duration  Progress to 30 minutes of continuous aerobic without signs/symptoms of physical distress      Intensity   THRR 40-80% of Max Heartrate  64-129    Ratings of Perceived Exertion  11-13    Perceived Dyspnea  0-4      Progression   Progression  Continue to progress workloads to maintain intensity without signs/symptoms of physical distress.      Resistance Training   Training Prescription  Yes    Weight  4lbs    Reps  10-15       Perform Capillary Blood Glucose checks as needed.  Exercise Prescription Changes:  Exercise Prescription Changes    Row Name 04/06/17 1026 04/13/17 1042           Response to Exercise   Blood Pressure (Admit)  120/80  132/84      Blood Pressure (Exercise)  164/82  120/80      Blood Pressure (Exit)  126/82  108/78      Heart Rate (Admit)  76 bpm  75 bpm      Heart  Rate (Exercise)  126 bpm  122 bpm      Heart Rate (Exit)  73 bpm  84 bpm      Rating of Perceived Exertion (Exercise)  11  13      Perceived Dyspnea (Exercise)  0  0      Symptoms  None  None      Comments  Pt oriented to exercise equipment   -      Duration  Continue with 30 min of aerobic exercise without signs/symptoms of physical distress.  Continue with 30 min of  aerobic exercise without signs/symptoms of physical distress.      Intensity  THRR New  THRR unchanged        Progression   Progression  Continue to progress workloads to maintain intensity without signs/symptoms of physical distress.  Continue to progress workloads to maintain intensity without signs/symptoms of physical distress.      Average METs  3.67  4.46        Resistance Training   Training Prescription  No  Yes      Weight  -  5lbs      Reps  -  10-15      Time  -  10 Minutes        Interval Training   Interval Training  No  No        Treadmill   MPH  3  4      Grade  0  0      Minutes  10  10      METs  3.3  4.06        Bike   Level  1.4  1.4      Minutes  10  10      METs  4.43  4.42        NuStep   Level  4  6      SPM  85  85      Minutes  10  10      METs  3.3  4.9         Exercise Comments:  Exercise Comments    Row Name 04/06/17 1030 04/15/17 1429         Exercise Comments  Pt oriented to exericse equipment. Pt had a great start with exercise.   Reviewed Home Exericse Program and goals with pt. Pt is currently exercising at local gym. He is currently exercising on Tues & Thurs for 30-50 mins. Pt will continue to exericse 2-3 days a week at gym in addition to Cardiac Rehab.          Exercise Goals and Review:  Exercise Goals    Row Name 03/31/17 1402             Exercise Goals   Increase Physical Activity  Yes       Intervention  Provide advice, education, support and counseling about physical activity/exercise needs.;Develop an individualized exercise prescription for aerobic and resistive training based on initial evaluation findings, risk stratification, comorbidities and participant's personal goals.       Expected Outcomes  Short Term: Attend rehab on a regular basis to increase amount of physical activity.;Long Term: Exercising regularly at least 3-5 days a week.;Long Term: Add in home exercise to make exercise part of routine and to  increase amount of physical activity.       Increase Strength and Stamina  Yes       Intervention  Provide  advice, education, support and counseling about physical activity/exercise needs.;Develop an individualized exercise prescription for aerobic and resistive training based on initial evaluation findings, risk stratification, comorbidities and participant's personal goals.       Expected Outcomes  Short Term: Increase workloads from initial exercise prescription for resistance, speed, and METs.;Short Term: Perform resistance training exercises routinely during rehab and add in resistance training at home;Long Term: Improve cardiorespiratory fitness, muscular endurance and strength as measured by increased METs and functional capacity ( )       Able to understand and use rate of perceived exertion (RPE) scale  Yes       Intervention  Provide education and explanation on how to use RPE scale       Expected Outcomes  Short Term: Able to use RPE daily in rehab to express subjective intensity level;Long Term:  Able to use RPE to guide intensity level when exercising independently       Knowledge and understanding of Target Heart Rate Range (THRR)  Yes       Intervention  Provide education and explanation of THRR including how the numbers were predicted and where they are located for reference       Expected Outcomes  Short Term: Able to state/look up THRR;Long Term: Able to use THRR to govern intensity when exercising independently;Short Term: Able to use daily as guideline for intensity in rehab       Able to check pulse independently  Yes       Intervention  Provide education and demonstration on how to check pulse in carotid and radial arteries.;Review the importance of being able to check your own pulse for safety during independent exercise       Expected Outcomes  Short Term: Able to explain why pulse checking is important during independent exercise;Long Term: Able to check pulse independently  and accurately       Understanding of Exercise Prescription  Yes       Intervention  Provide education, explanation, and written materials on patient's individual exercise prescription       Expected Outcomes  Short Term: Able to explain program exercise prescription;Long Term: Able to explain home exercise prescription to exercise independently          Exercise Goals Re-Evaluation : Exercise Goals Re-Evaluation    Row Name 04/15/17 1445             Exercise Goal Re-Evaluation   Exercise Goals Review  Increase Physical Activity;Able to understand and use rate of perceived exertion (RPE) scale;Knowledge and understanding of Target Heart Rate Range (THRR);Understanding of Exercise Prescription;Increase Strength and Stamina;Able to check pulse independently       Comments  Reviewed Home Exercise Program with pt, in addition to THRR, RPE Scale, Weather conditions, Endpoints of exericse and NTG use. Pt is off to a great start with exercise. Pt has expressed interest in HIIT training. Will follow up with doctor to get clearance to start HIIT Training.        Expected Outcomes  Pt will continue to improve cardiovascular endurance and exercise 5 days a week for 30-50 minutes. Will continue to monitor and progress pt as tolerated.            Discharge Exercise Prescription (Final Exercise Prescription Changes): Exercise Prescription Changes - 04/13/17 1042      Response to Exercise   Blood Pressure (Admit)  132/84    Blood Pressure (Exercise)  120/80    Blood Pressure (Exit)  108/78  Heart Rate (Admit)  75 bpm    Heart Rate (Exercise)  122 bpm    Heart Rate (Exit)  84 bpm    Rating of Perceived Exertion (Exercise)  13    Perceived Dyspnea (Exercise)  0    Symptoms  None    Duration  Continue with 30 min of aerobic exercise without signs/symptoms of physical distress.    Intensity  THRR unchanged      Progression   Progression  Continue to progress workloads to maintain intensity  without signs/symptoms of physical distress.    Average METs  4.46      Resistance Training   Training Prescription  Yes    Weight  5lbs    Reps  10-15    Time  10 Minutes      Interval Training   Interval Training  No      Treadmill   MPH  4    Grade  0    Minutes  10    METs  4.06      Bike   Level  1.4    Minutes  10    METs  4.42      NuStep   Level  6    SPM  85    Minutes  10    METs  4.9       Nutrition:  Target Goals: Understanding of nutrition guidelines, daily intake of sodium 1500mg , cholesterol 200mg , calories 30% from fat and 7% or less from saturated fats, daily to have 5 or more servings of fruits and vegetables.  Biometrics: Pre Biometrics - 03/31/17 1403      Pre Biometrics   Height  5' 10.25" (1.784 m)    Weight  172 lb 9.9 oz (78.3 kg)    Waist Circumference  36.5 inches    Hip Circumference  38.5 inches    Waist to Hip Ratio  0.95 %    BMI (Calculated)  24.6    Triceps Skinfold  4 mm    % Body Fat  19.1 %    Grip Strength  46.5 kg    Flexibility  12 in    Single Leg Stand  22 seconds        Nutrition Therapy Plan and Nutrition Goals: Nutrition Therapy & Goals - 03/31/17 1331      Nutrition Therapy   Diet  Heart Healthy      Personal Nutrition Goals   Nutrition Goal  Pt to identify and limit food sources of saturated fat, trans fat, and sodium      Intervention Plan   Intervention  Prescribe, educate and counsel regarding individualized specific dietary modifications aiming towards targeted core components such as weight, hypertension, lipid management, diabetes, heart failure and other comorbidities.    Expected Outcomes  Short Term Goal: Understand basic principles of dietary content, such as calories, fat, sodium, cholesterol and nutrients.;Long Term Goal: Adherence to prescribed nutrition plan.       Nutrition Assessments: Nutrition Assessments - 04/01/17 0854      MEDFICTS Scores   Pre Score  42       Nutrition  Goals Re-Evaluation:   Nutrition Goals Re-Evaluation:   Nutrition Goals Discharge (Final Nutrition Goals Re-Evaluation):   Psychosocial: Target Goals: Acknowledge presence or absence of significant depression and/or stress, maximize coping skills, provide positive support system. Participant is able to verbalize types and ability to use techniques and skills needed for reducing stress and depression.  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  Pt unsure of the future and what this may mean.      Family Dynamics   Good Support System?  Yes    Comments  Pt reports his family and wife as being supportive of his rehab participation      Barriers   Psychosocial barriers to participate in program  The patient should benefit from training in stress management and relaxation.      Screening Interventions   Interventions  Encouraged to exercise;Provide feedback about the scores to participant    Expected Outcomes  Long Term Goal: Stressors or current issues are controlled or eliminated.;Short Term goal: Utilizing psychosocial counselor, staff and physician to assist with identification of specific Stressors or current issues interfering with healing process. Setting desired goal for each stressor or current issue identified.       Quality of Life Scores: Quality of Life - 03/31/17 1437      Quality of Life Scores   Health/Function Pre  24.17 %    Socioeconomic Pre  22.21 %    Psych/Spiritual Pre  23.07 %    Family Pre  24 %    GLOBAL Pre  23.5 %      Scores of 19 and below usually indicate a poorer quality of life in these areas.  A difference of  2-3 points is a clinically meaningful difference.  A  difference of 2-3 points in the total score of the Quality of Life Index has been associated with significant improvement in overall quality of life, self-image, physical symptoms, and general health in studies assessing change in quality of life.  PHQ-9: Recent Review Flowsheet Data    Depression screen [facility] [place] 2/9 04/06/2017   Decreased Interest 0   Down, Depressed, Hopeless 0   PHQ - 2 Score 0     Interpretation of Total Score  Total Score Depression Severity:  1-4 = Minimal depression, 5-9 = Mild depression, 10-14 = Moderate depression, 15-19 = Moderately severe depression, 20-27 = Severe depression   Psychosocial Evaluation and Intervention:   Psychosocial Re-Evaluation:   Psychosocial Discharge (Final Psychosocial Re-Evaluation):   Vocational Rehabilitation: Provide vocational rehab assistance to qualifying candidates.   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 Participant will state understanding/return demonstration of topics presented.  Learning Barriers/Preferences: Learning Barriers/Preferences - 03/31/17 1420      Learning Barriers/Preferences   Learning Barriers  Sight    Learning Preferences  None       Education Topics: Count Your Pulse:  -Group instruction provided by verbal instruction, demonstration, patient participation and written materials to support subject.  Instructors address importance of being able to find your pulse and how to count your pulse when at home without a heart monitor.  Patients get hands on experience counting their pulse with staff help and individually.   Heart Attack,  Angina, and Risk  Factor Modification:  -Group instruction provided by verbal instruction, video, and written materials to support subject.  Instructors address signs and symptoms of angina and heart attacks.    Also discuss risk factors for heart disease and how to make changes to improve heart health risk factors.   Functional Fitness:  -Group instruction provided by verbal instruction, demonstration, patient participation, and written materials to support subject.  Instructors address safety measures for doing things around the house.  Discuss how to get up and down off the floor, how to pick things up properly, how to safely get out of a chair without assistance, and balance training.   CARDIAC REHAB PHASE II EXERCISE from 04/10/2017 in [facility] CARDIAC REHAB  Date  04/10/17  Instruction Review Code  2- Demonstrated Understanding      Meditation and Mindfulness:  -Group instruction provided by verbal instruction, patient participation, and written materials to support subject.  Instructor addresses importance of mindfulness and meditation practice to help reduce stress and improve awareness.  Instructor also leads participants through a meditation exercise.    Stretching for Flexibility and Mobility:  -Group instruction provided by verbal instruction, patient participation, and written materials to support subject.  Instructors lead participants through series of stretches that are designed to increase flexibility thus improving mobility.  These stretches are additional exercise for major muscle groups that are typically performed during regular warm up and cool down.   Hands Only CPR:  -Group verbal, video, and participation provides a basic overview of AHA guidelines for community CPR. Role-play of emergencies allow participants the opportunity to practice calling for help and chest compression technique with discussion of AED use.   Hypertension: -Group verbal and written instruction  that provides a basic overview of hypertension including the most recent diagnostic guidelines, risk factor reduction with self-care instructions and medication management.    Nutrition I class: Heart Healthy Eating:  -Group instruction provided by PowerPoint slides, verbal discussion, and written materials to support subject matter. The instructor gives an explanation and review of the Therapeutic Lifestyle Changes diet recommendations, which includes a discussion on lipid goals, dietary fat, sodium, fiber, plant stanol/sterol esters, sugar, and the components of a well-balanced, healthy diet.   Nutrition II class: Lifestyle Skills:  -Group instruction provided by PowerPoint slides, verbal discussion, and written materials to support subject matter. The instructor gives an explanation and review of label reading, grocery shopping for heart health, heart healthy recipe modifications, and ways to make healthier choices when eating out.   Diabetes Question & Answer:  -Group instruction provided by PowerPoint slides, verbal discussion, and written materials to support subject matter. The instructor gives an explanation and review of diabetes co-morbidities, pre- and post-prandial blood glucose goals, pre-exercise blood glucose goals, signs, symptoms, and treatment of hypoglycemia and hyperglycemia, and foot care basics.   Diabetes Blitz:  -Group instruction provided by PowerPoint slides, verbal discussion, and written materials to support subject matter. The instructor gives an explanation and review of the physiology behind type 1 and type 2 diabetes, diabetes medications and rational behind using different medications, pre- and post-prandial blood glucose recommendations and Hemoglobin A1c goals, diabetes diet, and exercise including blood glucose guidelines for exercising safely.    Portion Distortion:  -Group instruction provided by PowerPoint slides, verbal discussion, written materials, and  food models to support subject matter. The instructor gives an explanation of serving size versus portion size, changes in portions sizes over the last 20 years,  and what consists of a serving from each food group.   Stress Management:  -Group instruction provided by verbal instruction, video, and written materials to support subject matter.  Instructors review role of stress in heart disease and how to cope with stress positively.     Exercising on Your Own:  -Group instruction provided by verbal instruction, power point, and written materials to support subject.  Instructors discuss benefits of exercise, components of exercise, frequency and intensity of exercise, and end points for exercise.  Also discuss use of nitroglycerin and activating EMS.  Review options of places to exercise outside of rehab.  Review guidelines for sex with heart disease.   Cardiac Drugs I:  -Group instruction provided by verbal instruction and written materials to support subject.  Instructor reviews cardiac drug classes: antiplatelets, anticoagulants, beta blockers, and statins.  Instructor discusses reasons, side effects, and lifestyle considerations for each drug class.   Cardiac Drugs II:  -Group instruction provided by verbal instruction and written materials to support subject.  Instructor reviews cardiac drug classes: angiotensin converting enzyme inhibitors (ACE-I), angiotensin II receptor blockers (ARBs), nitrates, and calcium channel blockers.  Instructor discusses reasons, side effects, and lifestyle considerations for each drug class.   Anatomy and Physiology of the Circulatory System:  Group verbal and written instruction and models provide basic cardiac anatomy and physiology, with the coronary electrical and arterial systems. Review of: AMI, Angina, Valve disease, Heart Failure, Peripheral Artery Disease, Cardiac Arrhythmia, Pacemakers, and the ICD.   Other Education:  -Group or individual verbal,  written, or video instructions that support the educational goals of the cardiac rehab program.   CARDIAC REHAB PHASE II EXERCISE from 04/10/2017 in [facility] CARDIAC REHAB  Date  04/08/17  Educator  CVA video  Instruction Review Code  2- Demonstrated Understanding      Holiday Eating Survival Tips:  -Group instruction provided by PowerPoint slides, verbal discussion, and written materials to support subject matter. The instructor gives patients tips, tricks, and techniques to help them not only survive but enjoy the holidays despite the onslaught of food that accompanies the holidays.   Knowledge Questionnaire Score: Knowledge Questionnaire Score - 03/31/17 1437      Knowledge Questionnaire Score   Pre Score  17/24       Core Components/Risk Factors/Patient Goals at Admission: Personal Goals and Risk Factors at Admission - 03/31/17 1439      Core Components/Risk Factors/Patient Goals on Admission   Hypertension  Yes    Intervention  Provide education on lifestyle modifcations including regular physical activity/exercise, weight management, moderate sodium restriction and increased consumption of fresh fruit, vegetables, and low fat dairy, alcohol moderation, and smoking cessation.;Monitor prescription use compliance.    Expected Outcomes  Short Term: Continued assessment and intervention until BP is < 140/43mm HG in hypertensive participants. < 130/96mm HG in hypertensive participants with diabetes, heart failure or chronic kidney disease.;Long Term: Maintenance of blood pressure at goal levels.    Lipids  Yes    Intervention  Provide education and support for participant on nutrition & aerobic/resistive exercise along with prescribed medications to achieve LDL 70mg , HDL >40mg .    Expected Outcomes  Short Term: Participant states understanding of desired cholesterol values and is compliant with medications prescribed. Participant is following exercise prescription and  nutrition guidelines.;Long Term: Cholesterol controlled with medications as prescribed, with individualized exercise RX and with personalized nutrition plan. Value goals: LDL < 70mg , HDL > 40 mg.  Core Components/Risk Factors/Patient Goals Review:    Core Components/Risk Factors/Patient Goals at Discharge (Final Review):    ITP Comments: ITP Comments    Row Name 03/31/17 1330 04/14/17 1224 04/14/17 1226       ITP Comments  Medical Director- Dr. Armanda Magic, MD.  30 day ITP review. Terrell is off to a good start to exercise  30 day ITP review. Asim is off to a good start to exercise after completing 5 sessions        Comments: See ITP comments.Gladstone Lighter, RN,BSN 04/16/2017 11:36 AM

## 2017-04-15 ENCOUNTER — Ambulatory Visit (HOSPITAL_COMMUNITY): Payer: Managed Care, Other (non HMO)

## 2017-04-15 ENCOUNTER — Encounter (HOSPITAL_COMMUNITY)
Admission: RE | Admit: 2017-04-15 | Discharge: 2017-04-15 | Disposition: A | Discharge status: Home / Self Care | Payer: Managed Care, Other (non HMO) | Source: Ambulatory Visit | Attending: Cardiology | Admitting: Cardiology

## 2017-04-15 DIAGNOSIS — I214 Non-ST elevation (NSTEMI) myocardial infarction: Secondary | ICD-10-CM | POA: Diagnosis not present

## 2017-04-15 DIAGNOSIS — Z951 Presence of aortocoronary bypass graft: Secondary | ICD-10-CM

## 2017-04-15 NOTE — Progress Notes (Signed)
I have reviewed a Home Exercise Prescription with Logan Wells . Logan Wells is  currently exercising at local gym 2 days a week.  The patient was advised to continue exercising 2-3 days a week for 35-50 minutes.  Logan Wells and I discussed how to progress their exercise prescription.  The patient stated that their goals were to get back into weight training, but was waiting on doctor's clearance.  The patient stated that they understand the exercise prescription.  We reviewed exercise guidelines, target heart rate during exercise, weather,endpoints for exercise, and goals.  Patient is encouraged to come to me with any questions. I will continue to follow up with the patient to assist them with progression and safety.    Logan Ceriseyara R Carlise Stofer MS, ACSM CEP 2:27 PM 04/15/2017

## 2017-04-17 ENCOUNTER — Ambulatory Visit (HOSPITAL_COMMUNITY): Payer: Managed Care, Other (non HMO)

## 2017-04-17 ENCOUNTER — Encounter (HOSPITAL_COMMUNITY)
Admission: RE | Admit: 2017-04-17 | Discharge: 2017-04-17 | Disposition: A | Discharge status: Home / Self Care | Payer: Managed Care, Other (non HMO) | Source: Ambulatory Visit | Attending: Cardiology | Admitting: Cardiology

## 2017-04-17 DIAGNOSIS — Z951 Presence of aortocoronary bypass graft: Secondary | ICD-10-CM

## 2017-04-17 DIAGNOSIS — I214 Non-ST elevation (NSTEMI) myocardial infarction: Secondary | ICD-10-CM

## 2017-04-20 ENCOUNTER — Encounter (HOSPITAL_COMMUNITY)
Admission: RE | Admit: 2017-04-20 | Discharge: 2017-04-20 | Disposition: A | Discharge status: Home / Self Care | Payer: Managed Care, Other (non HMO) | Source: Ambulatory Visit | Attending: Cardiology | Admitting: Cardiology

## 2017-04-20 ENCOUNTER — Ambulatory Visit (HOSPITAL_COMMUNITY): Payer: Managed Care, Other (non HMO)

## 2017-04-20 DIAGNOSIS — I214 Non-ST elevation (NSTEMI) myocardial infarction: Secondary | ICD-10-CM | POA: Diagnosis not present

## 2017-04-20 DIAGNOSIS — Z951 Presence of aortocoronary bypass graft: Secondary | ICD-10-CM

## 2017-04-20 NOTE — Progress Notes (Signed)
Jerl Santosllen Chui 62 y.o. male DOB: 1955-03-20 MRN: [id number]      Nutrition Note  Dx: NSTEMI, CABG x 4  Labs:   Nutrition Note Spoke with pt. Nutrition plan and survey reviewed with pt. Pt is following Step 1 of the Therapeutic Lifestyle Changes diet. Per discussion, pt appears to be following Step 2 of the TLC diet. Pt expressed understanding of the information reviewed. Pt aware of nutrition education classes offered. Pt denies questions re: nutrition class handouts previously given.   Nutrition Diagnosis ? Food-and nutrition-related knowledge deficit related to lack of exposure to information as related to diagnosis of: ? CVD   Nutrition Intervention ? Pt's individual nutrition plan reviewed with pt. ? Benefits of adopting Heart Healthy diet discussed when Med[employer]icts reviewed.  Nutrition Goal(s):  ? Pt to identify and limit food sources of saturated fat, trans fat, and sodium  Plan:  Pt to attend nutrition classes ? Nutrition I ? Nutrition II ? Portion Distortion  Will provide client-centered nutrition education as part of interdisciplinary care.   Monitor and evaluate progress toward nutrition goal with team.  Mickle PlumbEdna Errin Chewning, M.Ed, RD, LDN, CDE 04/20/2017 12:11 PM

## 2017-04-22 ENCOUNTER — Other Ambulatory Visit: Payer: Self-pay | Admitting: Physician Assistant

## 2017-04-22 ENCOUNTER — Encounter (HOSPITAL_COMMUNITY)
Admission: RE | Admit: 2017-04-22 | Discharge: 2017-04-22 | Disposition: A | Discharge status: Home / Self Care | Payer: Managed Care, Other (non HMO) | Source: Ambulatory Visit | Attending: Cardiology | Admitting: Cardiology

## 2017-04-22 ENCOUNTER — Ambulatory Visit (HOSPITAL_COMMUNITY): Payer: Managed Care, Other (non HMO)

## 2017-04-22 DIAGNOSIS — I214 Non-ST elevation (NSTEMI) myocardial infarction: Secondary | ICD-10-CM

## 2017-04-22 DIAGNOSIS — Z951 Presence of aortocoronary bypass graft: Secondary | ICD-10-CM

## 2017-04-22 LAB — HEPATIC FUNCTION PANEL
ALBUMIN: 4.7 g/dL (ref 3.5–5.5)
ALK PHOS: 60 IU/L (ref 39–117)
ALT: 37 IU/L (ref 0–44)
AST: 34 IU/L (ref 0–40)
BILIRUBIN TOTAL: 0.4 mg/dL (ref 0.0–1.2)
BILIRUBIN, DIRECT: 0.13 mg/dL (ref 0.00–0.40)
Total Protein: 7.8 g/dL (ref 6.0–8.5)

## 2017-04-22 LAB — LIPID PANEL
CHOL/HDL RATIO: 3 ratio (ref 0.0–5.0)
Cholesterol, Total: 216 mg/dL — ABNORMAL HIGH (ref 100–199)
HDL: 71 mg/dL (ref >39)
LDL Calculated: 122 mg/dL — ABNORMAL HIGH (ref 0–99)
Triglycerides: 115 mg/dL (ref 0–149)
VLDL Cholesterol Cal: 23 mg/dL (ref 5–40)

## 2017-04-24 ENCOUNTER — Ambulatory Visit (HOSPITAL_COMMUNITY): Payer: Managed Care, Other (non HMO)

## 2017-04-24 ENCOUNTER — Encounter (HOSPITAL_COMMUNITY)
Admission: RE | Admit: 2017-04-24 | Discharge: 2017-04-24 | Disposition: A | Discharge status: Home / Self Care | Payer: Managed Care, Other (non HMO) | Source: Ambulatory Visit | Attending: Cardiology | Admitting: Cardiology

## 2017-04-24 DIAGNOSIS — I214 Non-ST elevation (NSTEMI) myocardial infarction: Secondary | ICD-10-CM

## 2017-04-24 DIAGNOSIS — Z951 Presence of aortocoronary bypass graft: Secondary | ICD-10-CM

## 2017-04-27 ENCOUNTER — Ambulatory Visit (HOSPITAL_COMMUNITY): Payer: Managed Care, Other (non HMO)

## 2017-04-27 ENCOUNTER — Encounter: Payer: Self-pay | Admitting: Cardiology

## 2017-04-27 ENCOUNTER — Encounter (HOSPITAL_COMMUNITY)
Admission: RE | Admit: 2017-04-27 | Discharge: 2017-04-27 | Disposition: A | Discharge status: Home / Self Care | Payer: Managed Care, Other (non HMO) | Source: Ambulatory Visit | Attending: Cardiology | Admitting: Cardiology

## 2017-04-27 DIAGNOSIS — M1990 Unspecified osteoarthritis, unspecified site: Secondary | ICD-10-CM | POA: Insufficient documentation

## 2017-04-27 DIAGNOSIS — I2510 Atherosclerotic heart disease of native coronary artery without angina pectoris: Secondary | ICD-10-CM | POA: Insufficient documentation

## 2017-04-27 DIAGNOSIS — I10 Essential (primary) hypertension: Secondary | ICD-10-CM | POA: Diagnosis not present

## 2017-04-27 DIAGNOSIS — I214 Non-ST elevation (NSTEMI) myocardial infarction: Secondary | ICD-10-CM | POA: Insufficient documentation

## 2017-04-27 DIAGNOSIS — E7800 Pure hypercholesterolemia, unspecified: Secondary | ICD-10-CM | POA: Diagnosis not present

## 2017-04-27 DIAGNOSIS — K219 Gastro-esophageal reflux disease without esophagitis: Secondary | ICD-10-CM | POA: Insufficient documentation

## 2017-04-27 DIAGNOSIS — Z79899 Other long term (current) drug therapy: Secondary | ICD-10-CM | POA: Diagnosis not present

## 2017-04-27 DIAGNOSIS — Z7902 Long term (current) use of antithrombotics/antiplatelets: Secondary | ICD-10-CM | POA: Diagnosis not present

## 2017-04-27 DIAGNOSIS — Z951 Presence of aortocoronary bypass graft: Secondary | ICD-10-CM | POA: Insufficient documentation

## 2017-04-27 DIAGNOSIS — Z7982 Long term (current) use of aspirin: Secondary | ICD-10-CM | POA: Diagnosis not present

## 2017-04-28 ENCOUNTER — Ambulatory Visit: Payer: Managed Care, Other (non HMO) | Admitting: Cardiology

## 2017-04-29 ENCOUNTER — Encounter (HOSPITAL_COMMUNITY)
Admission: RE | Admit: 2017-04-29 | Discharge: 2017-04-29 | Disposition: A | Discharge status: Home / Self Care | Payer: Managed Care, Other (non HMO) | Source: Ambulatory Visit | Attending: Cardiology | Admitting: Cardiology

## 2017-04-29 ENCOUNTER — Ambulatory Visit (HOSPITAL_COMMUNITY): Payer: Managed Care, Other (non HMO)

## 2017-04-29 DIAGNOSIS — I214 Non-ST elevation (NSTEMI) myocardial infarction: Secondary | ICD-10-CM

## 2017-04-29 DIAGNOSIS — Z951 Presence of aortocoronary bypass graft: Secondary | ICD-10-CM

## 2017-05-01 ENCOUNTER — Encounter (HOSPITAL_COMMUNITY)
Admission: RE | Admit: 2017-05-01 | Discharge: 2017-05-01 | Disposition: A | Discharge status: Home / Self Care | Payer: Managed Care, Other (non HMO) | Source: Ambulatory Visit | Attending: Cardiology | Admitting: Cardiology

## 2017-05-01 ENCOUNTER — Ambulatory Visit (HOSPITAL_COMMUNITY): Payer: Managed Care, Other (non HMO)

## 2017-05-01 DIAGNOSIS — I214 Non-ST elevation (NSTEMI) myocardial infarction: Secondary | ICD-10-CM | POA: Diagnosis not present

## 2017-05-01 DIAGNOSIS — Z951 Presence of aortocoronary bypass graft: Secondary | ICD-10-CM

## 2017-05-04 ENCOUNTER — Ambulatory Visit (HOSPITAL_COMMUNITY): Payer: Managed Care, Other (non HMO)

## 2017-05-04 ENCOUNTER — Encounter (HOSPITAL_COMMUNITY)
Admission: RE | Admit: 2017-05-04 | Discharge: 2017-05-04 | Disposition: A | Discharge status: Home / Self Care | Payer: Managed Care, Other (non HMO) | Source: Ambulatory Visit | Attending: Cardiology | Admitting: Cardiology

## 2017-05-04 DIAGNOSIS — I214 Non-ST elevation (NSTEMI) myocardial infarction: Secondary | ICD-10-CM

## 2017-05-04 DIAGNOSIS — Z951 Presence of aortocoronary bypass graft: Secondary | ICD-10-CM

## 2017-05-04 NOTE — Progress Notes (Signed)
Cardiology Office Note    Date:  05/05/2017   ID:  Logan Wells, DOB 08-Feb-1955, MRN [id number]  PCP:  Kathyrn Lass, MD  Cardiologist:  Dr. Martinique  Chief Complaint  Patient presents with  . Follow-up  . Coronary Artery Disease    History of Present Illness:  Logan Wells is a 62 y.o. male with PMH of HTN, HLD and CAD s/p CABG. patient was initially referred to Dr. Radford Pax by his PCP for evaluation of chest pain.  He had a normal ETT and was set up for coronary CTA in December 2018.  This showed a coronary artery calcium score of 1562 Agaston units and a severe stenosis in the proximal to mid LAD and left PDA.  Possible moderate stenosis in the nondominant RCA.  Patient eventually underwent cardiac catheterization and was found to have severe multivessel disease with normal EF and underwent CABG x4 by Dr. Prescott Gum on 01/06/2017 with LIMA to LAD, SVG to diagonal, SVG to OM and SVG to PDB of distal LCx.  Postoperative recovery was fairly uneventful without any arrhythmia.  He was placed on aspirin, beta-blocker, statin therapy.    Unfortunately patient went back to the hospital on 03/09/2017 with recurrent chest pain.  His d-dimer was positive at 0.91.  Serial troponin negative.  TSH normal.  He eventually underwent a relook cardiac catheterization on 03/09/2017 which showed bypass graft failure with occlusion of SVG to PDA and SVG to OM1, patent SVG to diagonal, patent but  atretic appearing LIMA to the mid LAD.  90% mid LAD lesion, 50-60% ostial OM 2 lesion, 80-90% left PDA lesion, nondominant RCA with proximal 95% disease and the mid 90% disease.  Medical therapy was recommended.    30 mg daily of Imdur was added. He is on high dose Crestor now and Zetia added this week.   On follow up today he reports he is doing very well without problems. He denies any chest pain, SOB, palpitations or edema. Tolerating medication well. Is participating in [place] doing high intensity but feels this is not  as much as he is doing outside of Rehab.    Past Medical History:  Diagnosis Date  . Arthritis    "left shoulder" (03/09/2017)  . CAD in native artery 12/2016  . Chest pain 11/19/2016  . GERD (gastroesophageal reflux disease)   . High cholesterol   . Hypertension   . NSTEMI (non-ST elevated myocardial infarction) ([place]) 12/2016; 03/09/2017    Past Surgical History:  Procedure Laterality Date  . CARDIAC CATHETERIZATION  01/05/2017; 03/09/2017  . CORONARY ARTERY BYPASS GRAFT N/A 01/06/2017   Procedure: CORONARY ARTERY BYPASS GRAFTING (CABG) x4 , USING LEFT INTERNAL MAMMARY ARTERY AND RIGHT GREATER SAPHENOUS VEIN HARVESTED ENDOSCOPICALLY;  Surgeon: Ivin Poot, MD;  Location: [facility];  Service: Open Heart Surgery;  Laterality: N/A;  . LEFT HEART CATH AND CORONARY ANGIOGRAPHY N/A 01/05/2017   Procedure: LEFT HEART CATH AND CORONARY ANGIOGRAPHY;  Surgeon: Martinique, Israella Hubert M, MD;  Location: [facility];  Service: Cardiovascular;  Laterality: N/A;  . LEFT HEART CATH AND CORS/GRAFTS ANGIOGRAPHY N/A 03/09/2017   Procedure: LEFT HEART CATH AND CORS/GRAFTS ANGIOGRAPHY;  Surgeon: Belva Crome, MD;  Location: [facility];  Service: Cardiovascular;  Laterality: N/A;  . SHOULDER HEMI-ARTHROPLASTY Left    "partial shoulder replacement"  . TEE WITHOUT CARDIOVERSION N/A 01/06/2017   Procedure: TRANSESOPHAGEAL ECHOCARDIOGRAM (TEE);  Surgeon: Prescott Gum, Collier Salina, MD;  Location: [facility];  Service: Open Heart  Surgery;  Laterality: N/A;  . ULTRASOUND GUIDANCE FOR VASCULAR ACCESS  03/09/2017   Procedure: Ultrasound Guidance For Vascular Access;  Surgeon: Belva Crome, MD;  Location: [facility];  Service: Cardiovascular;;    Current Medications: Outpatient Medications Prior to Visit  Medication Sig Dispense Refill  . amLODipine (NORVASC) 2.5 MG tablet Take 1 tablet (2.5 mg total) by mouth daily. 90 tablet 3  . aspirin 81 MG chewable tablet Chew 1 tablet (81 mg total) by mouth daily.    .  calcium carbonate (TUMS - DOSED IN MG ELEMENTAL CALCIUM) 500 MG chewable tablet Chew 1 tablet by mouth daily as needed for indigestion or heartburn.    . cetirizine (ZYRTEC) 10 MG tablet Take 1 tablet by mouth daily.    . clopidogrel (PLAVIX) 75 MG tablet Take 1 tablet (75 mg total) by mouth daily. 30 tablet 2  . ezetimibe (ZETIA) 10 MG tablet Take 1 tablet by mouth daily.    . isosorbide mononitrate (IMDUR) 60 MG 24 hr tablet Take 1 tablet (60 mg total) by mouth daily. 90 tablet 3  . metoprolol tartrate (LOPRESSOR) 25 MG tablet Take 1 tablet (25 mg total) by mouth 2 (two) times daily. 180 tablet 0  . pantoprazole (PROTONIX) 40 MG tablet Take 1 tablet (40 mg total) by mouth daily. 30 tablet 1  . rosuvastatin (CRESTOR) 40 MG tablet Take 40 mg by mouth daily.     No facility-administered medications prior to visit.      Allergies:   Lipitor [atorvastatin calcium]   Social History   Socioeconomic History  . Marital status: Married    Spouse name: Not on file  . Number of children: Not on file  . Years of education: Not on file  . Highest education level: Not on file  Occupational History  . Not on file  Social Needs  . Financial resource strain: Not on file  . Food insecurity:    Worry: Not on file    Inability: Not on file  . Transportation needs:    Medical: Not on file    Non-medical: Not on file  Tobacco Use  . Smoking status: Never Smoker  . Smokeless tobacco: Never Used  Substance and Sexual Activity  . Alcohol use: Yes    Alcohol/week: 8.4 oz    Types: 14 Glasses of wine per week    Comment: daily  . Drug use: No  . Sexual activity: Not on file  Lifestyle  . Physical activity:    Days per week: Not on file    Minutes per session: Not on file  . Stress: Not on file  Relationships  . Social connections:    Talks on phone: Not on file    Gets together: Not on file    Attends religious service: Not on file    Active member of club or organization: Not on file     Attends meetings of clubs or organizations: Not on file    Relationship status: Not on file  Other Topics Concern  . Not on file  Social History Narrative  . Not on file     Family History:  The patient's family history includes CAD in his mother; Heart attack (age of onset: 76) in his mother; Heart disease in his mother; Multiple myeloma in his father.   ROS:   Please see the history of present illness.    ROS All other systems reviewed and are negative.   PHYSICAL EXAM:  VS:  BP 124/82   Pulse 66   Ht 5' 10"  (1.778 m)   Wt 175 lb 6.4 oz (79.6 kg)   BMI 25.17 kg/m    GENERAL:  Well appearing HEENT:  PERRL, EOMI, sclera are clear. Oropharynx is clear. NECK:  No jugular venous distention, carotid upstroke brisk and symmetric, no bruits, no thyromegaly or adenopathy LUNGS:  Clear to auscultation bilaterally CHEST:  Unremarkable HEART:  RRR,  PMI not displaced or sustained,S1 and S2 within normal limits, no S3, no S4: no clicks, no rubs, no murmurs ABD:  Soft, nontender. BS +, no masses or bruits. No hepatomegaly, no splenomegaly EXT:  2 + pulses throughout, no edema, no cyanosis no clubbing SKIN:  Warm and dry.  No rashes NEURO:  Alert and oriented x 3. Cranial nerves II through XII intact. PSYCH:  Cognitively intact    Wt Readings from Last 3 Encounters:  05/05/17 175 lb 6.4 oz (79.6 kg)  04/01/17 172 lb (78 kg)  03/31/17 172 lb 9.9 oz (78.3 kg)      Studies/Labs Reviewed:   EKG:  EKG is ordered today.  The ekg ordered today demonstrates normal sinus rhythm, Normal Ecg.  I have personally reviewed and interpreted this study.   Recent Labs: 03/09/2017: Magnesium 1.9; TSH 1.267 03/10/2017: BUN 10; Creatinine, Ser 0.83; Hemoglobin 13.7; Platelets 226; Potassium 3.8; Sodium 141 04/22/2017: ALT 37   Lipid Panel    Component Value Date/Time   CHOL 216 (H) 04/22/2017 0817   TRIG 115 04/22/2017 0817   HDL 71 04/22/2017 0817   CHOLHDL 3.0 04/22/2017 0817   CHOLHDL 2.7  01/05/2017 0808   VLDL 16 01/05/2017 0808   LDLCALC 122 (H) 04/22/2017 0817    Additional studies/ records that were reviewed today include:   Cath 03/09/2017 Conclusion    Bypass graft failure with occlusion of the SVG to the PDA and SVG to the first obtuse marginal.   Patent SVG to diagonal.  Patent although atretic appearing LIMA to the mid LAD.   Normal left main.  Total occlusion of the proximal to mid LAD.  90% ostial LAD obstruction.  50-60% obstruction in the ostial to proximal second obtuse marginal which is the dominant obtuse marginal previously bypassed.  Severe diffusely diseased distal circumflex into the left PDA between 80 and 95% throughout the involved region.  Nondominant right coronary with proximal 95% obstruction and mid 90% obstruction after the first acute marginal branch  Normal left ventricular function.  Normal left ventricular end-diastolic pressure.  RECOMMENDATIONS:   Medical therapy.       ASSESSMENT:    1. Coronary artery disease involving coronary bypass graft of native heart with angina pectoris ([place])   2. Essential hypertension   3. Hyperlipidemia, unspecified hyperlipidemia type   4. S/P CABG x 4      PLAN:  In order of problems listed above:   1. CAD s/p CABG: Found to have multivessel disease on coronary CT and underwent bypass surgery, unfortunately repeat cardiac catheterization showed 2 out of 4 bypass grafts with early occlusion.  The LIMA to the LAD and SVG to diagonal are patent. One SVG went to a nondominant RCA. The other SVG went to the left PDA. He is currently not having angina on good medical therapy. If he were to have ongoing angina we could consider PCI of the distal Lcx prior to the PDA. For now continue current therapy. I will follow up in 6 months.  2. Hypertension: well controlled.  3. Hyperlipidemia: LDL still 122 on high dose statin. Zetia added. Will repeat fasting labs in 2 months. If still not at goal  consider adding PCSK 9 inhibitor.     Medication Adjustments/Labs and Tests Ordered: Current medicines are reviewed at length with the patient today.  Concerns regarding medicines are outlined above.  Medication changes, Labs and Tests ordered today are listed in the Patient Instructions below. Patient Instructions  Continue your current therapy. No restrictions on exercise now.  We will repeat your lipids in 2 months  I will see you in 6 months.     Signed, Azra Abrell Martinique, MD  05/05/2017 7:58 AM    [facility]

## 2017-05-05 ENCOUNTER — Ambulatory Visit: Payer: Managed Care, Other (non HMO) | Admitting: Cardiology

## 2017-05-05 ENCOUNTER — Ambulatory Visit (HOSPITAL_COMMUNITY): Payer: Managed Care, Other (non HMO)

## 2017-05-05 ENCOUNTER — Encounter: Payer: Self-pay | Admitting: Cardiology

## 2017-05-05 VITALS — BP 124/82 | HR 66 | Ht 70.0 in | Wt 175.4 lb

## 2017-05-05 DIAGNOSIS — I25709 Atherosclerosis of coronary artery bypass graft(s), unspecified, with unspecified angina pectoris: Secondary | ICD-10-CM

## 2017-05-05 DIAGNOSIS — I10 Essential (primary) hypertension: Secondary | ICD-10-CM

## 2017-05-05 DIAGNOSIS — E785 Hyperlipidemia, unspecified: Secondary | ICD-10-CM

## 2017-05-05 DIAGNOSIS — Z951 Presence of aortocoronary bypass graft: Secondary | ICD-10-CM

## 2017-05-05 NOTE — Patient Instructions (Addendum)
Continue your current therapy. No restrictions on exercise now.  We will repeat your lipids in 2 months  I will see you in 6 months.

## 2017-05-06 ENCOUNTER — Ambulatory Visit (HOSPITAL_COMMUNITY): Payer: Managed Care, Other (non HMO)

## 2017-05-06 ENCOUNTER — Encounter (HOSPITAL_COMMUNITY)
Admission: RE | Admit: 2017-05-06 | Discharge: 2017-05-06 | Disposition: A | Discharge status: Home / Self Care | Payer: Managed Care, Other (non HMO) | Source: Ambulatory Visit | Attending: Cardiology | Admitting: Cardiology

## 2017-05-06 VITALS — Ht 70.25 in | Wt 171.5 lb

## 2017-05-06 DIAGNOSIS — I214 Non-ST elevation (NSTEMI) myocardial infarction: Secondary | ICD-10-CM | POA: Diagnosis not present

## 2017-05-06 DIAGNOSIS — Z951 Presence of aortocoronary bypass graft: Secondary | ICD-10-CM

## 2017-05-06 NOTE — Progress Notes (Signed)
Discharge Progress Report  Patient Details  Name: Logan Wells MRN: [id number] Date of Birth: 1955-07-15 Referring Provider:     CARDIAC REHAB PHASE II ORIENTATION from 03/31/2017 in [place]  Referring Provider  Martinique, Peter, MD.       Number of Visits: 15  Reason for Discharge:  Early Exit:  Back to work  Smoking History:  Social History   Tobacco Use  Smoking Status Never Smoker  Smokeless Tobacco Never Used    Diagnosis:  NSTEMI (non-ST elevated myocardial infarction) ([place])  S/P CABG x 4  ADL [facility]:   Initial Exercise Prescription: Initial Exercise Prescription - 03/31/17 1500      Date of Initial Exercise RX and Referring Provider   Date  03/31/17    Referring Provider  Martinique, Peter, MD.      Treadmill   MPH  3    Grade  0    Minutes  10    METs  3.3      Bike   Level  1.4    Minutes  10    METs  4.41      NuStep   Level  4    SPM  85    Minutes  10    METs  3      Prescription Details   Frequency (times per week)  3    Duration  Progress to 30 minutes of continuous aerobic without signs/symptoms of physical distress      Intensity   THRR 40-80% of Max Heartrate  64-129    Ratings of Perceived Exertion  11-13    Perceived Dyspnea  0-4      Progression   Progression  Continue to progress workloads to maintain intensity without signs/symptoms of physical distress.      Resistance Training   Training Prescription  Yes    Weight  4lbs    Reps  10-15       Discharge Exercise Prescription (Final Exercise Prescription Changes): Exercise Prescription Changes - 05/06/17 1400      Response to Exercise   Blood Pressure (Admit)  118/80    Blood Pressure (Exercise)  160/84    Blood Pressure (Exit)  120/80    Heart Rate (Admit)  92 bpm    Heart Rate (Exercise)  156 bpm    Heart Rate (Exit)  84 bpm    Rating of Perceived Exertion (Exercise)  16    Perceived Dyspnea (Exercise)  0    Symptoms  None     Comments  Pt's workout cut short due to exit paper work    Duration  Continue with 45 min of aerobic exercise without signs/symptoms of physical distress.    Intensity  THRR unchanged      Progression   Progression  Continue to progress workloads to maintain intensity without signs/symptoms of physical distress.    Average METs  7.5      Resistance Training   Training Prescription  No      Bike   Level  3.2 HIIT Level at 5.2    Minutes  10    METs  9.7      Home Exercise Plan   Plans to continue exercise at  [facility] (comment) [facility] [employer]     Frequency  Add 2 additional days to program exercise sessions.    Initial Home Exercises Provided  04/15/17       Functional Capacity: 6 Minute Walk    Row  Name 03/31/17 1352 05/06/17 1511       6 Minute Walk   Phase  Initial  Discharge    Distance  1829 feet  2143 feet    Distance % Change  -  17.7 %    Distance Feet Change  -  3.14 ft    Walk Time  6 minutes  6 minutes    # of Rest Breaks  0  0    MPH  3.46  4.06    METS  4.43  5.43    RPE  9  12    Perceived Dyspnea   -  0    VO2 Peak  15.49  19.01    Symptoms  No  No    Resting HR  71 bpm  92 bpm    Resting BP  124/74  118/80    Resting Oxygen Saturation   97 %  -    Exercise Oxygen Saturation  during 6 min walk  97 %  -    Max Ex. HR  80 bpm  121 bpm    Max Ex. BP  124/68  132/82    2 Minute Post BP  118/62  120/80       Psychological, QOL, Others - Outcomes: PHQ 2/9: Depression screen [facility] 2/9 05/06/2017 04/06/2017  Decreased Interest 0 0  Down, Depressed, Hopeless 0 0  PHQ - 2 Score 0 0    Quality of Life: Quality of Life - 05/06/17 1500      Quality of Life Scores   Health/Function Pre  24.17 %    Health/Function Post  23.8 %    Health/Function % Change  -1.53 %    Socioeconomic Pre  22.21 %    Socioeconomic Post  24.65 %    Socioeconomic % Change   10.99 %    Psych/Spiritual Pre  23.07 %    Psych/Spiritual Post  23.79 %    Psych/Spiritual %  Change  3.12 %    Family Pre  24 %    Family Post  26.4 %    Family % Change  10 %    GLOBAL Pre  23.5 %    GLOBAL Post  24.35 %    GLOBAL % Change  3.62 %       Personal Goals: Goals established at orientation with interventions provided to work toward goal. Personal Goals and Risk Factors at Admission - 03/31/17 1439      Core Components/Risk Factors/Patient Goals on Admission   Hypertension  Yes    Intervention  Provide education on lifestyle modifcations including regular physical activity/exercise, weight management, moderate sodium restriction and increased consumption of fresh fruit, vegetables, and low fat dairy, alcohol moderation, and smoking cessation.;Monitor prescription use compliance.    Expected Outcomes  Short Term: Continued assessment and intervention until BP is < 140/31m HG in hypertensive participants. < 130/864mHG in hypertensive participants with diabetes, heart failure or chronic kidney disease.;Long Term: Maintenance of blood pressure at goal levels.    Lipids  Yes    Intervention  Provide education and support for participant on nutrition & aerobic/resistive exercise along with prescribed medications to achieve LDL <7060mHDL >84m52m  Expected Outcomes  Short Term: Participant states understanding of desired cholesterol values and is compliant with medications prescribed. Participant is following exercise prescription and nutrition guidelines.;Long Term: Cholesterol controlled with medications as prescribed, with individualized exercise RX and with personalized nutrition plan. Value goals: LDL < 70mg64mL >  40 mg.        Personal Goals Discharge: Goals and Risk Factor Review    Row Name 05/06/17 1215 05/06/17 1217           Core Components/Risk Factors/Patient Goals Review   Personal Goals Review  Weight Management/Obesity  Weight Management/Obesity;Lipids;Hypertension      Review  -  Jadis has maintained his weight. Seneca's vital signs have been stable  at cardiac rehab      Expected Outcomes  Sigmund will continue to take his medications for HTN and hyperlipidemia as presribed and exercise on his own upon discharge from cardiac rehab  -         Exercise Goals and Review: Exercise Goals    Row Name 03/31/17 1402             Exercise Goals   Increase Physical Activity  Yes       Intervention  Provide advice, education, support and counseling about physical activity/exercise needs.;Develop an individualized exercise prescription for aerobic and resistive training based on initial evaluation findings, risk stratification, comorbidities and participant's personal goals.       Expected Outcomes  Short Term: Attend rehab on a regular basis to increase amount of physical activity.;Long Term: Exercising regularly at least 3-5 days a week.;Long Term: Add in home exercise to make exercise part of routine and to increase amount of physical activity.       Increase Strength and Stamina  Yes       Intervention  Provide advice, education, support and counseling about physical activity/exercise needs.;Develop an individualized exercise prescription for aerobic and resistive training based on initial evaluation findings, risk stratification, comorbidities and participant's personal goals.       Expected Outcomes  Short Term: Increase workloads from initial exercise prescription for resistance, speed, and METs.;Short Term: Perform resistance training exercises routinely during rehab and add in resistance training at home;Long Term: Improve cardiorespiratory fitness, muscular endurance and strength as measured by increased METs and functional capacity (6MWT)       Able to understand and use rate of perceived exertion (RPE) scale  Yes       Intervention  Provide education and explanation on how to use RPE scale       Expected Outcomes  Short Term: Able to use RPE daily in rehab to express subjective intensity level;Long Term:  Able to use RPE to guide intensity  level when exercising independently       Knowledge and understanding of Target Heart Rate Range (THRR)  Yes       Intervention  Provide education and explanation of THRR including how the numbers were predicted and where they are located for reference       Expected Outcomes  Short Term: Able to state/look up THRR;Long Term: Able to use THRR to govern intensity when exercising independently;Short Term: Able to use daily as guideline for intensity in rehab       Able to check pulse independently  Yes       Intervention  Provide education and demonstration on how to check pulse in carotid and radial arteries.;Review the importance of being able to check your own pulse for safety during independent exercise       Expected Outcomes  Short Term: Able to explain why pulse checking is important during independent exercise;Long Term: Able to check pulse independently and accurately       Understanding of Exercise Prescription  Yes       Intervention  Provide education, explanation, and written materials on patient's individual exercise prescription       Expected Outcomes  Short Term: Able to explain program exercise prescription;Long Term: Able to explain home exercise prescription to exercise independently          Nutrition & Weight - Outcomes: Pre Biometrics - 03/31/17 1403      Pre Biometrics   Height  5' 10.25" (1.784 m)    Weight  172 lb 9.9 oz (78.3 kg)    Waist Circumference  36.5 inches    Hip Circumference  38.5 inches    Waist to Hip Ratio  0.95 %    BMI (Calculated)  24.6    Triceps Skinfold  4 mm    % Body Fat  19.1 %    Grip Strength  46.5 kg    Flexibility  12 in    Single Leg Stand  22 seconds      Post Biometrics - 05/06/17 1510       Post  Biometrics   Height  5' 10.25" (1.784 m)    Weight  171 lb 8.3 oz (77.8 kg)    Waist Circumference  35.5 inches    Hip Circumference  36.5 inches    Waist to Hip Ratio  0.97 %    BMI (Calculated)  24.44    Triceps Skinfold  4 mm     % Body Fat  18.5 %    Grip Strength  49 kg    Flexibility  13 in    Single Leg Stand  23 seconds       Nutrition: Nutrition Therapy & Goals - 03/31/17 1331      Nutrition Therapy   Diet  Heart Healthy      Personal Nutrition Goals   Nutrition Goal  Pt to identify and limit food sources of saturated fat, trans fat, and sodium      Intervention Plan   Intervention  Prescribe, educate and counsel regarding individualized specific dietary modifications aiming towards targeted core components such as weight, hypertension, lipid management, diabetes, heart failure and other comorbidities.    Expected Outcomes  Short Term Goal: Understand basic principles of dietary content, such as calories, fat, sodium, cholesterol and nutrients.;Long Term Goal: Adherence to prescribed nutrition plan.       Nutrition Discharge: Nutrition Assessments - 05/08/17 1338      MEDFICTS Scores   Pre Score  42    Post Score  9    Score Difference  -33       Education Questionnaire Score: Knowledge Questionnaire Score - 05/06/17 1510      Knowledge Questionnaire Score   Pre Score  17/24    Post Score  22/24       Goals reviewed with patient; copy given to patient.Pt graduated from cardiac rehab program today with completion of 15 exercise sessions in Phase II. Pt maintained good attendance and progressed nicely during his participation in rehab as evidenced by increased MET level.   Medication list reconciled. Repeat  PHQ score-0  .  Pt has made significant lifestyle changes and should be commended for his success. Pt feels he has achieved his goals during cardiac rehab.   Pt plans to continue exercise at the gym near his house. Sacha increased his distance on his post exercise walk test and maintained his weight.Barnet Pall, RN,BSN 05/14/2017 3:53 PM

## 2017-05-08 ENCOUNTER — Ambulatory Visit (HOSPITAL_COMMUNITY): Payer: Managed Care, Other (non HMO)

## 2017-05-08 ENCOUNTER — Encounter (HOSPITAL_COMMUNITY): Payer: Managed Care, Other (non HMO)

## 2017-05-11 ENCOUNTER — Encounter (HOSPITAL_COMMUNITY): Payer: Managed Care, Other (non HMO)

## 2017-05-11 ENCOUNTER — Ambulatory Visit (HOSPITAL_COMMUNITY): Payer: Managed Care, Other (non HMO)

## 2017-05-13 ENCOUNTER — Encounter (HOSPITAL_COMMUNITY): Payer: Managed Care, Other (non HMO)

## 2017-05-13 ENCOUNTER — Ambulatory Visit (HOSPITAL_COMMUNITY): Payer: Managed Care, Other (non HMO)

## 2017-05-15 ENCOUNTER — Ambulatory Visit (HOSPITAL_COMMUNITY): Payer: Managed Care, Other (non HMO)

## 2017-05-15 ENCOUNTER — Encounter (HOSPITAL_COMMUNITY): Payer: Managed Care, Other (non HMO)

## 2017-05-15 ENCOUNTER — Other Ambulatory Visit: Payer: Self-pay | Admitting: Cardiology

## 2017-05-18 ENCOUNTER — Encounter (HOSPITAL_COMMUNITY): Payer: Managed Care, Other (non HMO)

## 2017-05-18 ENCOUNTER — Ambulatory Visit (HOSPITAL_COMMUNITY): Payer: Managed Care, Other (non HMO)

## 2017-05-19 ENCOUNTER — Other Ambulatory Visit: Payer: Self-pay | Admitting: *Deleted

## 2017-05-19 ENCOUNTER — Other Ambulatory Visit: Payer: Self-pay | Admitting: Cardiology

## 2017-05-19 ENCOUNTER — Encounter: Payer: Self-pay | Admitting: Cardiology

## 2017-05-19 ENCOUNTER — Other Ambulatory Visit: Payer: Self-pay

## 2017-05-19 MED ORDER — ROSUVASTATIN CALCIUM 40 MG PO TABS: 40.0000 mg | ORAL_TABLET | Freq: Every day | ORAL | 6 refills | Status: DC

## 2017-05-19 MED ORDER — METOPROLOL TARTRATE 25 MG PO TABS: 25.0000 mg | ORAL_TABLET | Freq: Two times a day (BID) | ORAL | 1 refills | Status: DC

## 2017-05-19 MED ORDER — PANTOPRAZOLE SODIUM 40 MG PO TBEC: 40.0000 mg | DELAYED_RELEASE_TABLET | Freq: Every day | ORAL | 1 refills | Status: DC

## 2017-05-20 ENCOUNTER — Encounter (HOSPITAL_COMMUNITY): Payer: Managed Care, Other (non HMO)

## 2017-05-20 ENCOUNTER — Ambulatory Visit (HOSPITAL_COMMUNITY): Payer: Managed Care, Other (non HMO)

## 2017-05-22 ENCOUNTER — Encounter (HOSPITAL_COMMUNITY): Payer: Managed Care, Other (non HMO)

## 2017-05-22 ENCOUNTER — Ambulatory Visit (HOSPITAL_COMMUNITY): Payer: Managed Care, Other (non HMO)

## 2017-05-25 ENCOUNTER — Ambulatory Visit (HOSPITAL_COMMUNITY): Payer: Managed Care, Other (non HMO)

## 2017-05-25 ENCOUNTER — Encounter (HOSPITAL_COMMUNITY): Payer: Managed Care, Other (non HMO)

## 2017-05-27 ENCOUNTER — Encounter: Payer: Self-pay | Admitting: Cardiology

## 2017-05-27 ENCOUNTER — Encounter (HOSPITAL_COMMUNITY): Payer: Managed Care, Other (non HMO)

## 2017-05-27 ENCOUNTER — Ambulatory Visit (HOSPITAL_COMMUNITY): Payer: Managed Care, Other (non HMO)

## 2017-05-27 ENCOUNTER — Other Ambulatory Visit: Payer: Self-pay | Admitting: Cardiology

## 2017-05-27 MED ORDER — SCOPOLAMINE 1 MG/3DAYS TD PT72: 1.0000 | MEDICATED_PATCH | TRANSDERMAL | 0 refills | Status: DC

## 2017-05-29 ENCOUNTER — Encounter (HOSPITAL_COMMUNITY): Payer: Managed Care, Other (non HMO)

## 2017-05-29 ENCOUNTER — Ambulatory Visit (HOSPITAL_COMMUNITY): Payer: Managed Care, Other (non HMO)

## 2017-06-01 ENCOUNTER — Encounter (HOSPITAL_COMMUNITY): Payer: Managed Care, Other (non HMO)

## 2017-06-01 ENCOUNTER — Ambulatory Visit (HOSPITAL_COMMUNITY): Payer: Managed Care, Other (non HMO)

## 2017-06-02 ENCOUNTER — Other Ambulatory Visit: Payer: Self-pay

## 2017-06-02 ENCOUNTER — Encounter: Payer: Self-pay | Admitting: Cardiology

## 2017-06-02 MED ORDER — NITROGLYCERIN 0.4 MG SL SUBL: 0.4000 mg | SUBLINGUAL_TABLET | SUBLINGUAL | 11 refills | Status: DC | PRN

## 2017-06-03 ENCOUNTER — Encounter (HOSPITAL_COMMUNITY): Payer: Managed Care, Other (non HMO)

## 2017-06-03 ENCOUNTER — Ambulatory Visit (HOSPITAL_COMMUNITY): Payer: Managed Care, Other (non HMO)

## 2017-06-04 ENCOUNTER — Other Ambulatory Visit: Payer: Self-pay | Admitting: Cardiology

## 2017-06-05 ENCOUNTER — Encounter (HOSPITAL_COMMUNITY): Payer: Managed Care, Other (non HMO)

## 2017-06-05 ENCOUNTER — Ambulatory Visit (HOSPITAL_COMMUNITY): Payer: Managed Care, Other (non HMO)

## 2017-06-08 ENCOUNTER — Encounter (HOSPITAL_COMMUNITY): Payer: Managed Care, Other (non HMO)

## 2017-06-10 ENCOUNTER — Encounter (HOSPITAL_COMMUNITY): Payer: Managed Care, Other (non HMO)

## 2017-06-12 ENCOUNTER — Encounter (HOSPITAL_COMMUNITY): Payer: Managed Care, Other (non HMO)

## 2017-06-15 ENCOUNTER — Encounter (HOSPITAL_COMMUNITY): Payer: Managed Care, Other (non HMO)

## 2017-06-17 ENCOUNTER — Encounter (HOSPITAL_COMMUNITY): Payer: Managed Care, Other (non HMO)

## 2017-06-19 ENCOUNTER — Encounter (HOSPITAL_COMMUNITY): Payer: Managed Care, Other (non HMO)

## 2017-06-24 ENCOUNTER — Encounter (HOSPITAL_COMMUNITY): Payer: Managed Care, Other (non HMO)

## 2017-06-26 ENCOUNTER — Encounter (HOSPITAL_COMMUNITY): Payer: Managed Care, Other (non HMO)

## 2017-06-29 ENCOUNTER — Encounter (HOSPITAL_COMMUNITY): Payer: Managed Care, Other (non HMO)

## 2017-07-01 ENCOUNTER — Encounter (HOSPITAL_COMMUNITY): Payer: Managed Care, Other (non HMO)

## 2017-07-03 ENCOUNTER — Encounter (HOSPITAL_COMMUNITY): Payer: Managed Care, Other (non HMO)

## 2017-07-06 ENCOUNTER — Encounter (HOSPITAL_COMMUNITY): Payer: Managed Care, Other (non HMO)

## 2017-07-07 LAB — LIPID PANEL
CHOL/HDL RATIO: 2.3 ratio (ref 0.0–5.0)
Cholesterol, Total: 147 mg/dL (ref 100–199)
HDL: 65 mg/dL (ref >39)
LDL CALC: 63 mg/dL (ref 0–99)
Triglycerides: 93 mg/dL (ref 0–149)
VLDL Cholesterol Cal: 19 mg/dL (ref 5–40)

## 2017-07-08 ENCOUNTER — Encounter (HOSPITAL_COMMUNITY): Payer: Managed Care, Other (non HMO)

## 2017-07-10 ENCOUNTER — Encounter (HOSPITAL_COMMUNITY): Payer: Managed Care, Other (non HMO)

## 2017-07-13 ENCOUNTER — Encounter (HOSPITAL_COMMUNITY): Payer: Managed Care, Other (non HMO)

## 2017-07-15 ENCOUNTER — Encounter (HOSPITAL_COMMUNITY): Payer: Managed Care, Other (non HMO)

## 2017-07-17 ENCOUNTER — Encounter (HOSPITAL_COMMUNITY): Payer: Managed Care, Other (non HMO)

## 2017-07-20 ENCOUNTER — Encounter: Payer: Self-pay | Admitting: Cardiology

## 2017-07-20 ENCOUNTER — Encounter (HOSPITAL_COMMUNITY): Payer: Managed Care, Other (non HMO)

## 2017-07-21 ENCOUNTER — Encounter: Payer: Self-pay | Admitting: Physician Assistant

## 2017-07-21 ENCOUNTER — Ambulatory Visit: Payer: Managed Care, Other (non HMO) | Admitting: Physician Assistant

## 2017-07-21 VITALS — BP 116/74 | HR 62 | Ht 70.0 in | Wt 173.0 lb

## 2017-07-21 DIAGNOSIS — I25708 Atherosclerosis of coronary artery bypass graft(s), unspecified, with other forms of angina pectoris: Secondary | ICD-10-CM | POA: Diagnosis not present

## 2017-07-21 DIAGNOSIS — I10 Essential (primary) hypertension: Secondary | ICD-10-CM

## 2017-07-21 DIAGNOSIS — E785 Hyperlipidemia, unspecified: Secondary | ICD-10-CM | POA: Diagnosis not present

## 2017-07-21 NOTE — Patient Instructions (Addendum)
Medication Instructions:  Your physician recommends that you continue on your current medications as directed. Please refer to the Current Medication list given to you today.  Labwork: None   Testing/Procedures: Your physician has requested that you have en exercise stress myoview. For further information please visit [email or web address]. Please follow instruction sheet, as given. 1. HOLD Metoprolol the night before and morning of test 2.  No Caffeine 12 hours prior or test 3. Can take a few sips of water the morning of test with other meds   Follow-Up: Your physician recommends that you schedule a follow-up appointment in: October 2019 with Dr Swazil[place]Jordan.   Any Other Special Instructions Will Be Listed Below (If Applicable).  If you need a refill on your cardiac medications before your next appointment, please call your pharmacy.

## 2017-07-21 NOTE — Progress Notes (Signed)
Cardiology Office Note    Date:  07/23/2017   ID:  Logan Wells, DOB August 18, 1955, MRN [id number]  PCP:  Kathyrn Lass, MD  Cardiologist:  Dr. Martinique   Chief Complaint  Patient presents with  . Follow-up    having chest pain since the middle of last week     History of Present Illness:  Logan Wells is a 62 y.o. male with PMH of HTN, HLD and CAD s/p CABG. Patient was initially referred to Dr. Radford Pax by his PCP for evaluation of chest pain.  He had a normal ETT and was set up for coronary CTA in December 2018.  This showed a coronary artery calcium score of 1562 Agaston units and a severe stenosis in the proximal to mid LAD and left PDA.  Possible moderate stenosis in the nondominant RCA.  Patient eventually underwent cardiac catheterization by Dr. Martinique and found to have severe multivessel disease with normal EF and underwent CABG x4 by Dr. Prescott Gum on 01/06/2017 with LIMA to LAD, SVG to diagonal, SVG to OM and SVG to PDA of distal LCx.  Postoperative recovery was fairly uneventful without any arrhythmia.  He was placed on aspirin, beta-blocker, statin therapy.  Patient was seen by Lyda Jester PA-C in the cardiology office on 01/28/2017 for post hospital follow-up.  He was doing well at the time.  Unfortunately patient went back to the hospital on 03/09/2017 with recurrent chest pain.  His d-dimer was positive at 0.91.  Serial troponin negative.  TSH normal.  He eventually underwent a relook cardiac catheterization on 03/09/2017 which showed bypass graft failure with occlusion of SVG to PDA and SVG to OM1, patent SVG to diagonal, patent but atretic appearing LIMA to mid LAD.  90% mid LAD lesion, 50-60% ostial OM 2 lesion, 80-90% left PDA lesion, nondominant RCA with proximal 95% disease and the mid 90% disease.  Medical therapy was recommended.  His Lipitor was decreased to 40 mg daily due to myalgia.  30 mg daily of Imdur was added.  I last saw the patient in February 2019, his chest discomfort  was atypical in nature and it did not occur with exertion.  However given his significant coronary artery disease, I added 2.5 mg daily of amlodipine and increase his Imdur to 60 mg daily.  It appears patient has been established with [facility] on 03/24/2017 with Dr. Dionne Ano.  His Lipitor was switched to Crestor 40 mg daily.  He was last seen by Dr. Martinique and Dr. Posey Pronto in April 2019.  Patient presents today for cardiology evaluation of chest pain.  He has been doing well for quite some time after increasing the Imdur and added amlodipine, however started in the middle of last week, he started noticing very similar substernal chest pressure like what he has felt in the past.  The only difference is the chest pressure does not occur with exertion, he continues to exercise up to a hour doing cardio exercise without generating any chest discomfort or shortness of breath.  It does not come with shortness of breath.  He has not had any lower extremity edema, orthopnea or PND.  He has a upcoming trip to the [place] in late July.  I recommended a treadmill Myoview to assess for significant ischemia before considering any invasive study especially given the atypical presentation of his symptom.   Past Medical History:  Diagnosis Date  . Arthritis    "left shoulder" (03/09/2017)  . CAD  in native artery 12/2016  . Chest pain 11/19/2016  . GERD (gastroesophageal reflux disease)   . High cholesterol   . Hypertension   . NSTEMI (non-ST elevated myocardial infarction) ([place]) 12/2016; 03/09/2017    Past Surgical History:  Procedure Laterality Date  . CARDIAC CATHETERIZATION  01/05/2017; 03/09/2017  . CORONARY ARTERY BYPASS GRAFT N/A 01/06/2017   Procedure: CORONARY ARTERY BYPASS GRAFTING (CABG) x4 , USING LEFT INTERNAL MAMMARY ARTERY AND RIGHT GREATER SAPHENOUS VEIN HARVESTED ENDOSCOPICALLY;  Surgeon: Ivin Poot, MD;  Location: [facility];  Service: Open Heart Surgery;  Laterality:  N/A;  . LEFT HEART CATH AND CORONARY ANGIOGRAPHY N/A 01/05/2017   Procedure: LEFT HEART CATH AND CORONARY ANGIOGRAPHY;  Surgeon: Martinique, Peter M, MD;  Location: [facility];  Service: Cardiovascular;  Laterality: N/A;  . LEFT HEART CATH AND CORS/GRAFTS ANGIOGRAPHY N/A 03/09/2017   Procedure: LEFT HEART CATH AND CORS/GRAFTS ANGIOGRAPHY;  Surgeon: Belva Crome, MD;  Location: [facility];  Service: Cardiovascular;  Laterality: N/A;  . SHOULDER HEMI-ARTHROPLASTY Left    "partial shoulder replacement"  . TEE WITHOUT CARDIOVERSION N/A 01/06/2017   Procedure: TRANSESOPHAGEAL ECHOCARDIOGRAM (TEE);  Surgeon: Prescott Gum, Collier Salina, MD;  Location: [facility];  Service: Open Heart Surgery;  Laterality: N/A;  . ULTRASOUND GUIDANCE FOR VASCULAR ACCESS  03/09/2017   Procedure: Ultrasound Guidance For Vascular Access;  Surgeon: Belva Crome, MD;  Location: [facility];  Service: Cardiovascular;;    Current Medications: Outpatient Medications Prior to Visit  Medication Sig Dispense Refill  . amLODipine (NORVASC) 2.5 MG tablet Take 1 tablet (2.5 mg total) by mouth daily. 90 tablet 3  . aspirin 81 MG chewable tablet Chew 1 tablet (81 mg total) by mouth daily.    . cetirizine (ZYRTEC) 10 MG tablet Take 1 tablet by mouth daily.    . clopidogrel (PLAVIX) 75 MG tablet TAKE 1 TABLET BY MOUTH DAILY 30 tablet 10  . ezetimibe (ZETIA) 10 MG tablet Take 1 tablet by mouth daily.    . isosorbide mononitrate (IMDUR) 60 MG 24 hr tablet Take 1 tablet (60 mg total) by mouth daily. 90 tablet 3  . metoprolol tartrate (LOPRESSOR) 25 MG tablet Take 1 tablet (25 mg total) by mouth 2 (two) times daily. 180 tablet 1  . nitroGLYCERIN (NITROSTAT) 0.4 MG SL tablet Place 1 tablet (0.4 mg total) under the tongue every 5 (five) minutes as needed for chest pain. 25 tablet 11  . pantoprazole (PROTONIX) 40 MG tablet Take 1 tablet (40 mg total) by mouth daily. 90 tablet 1  . rosuvastatin (CRESTOR) 40 MG tablet Take 1 tablet (40  mg total) by mouth daily. 30 tablet 6  . scopolamine (TRANSDERM-SCOP, 1.5 MG,) 1 MG/3DAYS Place 1 patch (1.5 mg total) onto the skin every 3 (three) days. 2 patch 0  . calcium carbonate (TUMS - DOSED IN MG ELEMENTAL CALCIUM) 500 MG chewable tablet Chew 1 tablet by mouth daily as needed for indigestion or heartburn.     No facility-administered medications prior to visit.      Allergies:   Lipitor [atorvastatin calcium]   Social History   Socioeconomic History  . Marital status: Married    Spouse name: Not on file  . Number of children: Not on file  . Years of education: Not on file  . Highest education level: Not on file  Occupational History  . Not on file  Social Needs  . Financial resource strain: Not on file  . Food insecurity:  Worry: Not on file    Inability: Not on file  . Transportation needs:    Medical: Not on file    Non-medical: Not on file  Tobacco Use  . Smoking status: Never Smoker  . Smokeless tobacco: Never Used  Substance and Sexual Activity  . Alcohol use: Yes    Alcohol/week: 8.4 oz    Types: 14 Glasses of wine per week    Comment: daily  . Drug use: No  . Sexual activity: Not on file  Lifestyle  . Physical activity:    Days per week: Not on file    Minutes per session: Not on file  . Stress: Not on file  Relationships  . Social connections:    Talks on phone: Not on file    Gets together: Not on file    Attends religious service: Not on file    Active member of club or organization: Not on file    Attends meetings of clubs or organizations: Not on file    Relationship status: Not on file  Other Topics Concern  . Not on file  Social History Narrative  . Not on file     Family History:  The patient's family history includes CAD in his mother; Heart attack (age of onset: 86) in his mother; Heart disease in his mother; Multiple myeloma in his father.   ROS:   Please see the history of present illness.    ROS All other systems reviewed and  are negative.   PHYSICAL EXAM:   VS:  BP 116/74   Pulse 62   Ht 5' 10"  (1.778 m)   Wt 173 lb (78.5 kg)   BMI 24.82 kg/m    GEN: Well nourished, well developed, in no acute distress  HEENT: normal  Neck: no JVD, carotid bruits, or masses Cardiac: RRR; no murmurs, rubs, or gallops,no edema  Respiratory:  clear to auscultation bilaterally, normal work of breathing GI: soft, nontender, nondistended, + BS MS: no deformity or atrophy  Skin: warm and dry, no rash Neuro:  Alert and Oriented x 3, Strength and sensation are intact Psych: euthymic mood, full affect  Wt Readings from Last 3 Encounters:  07/21/17 173 lb (78.5 kg)  05/06/17 171 lb 8.3 oz (77.8 kg)  05/05/17 175 lb 6.4 oz (79.6 kg)      Studies/Labs Reviewed:   EKG:  EKG is ordered today.  The ekg ordered today demonstrates normal sinus rhythm without significant ST-T wave changes  Recent Labs: 03/09/2017: Magnesium 1.9; TSH 1.267 03/10/2017: BUN 10; Creatinine, Ser 0.83; Hemoglobin 13.7; Platelets 226; Potassium 3.8; Sodium 141 04/22/2017: ALT 37   Lipid Panel    Component Value Date/Time   CHOL 147 07/07/2017 0809   TRIG 93 07/07/2017 0809   HDL 65 07/07/2017 0809   CHOLHDL 2.3 07/07/2017 0809   CHOLHDL 2.7 01/05/2017 0808   VLDL 16 01/05/2017 0808   LDLCALC 63 07/07/2017 0809    Additional studies/ records that were reviewed today include:   Cath 03/09/2017 Conclusion    Bypass graft failure with occlusion of the SVG to the PDA and SVG to the first obtuse marginal.   Patent SVG to diagonal.  Patent although atretic appearing LIMA to the mid LAD.   Normal left main.  Total occlusion of the proximal to mid LAD.  90% ostial LAD obstruction.  50-60% obstruction in the ostial to proximal second obtuse marginal which is the dominant obtuse marginal previously bypassed.  Severe diffusely diseased distal circumflex  into the left PDA between 80 and 95% throughout the involved region.  Nondominant right  coronary with proximal 95% obstruction and mid 90% obstruction after the first acute marginal branch  Normal left ventricular function.  Normal left ventricular end-diastolic pressure.  RECOMMENDATIONS:   Medical therapy.      ASSESSMENT:    1. Coronary artery disease involving coronary bypass graft of native heart with other forms of angina pectoris ([place])   2. Essential hypertension   3. Hyperlipidemia, unspecified hyperlipidemia type      PLAN:  In order of problems listed above:  1. CAD: Last cardiac catheterization was seen for 2019, he had early bypass graft failure of SVG to PDA and SVG to OM1, patent SVG to diagonal and atretic.  LIMA to LAD.  Continue aspirin Plavix.  Recent chest discomfort does not occur with exertion, although it is similar to the previous angina.  I will proceed with treadmill nuclear stress test.  2. Hypertension: Continue Imdur and amlodipine.  Blood pressure well controlled.  If chest discomfort worsen, may increase Imdur to 90 mg daily.  3. Hyperlipidemia: Recent lipid panel obtained 2 weeks ago showed total cholesterol 147, triglyceride 93, HDL 65, LDL 63.  Continue Crestor 40 mg daily    Medication Adjustments/Labs and Tests Ordered: Current medicines are reviewed at length with the patient today.  Concerns regarding medicines are outlined above.  Medication changes, Labs and Tests ordered today are listed in the Patient Instructions below. Patient Instructions  Medication Instructions:  Your physician recommends that you continue on your current medications as directed. Please refer to the Current Medication list given to you today.  Labwork: None   Testing/Procedures: Your physician has requested that you have en exercise stress myoview. For further information please visit [email or web address]. Please follow instruction sheet, as given. 1. HOLD Metoprolol the night before and morning of test 2.  No Caffeine 12 hours prior or  test 3. Can take a few sips of water the morning of test with other meds   Follow-Up: Your physician recommends that you schedule a follow-up appointment in: October 2019 with Dr Martinique.   Any Other Special Instructions Will Be Listed Below (If Applicable).  If you need a refill on your cardiac medications before your next appointment, please call your pharmacy.     Hilbert Corrigan, [place]  07/23/2017 7:33 PM    [facility] [address], [place], [place]  [address] Phone: [phone]; Fax: [phone]

## 2017-07-22 ENCOUNTER — Encounter (HOSPITAL_COMMUNITY): Payer: Managed Care, Other (non HMO)

## 2017-07-22 ENCOUNTER — Telehealth (HOSPITAL_COMMUNITY): Payer: Self-pay

## 2017-07-22 NOTE — Telephone Encounter (Signed)
Encounter complete. 

## 2017-07-23 ENCOUNTER — Encounter: Payer: Self-pay | Admitting: Physician Assistant

## 2017-07-24 ENCOUNTER — Ambulatory Visit (HOSPITAL_COMMUNITY)
Admission: RE | Admit: 2017-07-24 | Discharge: 2017-07-24 | Disposition: A | Discharge status: Home / Self Care | Payer: Managed Care, Other (non HMO) | Source: Ambulatory Visit | Attending: Cardiovascular Disease | Admitting: Cardiovascular Disease

## 2017-07-24 ENCOUNTER — Encounter (HOSPITAL_COMMUNITY): Payer: Managed Care, Other (non HMO)

## 2017-07-24 DIAGNOSIS — I25708 Atherosclerosis of coronary artery bypass graft(s), unspecified, with other forms of angina pectoris: Secondary | ICD-10-CM | POA: Insufficient documentation

## 2017-07-24 LAB — MYOCARDIAL PERFUSION IMAGING
CHL CUP MPHR: 161 {beats}/min
CHL CUP NUCLEAR SSS: 5
CHL RATE OF PERCEIVED EXERTION: 17
CSEPED: 12 min
CSEPEDS: 0 s
CSEPEW: 13.4 METS
LV dias vol: 90 mL (ref 62–150)
LV sys vol: 41 mL
Peak HR: 169 {beats}/min
Percent HR: 104 %
Rest HR: 68 {beats}/min
SDS: 3
SRS: 2
TID: 0.84

## 2017-07-24 MED ORDER — TECHNETIUM TC 99M TETROFOSMIN IV KIT
30.1000 | PACK | Freq: Once | INTRAVENOUS | Status: AC | PRN
  Administered 2017-07-24: 30.1 via INTRAVENOUS
  Filled 2017-07-24: qty 31

## 2017-07-24 MED ORDER — TECHNETIUM TC 99M TETROFOSMIN IV KIT
10.3000 | PACK | Freq: Once | INTRAVENOUS | Status: AC | PRN
  Administered 2017-07-24: 10.3 via INTRAVENOUS
  Filled 2017-07-24: qty 11

## 2017-07-27 ENCOUNTER — Encounter (HOSPITAL_COMMUNITY): Payer: Managed Care, Other (non HMO)

## 2017-07-29 ENCOUNTER — Encounter (HOSPITAL_COMMUNITY): Payer: Managed Care, Other (non HMO)

## 2017-07-31 ENCOUNTER — Encounter (HOSPITAL_COMMUNITY): Payer: Managed Care, Other (non HMO)

## 2017-08-03 ENCOUNTER — Encounter (HOSPITAL_COMMUNITY): Payer: Managed Care, Other (non HMO)

## 2017-08-05 ENCOUNTER — Encounter (HOSPITAL_COMMUNITY): Payer: Managed Care, Other (non HMO)

## 2017-08-07 ENCOUNTER — Encounter (HOSPITAL_COMMUNITY): Payer: Managed Care, Other (non HMO)

## 2017-08-10 ENCOUNTER — Encounter (HOSPITAL_COMMUNITY): Payer: Managed Care, Other (non HMO)

## 2017-08-12 ENCOUNTER — Encounter (HOSPITAL_COMMUNITY): Payer: Managed Care, Other (non HMO)

## 2017-11-18 NOTE — Progress Notes (Signed)
Cardiology Office Note    Date:  11/20/2017   ID:  Logan Wells, DOB Jul 25, 1955, MRN [id number]  PCP:  Kathyrn Lass, MD  Cardiologist:  Dr. Martinique  Chief Complaint  Patient presents with  . Chest Pain  . Coronary Artery Disease    History of Present Illness:  Logan Wells is a 62 y.o. male with PMH of HTN, HLD and CAD s/p CABG. patient was initially referred for evaluation of chest pain.  He had a normal ETT and was set up for coronary CTA in December 2018.  This showed a coronary artery calcium score of 1562 Agaston units and a severe stenosis in the proximal to mid LAD and left PDA.  Possible moderate stenosis in the nondominant RCA.  Patient eventually underwent cardiac catheterization and was found to have severe multivessel disease with normal EF and underwent CABG x4 by Dr. Prescott Gum on 01/06/2017 with LIMA to LAD, SVG to diagonal, SVG to OM and SVG to PDB of distal LCx.  Postoperative recovery was fairly uneventful without any arrhythmia.  He was placed on aspirin, beta-blocker, statin therapy.    Unfortunately patient went back to the hospital on 03/09/2017 with recurrent chest pain.  His d-dimer was positive at 0.91.  Serial troponin negative.  TSH normal.  He eventually underwent a relook cardiac catheterization on 03/09/2017 which showed bypass graft failure with occlusion of SVG to PDA and SVG to OM1, patent SVG to diagonal, patent but  atretic appearing LIMA to the mid LAD.  90% mid LAD lesion, 50-60% ostial OM 2 lesion, 80-90% left PDA lesion, nondominant RCA with proximal 95% disease and the mid 90% disease.  Medical therapy was recommended.    30 mg daily of Imdur was added. He is on high dose Crestor now and Zetia.  He was seen in June by Almyra Deforest PA-C for chest pain. A Myoview was ordered and was normal.   On follow up today he reports he is doing very well. He exercises doing aerobic conditioning on a treadmill or elliptical 5 days a week. When he first starts exercising he  feels some pressure in his chest. This resolves and he keeps on going.  Has not had to use sl Ntg. He denies any  SOB, palpitations or edema. Tolerating medication well. He reports some sinus drainage.   Past Medical History:  Diagnosis Date  . Arthritis    "left shoulder" (03/09/2017)  . CAD in native artery 12/2016  . Chest pain 11/19/2016  . GERD (gastroesophageal reflux disease)   . High cholesterol   . Hypertension   . NSTEMI (non-ST elevated myocardial infarction) ([place]) 12/2016; 03/09/2017    Past Surgical History:  Procedure Laterality Date  . CARDIAC CATHETERIZATION  01/05/2017; 03/09/2017  . CORONARY ARTERY BYPASS GRAFT N/A 01/06/2017   Procedure: CORONARY ARTERY BYPASS GRAFTING (CABG) x4 , USING LEFT INTERNAL MAMMARY ARTERY AND RIGHT GREATER SAPHENOUS VEIN HARVESTED ENDOSCOPICALLY;  Surgeon: Ivin Poot, MD;  Location: [facility];  Service: Open Heart Surgery;  Laterality: N/A;  . LEFT HEART CATH AND CORONARY ANGIOGRAPHY N/A 01/05/2017   Procedure: LEFT HEART CATH AND CORONARY ANGIOGRAPHY;  Surgeon: Martinique, Charnell Peplinski M, MD;  Location: [facility];  Service: Cardiovascular;  Laterality: N/A;  . LEFT HEART CATH AND CORS/GRAFTS ANGIOGRAPHY N/A 03/09/2017   Procedure: LEFT HEART CATH AND CORS/GRAFTS ANGIOGRAPHY;  Surgeon: Belva Crome, MD;  Location: [facility];  Service: Cardiovascular;  Laterality: N/A;  . SHOULDER HEMI-ARTHROPLASTY Left    "  partial shoulder replacement"  . TEE WITHOUT CARDIOVERSION N/A 01/06/2017   Procedure: TRANSESOPHAGEAL ECHOCARDIOGRAM (TEE);  Surgeon: Prescott Gum, Collier Salina, MD;  Location: [facility];  Service: Open Heart Surgery;  Laterality: N/A;  . ULTRASOUND GUIDANCE FOR VASCULAR ACCESS  03/09/2017   Procedure: Ultrasound Guidance For Vascular Access;  Surgeon: Belva Crome, MD;  Location: [facility];  Service: Cardiovascular;;    Current Medications: Outpatient Medications Prior to Visit  Medication Sig Dispense Refill  . amLODipine  (NORVASC) 2.5 MG tablet Take 1 tablet (2.5 mg total) by mouth daily. 90 tablet 3  . aspirin 81 MG chewable tablet Chew 1 tablet (81 mg total) by mouth daily.    . cetirizine (ZYRTEC) 10 MG tablet Take 1 tablet by mouth daily.    . clopidogrel (PLAVIX) 75 MG tablet TAKE 1 TABLET BY MOUTH DAILY 30 tablet 10  . ezetimibe (ZETIA) 10 MG tablet Take 1 tablet by mouth daily.    . isosorbide mononitrate (IMDUR) 60 MG 24 hr tablet Take 1 tablet (60 mg total) by mouth daily. 90 tablet 3  . metoprolol tartrate (LOPRESSOR) 25 MG tablet Take 1 tablet (25 mg total) by mouth 2 (two) times daily. 180 tablet 1  . rosuvastatin (CRESTOR) 40 MG tablet Take 1 tablet (40 mg total) by mouth daily. 30 tablet 6  . scopolamine (TRANSDERM-SCOP, 1.5 MG,) 1 MG/3DAYS Place 1 patch (1.5 mg total) onto the skin every 3 (three) days. 2 patch 0  . nitroGLYCERIN (NITROSTAT) 0.4 MG SL tablet Place 1 tablet (0.4 mg total) under the tongue every 5 (five) minutes as needed for chest pain. 25 tablet 11  . pantoprazole (PROTONIX) 40 MG tablet Take 1 tablet (40 mg total) by mouth daily. (Patient not taking: Reported on 11/20/2017) 90 tablet 1   No facility-administered medications prior to visit.      Allergies:   Lipitor [atorvastatin calcium]   Social History   Socioeconomic History  . Marital status: Married    Spouse name: Not on file  . Number of children: Not on file  . Years of education: Not on file  . Highest education level: Not on file  Occupational History  . Not on file  Social Needs  . Financial resource strain: Not on file  . Food insecurity:    Worry: Not on file    Inability: Not on file  . Transportation needs:    Medical: Not on file    Non-medical: Not on file  Tobacco Use  . Smoking status: Never Smoker  . Smokeless tobacco: Never Used  Substance and Sexual Activity  . Alcohol use: Yes    Alcohol/week: 14.0 standard drinks    Types: 14 Glasses of wine per week    Comment: daily  . Drug use: No    . Sexual activity: Not on file  Lifestyle  . Physical activity:    Days per week: Not on file    Minutes per session: Not on file  . Stress: Not on file  Relationships  . Social connections:    Talks on phone: Not on file    Gets together: Not on file    Attends religious service: Not on file    Active member of club or organization: Not on file    Attends meetings of clubs or organizations: Not on file    Relationship status: Not on file  Other Topics Concern  . Not on file  Social History Narrative  . Not on file  Family History:  The patient's family history includes CAD in his mother; Heart attack (age of onset: 1) in his mother; Heart disease in his mother; Multiple myeloma in his father.   ROS:   Please see the history of present illness.    ROS All other systems reviewed and are negative.   PHYSICAL EXAM:   VS:  BP 116/70   Pulse (!) 59   Ht 5' 10"  (1.778 m)   Wt 173 lb 3.2 oz (78.6 kg)   SpO2 95%   BMI 24.85 kg/m    GENERAL:  Well appearing WM in NAD HEENT:  PERRL, EOMI, sclera are clear. Oropharynx is clear. NECK:  No jugular venous distention, carotid upstroke brisk and symmetric, no bruits, no thyromegaly or adenopathy LUNGS:  Clear to auscultation bilaterally CHEST:  Unremarkable HEART:  RRR,  PMI not displaced or sustained,S1 and S2 within normal limits, no S3, no S4: no clicks, no rubs, no murmurs ABD:  Soft, nontender. BS +, no masses or bruits. No hepatomegaly, no splenomegaly EXT:  2 + pulses throughout, no edema, no cyanosis no clubbing SKIN:  Warm and dry.  No rashes NEURO:  Alert and oriented x 3. Cranial nerves II through XII intact. PSYCH:  Cognitively intact      Wt Readings from Last 3 Encounters:  11/20/17 173 lb 3.2 oz (78.6 kg)  07/24/17 173 lb (78.5 kg)  07/21/17 173 lb (78.5 kg)      Studies/Labs Reviewed:   EKG:  EKG is not ordered today.     Recent Labs: 03/09/2017: Magnesium 1.9; TSH 1.267 03/10/2017: BUN 10;  Creatinine, Ser 0.83; Hemoglobin 13.7; Platelets 226; Potassium 3.8; Sodium 141 04/22/2017: ALT 37   Lipid Panel    Component Value Date/Time   CHOL 147 07/07/2017 0809   TRIG 93 07/07/2017 0809   HDL 65 07/07/2017 0809   CHOLHDL 2.3 07/07/2017 0809   CHOLHDL 2.7 01/05/2017 0808   VLDL 16 01/05/2017 0808   LDLCALC 63 07/07/2017 0809    Additional studies/ records that were reviewed today include:   Cath 03/09/2017 Conclusion    Bypass graft failure with occlusion of the SVG to the PDA and SVG to the first obtuse marginal.   Patent SVG to diagonal.  Patent although atretic appearing LIMA to the mid LAD.   Normal left main.  Total occlusion of the proximal to mid LAD.  90% ostial LAD obstruction.  50-60% obstruction in the ostial to proximal second obtuse marginal which is the dominant obtuse marginal previously bypassed.  Severe diffusely diseased distal circumflex into the left PDA between 80 and 95% throughout the involved region.  Nondominant right coronary with proximal 95% obstruction and mid 90% obstruction after the first acute marginal branch  Normal left ventricular function.  Normal left ventricular end-diastolic pressure.  RECOMMENDATIONS:   Medical therapy.     Myoview 07/24/17: Study Highlights    Nuclear stress EF: 54%. The left ventricular ejection fraction is mildly decreased (45-54%). Visually the ejection fraction appears to be greater than 54%.  The study is normal. No evidence of ischemia. No evidence of previous infarction.  This is a low risk study.     ASSESSMENT:    1. Coronary artery disease involving coronary bypass graft of native heart with other forms of angina pectoris ([place])   2. Essential hypertension   3. Hyperlipidemia, unspecified hyperlipidemia type   4. S/P CABG x 4      PLAN:  In order of problems listed above:  1. CAD s/p CABG: Found to have multivessel disease on coronary CT and underwent bypass surgery,  unfortunately repeat cardiac catheterization showed 2 out of 4 bypass grafts with early occlusion.  The LIMA to the LAD and SVG to diagonal are patent. One SVG went to a very small nondominant RCA. The other SVG went to the left PDA. He was seen in June with some chest pain but Myoview was normal.  I reviewed his prior angiogram again today. The LCx distally into the PDA is really diffusely diseased and small and really not a great target for PCI. He has stable class 1-2 angina. We will continue medical therapy.   2. Hypertension: well controlled.  3. Hyperlipidemia: LDL improved to 63 on high dose statin and Zetia.     Medication Adjustments/Labs and Tests Ordered: Current medicines are reviewed at length with the patient today.  Concerns regarding medicines are outlined above.  Medication changes, Labs and Tests ordered today are listed in the Patient Instructions below. There are no Patient Instructions on file for this visit.   Signed, Catheleen Langhorne Martinique, MD  11/20/2017 8:05 AM    [facility]

## 2017-11-20 ENCOUNTER — Ambulatory Visit: Payer: Managed Care, Other (non HMO) | Admitting: Cardiology

## 2017-11-20 ENCOUNTER — Encounter: Payer: Self-pay | Admitting: Cardiology

## 2017-11-20 VITALS — BP 116/70 | HR 59 | Ht 70.0 in | Wt 173.2 lb

## 2017-11-20 DIAGNOSIS — I10 Essential (primary) hypertension: Secondary | ICD-10-CM

## 2017-11-20 DIAGNOSIS — E785 Hyperlipidemia, unspecified: Secondary | ICD-10-CM | POA: Diagnosis not present

## 2017-11-20 DIAGNOSIS — Z951 Presence of aortocoronary bypass graft: Secondary | ICD-10-CM | POA: Diagnosis not present

## 2017-11-20 DIAGNOSIS — I25708 Atherosclerosis of coronary artery bypass graft(s), unspecified, with other forms of angina pectoris: Secondary | ICD-10-CM | POA: Diagnosis not present

## 2018-02-24 ENCOUNTER — Other Ambulatory Visit: Payer: Self-pay | Admitting: *Deleted

## 2018-02-24 MED ORDER — AMLODIPINE BESYLATE 2.5 MG PO TABS: 2.5000 mg | ORAL_TABLET | Freq: Every day | ORAL | 1 refills | Status: DC

## 2018-02-24 MED ORDER — ISOSORBIDE MONONITRATE ER 60 MG PO TB24: 60.0000 mg | ORAL_TABLET | Freq: Every day | ORAL | 1 refills | Status: DC

## 2018-05-06 ENCOUNTER — Other Ambulatory Visit: Payer: Self-pay | Admitting: Cardiology

## 2018-05-19 ENCOUNTER — Telehealth: Payer: Self-pay

## 2018-05-19 NOTE — Telephone Encounter (Signed)
Virtual Visit Pre-Appointment Phone Call  "(Name), I am calling you today to discuss your upcoming appointment. We are currently trying to limit exposure to the virus that causes COVID-19 by seeing patients at home rather than in the office."  1. "What is the BEST phone number to call the day of the visit?" - [phone]  2. "Do you have or have access to (through a family member/friend) a smartphone with video capability that we can use for your visit?" a. If yes - list this number in appt notes as "cell" (if different from BEST phone #) and list the appointment type as a VIDEO visit in appointment notes  3. Confirm consent - "In the setting of the current Covid19 crisis, you are scheduled for a ( video) visit with your provider on (May 6 ) at (4:20 pm ).  Just as we do with many in-office visits, in order for you to participate in this visit, we must obtain consent.  If you'd like, I can send this to your mychart (if signed up) or email for you to review.  Otherwise, I can obtain your verbal consent now.  All virtual visits are billed to your insurance company just like a normal visit would be.  By agreeing to a virtual visit, we'd like you to understand that the technology does not allow for your provider to perform an examination, and thus may limit your provider's ability to fully assess your condition. If your provider identifies any concerns that need to be evaluated in person, we will make arrangements to do so.  Finally, though the technology is pretty good, we cannot assure that it will always work on either your or our end, and in the setting of a video visit, we may have to convert it to a phone-only visit.  In either situation, we cannot ensure that we have a secure connection.  Are you willing to proceed?" STAFF: Did the patient verbally acknowledge consent to telehealth visit? Document YES/NO here:YES  4. Advise patient to be prepared - "Two hours prior to your appointment, go ahead  and check your blood pressure, pulse, oxygen saturation, and your weight (if you have the equipment to check those) and write them all down. When your visit starts, your provider will ask you for this information. If you have an Apple Watch or Kardia device, please plan to have heart rate information ready on the day of your appointment. Please have a pen and paper handy nearby the day of the visit as well."  5. Give patient instructions for MyChart download to smartphone OR Doximity/Doxy.me as below if video visit (depending on what platform provider is using)  6. Inform patient they will receive a phone call 15 minutes prior to their appointment time (may be from unknown caller ID) so they should be prepared to answer    TELEPHONE CALL NOTE  Logan Wells has been deemed a candidate for a follow-up tele-health visit to limit community exposure during the Covid-19 pandemic. I spoke with the patient via phone to ensure availability of phone/video source, confirm preferred email & phone number, and discuss instructions and expectations.  I reminded Logan Wells to be prepared with any vital sign and/or heart rhythm information that could potentially be obtained via home monitoring, at the time of his visit. I reminded Logan Wells to expect a phone call prior to his visit.  Benjamine Mola, CMA 05/19/2018 1:17 PM   INSTRUCTIONS FOR DOWNLOADING THE MYCHART APP TO SMARTPHONE  -  The patient must first make sure to have activated MyChart and know their login information - If Apple, go to [employer] and type in MyChart in the search bar and download the app. If Android, ask patient to go to [employer] and type in [place] in the search bar and download the app. The app is free but as with any other app downloads, their phone may require them to verify saved payment information or Apple/Android password.  - The patient will need to then log into the app with their MyChart username and password, and  select [facility] as their healthcare provider to link the account. When it is time for your visit, go to the MyChart app, find appointments, and click Begin Video Visit. Be sure to Select Allow for your device to access the Microphone and Camera for your visit. You will then be connected, and your provider will be with you shortly.  **If they have any issues connecting, or need assistance please contact MyChart service desk [phone])**  **If using a computer, in order to ensure the best quality for their visit they will need to use either of the following Internet Browsers: [employer], or Google Chrome**  IF USING DOXIMITY or DOXY.ME - The patient will receive a link just prior to their visit by text.     FULL LENGTH CONSENT FOR TELE-HEALTH VISIT   I hereby voluntarily request, consent and authorize [facility] and its employed or contracted physicians, physician assistants, nurse practitioners or other licensed health care professionals (the Practitioner), to provide me with telemedicine health care services (the "Services") as deemed necessary by the treating Practitioner. I acknowledge and consent to receive the Services by the Practitioner via telemedicine. I understand that the telemedicine visit will involve communicating with the Practitioner through live audiovisual communication technology and the disclosure of certain medical information by electronic transmission. I acknowledge that I have been given the opportunity to request an in-person assessment or other available alternative prior to the telemedicine visit and am voluntarily participating in the telemedicine visit.  I understand that I have the right to withhold or withdraw my consent to the use of telemedicine in the course of my care at any time, without affecting my right to future care or treatment, and that the Practitioner or I may terminate the telemedicine visit at any time. I understand that I have  the right to inspect all information obtained and/or recorded in the course of the telemedicine visit and may receive copies of available information for a reasonable fee.  I understand that some of the potential risks of receiving the Services via telemedicine include:  Marland Kitchen Delay or interruption in medical evaluation due to technological equipment failure or disruption; . Information transmitted may not be sufficient (e.g. poor resolution of images) to allow for appropriate medical decision making by the Practitioner; and/or  . In rare instances, security protocols could fail, causing a breach of personal health information.  Furthermore, I acknowledge that it is my responsibility to provide information about my medical history, conditions and care that is complete and accurate to the best of my ability. I acknowledge that Practitioner's advice, recommendations, and/or decision may be based on factors not within their control, such as incomplete or inaccurate data provided by me or distortions of diagnostic images or specimens that may result from electronic transmissions. I understand that the practice of medicine is not an exact science and that Practitioner makes no warranties or guarantees regarding treatment  outcomes. I acknowledge that I will receive a copy of this consent concurrently upon execution via email to the email address I last provided but may also request a printed copy by calling the office of [facility].    I understand that my insurance will be billed for this visit.   I have read or had this consent read to me. . I understand the contents of this consent, which adequately explains the benefits and risks of the Services being provided via telemedicine.  . I have been provided ample opportunity to ask questions regarding this consent and the Services and have had my questions answered to my satisfaction. . I give my informed consent for the services to be provided through the use  of telemedicine in my medical care  By participating in this telemedicine visit I agree to the above.

## 2018-05-27 NOTE — Progress Notes (Deleted)
{Choose 1 Note Type (Telehealth Visit or Telephone Visit):[phone]}   Evaluation Performed:  Follow-up visit  Date:  05/27/2018   ID:  Logan Wells, DOB 15-Mar-1955, MRN [id number]  {Patient Location:[phone]::"Home"} {Provider Location:[id number]}  PCP:  Kathyrn Lass, MD  Cardiologist:  Peter Martinique MD Electrophysiologist:  None   Chief Complaint: follow up CAD  History of Present Illness:    Logan Wells is a 63 y.o. male with PMH of HTN, HLD and CAD s/p CABG. patient was initially referred for evaluation of chest pain.  He had a normal ETT and was set up for coronary CTA in December 2018.  This showed a coronary artery calcium score of 1562 Agaston units and a severe stenosis in the proximal to mid LAD and left PDA.  Possible moderate stenosis in the nondominant RCA.  Patient eventually underwent cardiac catheterization and was found to have severe multivessel disease with normal EF and underwent CABG x4 by Dr. Prescott Gum on 01/06/2017 with LIMA to LAD, SVG to diagonal, SVG to OM and SVG to PDB of distal LCx.  Postoperative recovery was fairly uneventful without any arrhythmia.  He was placed on aspirin, beta-blocker, statin therapy.    Unfortunately patient went back to the hospital on 03/09/2017 with recurrent chest pain.  His d-dimer was positive at 0.91.  Serial troponin negative.  TSH normal.  He eventually underwent a relook cardiac catheterization on 03/09/2017 which showed bypass graft failure with occlusion of SVG to PDA and SVG to OM1, patent SVG to diagonal, patent but  atretic appearing LIMA to the mid LAD.  90% mid LAD lesion, 50-60% ostial OM 2 lesion, 80-90% left PDA lesion, nondominant RCA with proximal 95% disease and the mid 90% disease.  Medical therapy was recommended.    30 mg daily of Imdur was added. He is on high dose Crestor now and Zetia.  He was seen in June by Almyra Deforest PA-C for chest pain. A Myoview was ordered and was normal.   The patient {does/does not:200015}  have symptoms concerning for COVID-19 infection (fever, chills, cough, or new shortness of breath).    Past Medical History:  Diagnosis Date  . Arthritis    "left shoulder" (03/09/2017)  . CAD in native artery 12/2016  . Chest pain 11/19/2016  . GERD (gastroesophageal reflux disease)   . High cholesterol   . Hypertension   . NSTEMI (non-ST elevated myocardial infarction) ([place]) 12/2016; 03/09/2017   Past Surgical History:  Procedure Laterality Date  . CARDIAC CATHETERIZATION  01/05/2017; 03/09/2017  . CORONARY ARTERY BYPASS GRAFT N/A 01/06/2017   Procedure: CORONARY ARTERY BYPASS GRAFTING (CABG) x4 , USING LEFT INTERNAL MAMMARY ARTERY AND RIGHT GREATER SAPHENOUS VEIN HARVESTED ENDOSCOPICALLY;  Surgeon: Ivin Poot, MD;  Location: [facility];  Service: Open Heart Surgery;  Laterality: N/A;  . LEFT HEART CATH AND CORONARY ANGIOGRAPHY N/A 01/05/2017   Procedure: LEFT HEART CATH AND CORONARY ANGIOGRAPHY;  Surgeon: Martinique, Peter M, MD;  Location: [facility];  Service: Cardiovascular;  Laterality: N/A;  . LEFT HEART CATH AND CORS/GRAFTS ANGIOGRAPHY N/A 03/09/2017   Procedure: LEFT HEART CATH AND CORS/GRAFTS ANGIOGRAPHY;  Surgeon: Belva Crome, MD;  Location: [facility];  Service: Cardiovascular;  Laterality: N/A;  . SHOULDER HEMI-ARTHROPLASTY Left    "partial shoulder replacement"  . TEE WITHOUT CARDIOVERSION N/A 01/06/2017   Procedure: TRANSESOPHAGEAL ECHOCARDIOGRAM (TEE);  Surgeon: Prescott Gum, Collier Salina, MD;  Location: [facility];  Service: Open Heart Surgery;  Laterality: N/A;  . ULTRASOUND GUIDANCE  FOR VASCULAR ACCESS  03/09/2017   Procedure: Ultrasound Guidance For Vascular Access;  Surgeon: Belva Crome, MD;  Location: [facility];  Service: Cardiovascular;;     No outpatient medications have been marked as taking for the 06/02/18 encounter (Appointment) with Martinique, Peter M, MD.     Allergies:   Lipitor [atorvastatin calcium]   Social History   Tobacco Use  . Smoking  status: Never Smoker  . Smokeless tobacco: Never Used  Substance Use Topics  . Alcohol use: Yes    Alcohol/week: 14.0 standard drinks    Types: 14 Glasses of wine per week    Comment: daily  . Drug use: No     Family Hx: The patient's family history includes CAD in his mother; Heart attack (age of onset: 61) in his mother; Heart disease in his mother; Multiple myeloma in his father.  ROS:   Please see the history of present illness.    *** All other systems reviewed and are negative.   Prior CV studies:   The following studies were reviewed today:  Myoview 07/24/17: Study Highlights    Nuclear stress EF: 54%. The left ventricular ejection fraction is mildly decreased (45-54%). Visually the ejection fraction appears to be greater than 54%.  The study is normal. No evidence of ischemia. No evidence of previous infarction.  This is a low risk study.     Labs/Other Tests and Data Reviewed:    EKG:  {EKG/Telemetry Strips [phone]}  Recent Labs: No results found for requested labs within last 8760 hours.   Recent Lipid Panel Lab Results  Component Value Date/Time   CHOL 147 07/07/2017 08:09 AM   TRIG 93 07/07/2017 08:09 AM   HDL 65 07/07/2017 08:09 AM   CHOLHDL 2.3 07/07/2017 08:09 AM   CHOLHDL 2.7 01/05/2017 08:08 AM   LDLCALC 63 07/07/2017 08:09 AM    Wt Readings from Last 3 Encounters:  11/20/17 173 lb 3.2 oz (78.6 kg)  07/24/17 173 lb (78.5 kg)  07/21/17 173 lb (78.5 kg)     Objective:    Vital Signs:  There were no vitals taken for this visit.   {[facility] Virtual Exam (Optional):[id number]::"VITAL SIGNS:  reviewed"}  ASSESSMENT & PLAN:    1. CAD s/p CABG: Found to have multivessel disease on coronary CT and underwent bypass surgery, unfortunately repeat cardiac catheterization showed 2 out of 4 bypass grafts with early occlusion.  The LIMA to the LAD and SVG to diagonal are patent. One SVG went to a very small nondominant RCA. The other SVG  went to the left PDA. He was seen in June with some chest pain but Myoview was normal.  I reviewed his prior angiogram again today. The LCx distally into the PDA is really diffusely diseased and small and really not a great target for PCI. He has stable class 1-2 angina. We will continue medical therapy.   2. Hypertension: well controlled.  3. Hyperlipidemia: LDL improved to 63 on high dose statin and Zetia.     COVID-19 Education: The signs and symptoms of COVID-19 were discussed with the patient and how to seek care for testing (follow up with PCP or arrange E-visit).  ***The importance of social distancing was discussed today.  Time:   Today, I have spent *** minutes with the patient with telehealth technology discussing the above problems.     Medication Adjustments/Labs and Tests Ordered: Current medicines are reviewed at length with the patient today.  Concerns regarding medicines are  outlined above.   Tests Ordered: No orders of the defined types were placed in this encounter.   Medication Changes: No orders of the defined types were placed in this encounter.   Disposition:  Follow up {follow up:15908}  Signed, Peter Martinique, MD  05/27/2018 1:16 PM    [facility]

## 2018-06-01 ENCOUNTER — Ambulatory Visit: Payer: Managed Care, Other (non HMO) | Admitting: Cardiology

## 2018-06-02 ENCOUNTER — Telehealth: Payer: Managed Care, Other (non HMO) | Admitting: Cardiology

## 2018-06-08 ENCOUNTER — Other Ambulatory Visit: Payer: Self-pay

## 2018-06-08 MED ORDER — ROSUVASTATIN CALCIUM 40 MG PO TABS: 40.0000 mg | ORAL_TABLET | Freq: Every day | ORAL | 6 refills | Status: DC

## 2018-06-29 ENCOUNTER — Other Ambulatory Visit: Payer: Self-pay

## 2018-06-29 MED ORDER — METOPROLOL TARTRATE 25 MG PO TABS: 25.0000 mg | ORAL_TABLET | Freq: Two times a day (BID) | ORAL | 2 refills | Status: DC

## 2018-08-04 ENCOUNTER — Other Ambulatory Visit: Payer: Self-pay | Admitting: Cardiology

## 2018-08-13 NOTE — Progress Notes (Signed)
Virtual Visit via Telephone Note   This visit type was conducted due to national recommendations for restrictions regarding the COVID-19 Pandemic (e.g. social distancing) in an effort to limit this patient's exposure and mitigate transmission in our community.  Due to his co-morbid illnesses, this patient is at least at moderate risk for complications without adequate follow up.  This format is felt to be most appropriate for this patient at this time.  The patient did not have access to video technology/had technical difficulties with video requiring transitioning to audio format only (telephone).  All issues noted in this document were discussed and addressed.  No physical exam could be performed with this format.  Please refer to the patient's chart for his  consent to telehealth for [facility].   Date:  08/17/2018   ID:  Logan Wells, DOB 05/23/1955, MRN [id number]  Patient Location: Home Provider Location: Home  PCP:  Kathyrn Lass, MD  Cardiologist:  Camyla Camposano [place], MD  Electrophysiologist:  None   Evaluation Performed:  Follow-Up Visit  Chief Complaint:  Follow up CAD  History of Present Illness:    Logan Wells is a 63 y.o. male with PMH of HTN, HLD and CAD s/p CABG. Patient was initially referred for evaluation of chest pain.  He had a normal ETT and was set up for coronary CTA in December 2018.  This showed a coronary artery calcium score of 1562 Agaston units and a severe stenosis in the proximal to mid LAD and left PDA.  Possible moderate stenosis in the nondominant RCA.  Patient eventually underwent cardiac catheterization and was found to have severe multivessel disease with normal EF and underwent CABG x4 by Dr. Prescott Gum on 01/06/2017 with LIMA to LAD, SVG to diagonal, SVG to OM and SVG to PDB of distal LCx.  Postoperative recovery was fairly uneventful without any arrhythmia.  He was placed on aspirin, beta-blocker, statin therapy.    Unfortunately patient went back to the  hospital on 03/09/2017 with recurrent chest pain.  His d-dimer was positive at 0.91.  Serial troponin negative.  TSH normal.  He eventually underwent a relook cardiac catheterization on 03/09/2017 which showed bypass graft failure with occlusion of SVG to PDA and SVG to OM1, patent SVG to diagonal, patent but  atretic appearing LIMA to the mid LAD.  90% mid LAD lesion, 50-60% ostial OM 2 lesion, 80-90% left PDA lesion, nondominant RCA with proximal 95% disease and the mid 90% disease.  Medical therapy was recommended.    30 mg daily of Imdur was added. He is on high dose Crestor now and Zetia.  He was seen in June 2019 by Almyra Deforest PA-C for chest pain. A Myoview was ordered and was normal.  He rarely gets chest heaviness. He has some lighthedness with standing too quickly. No SOB or palpitations.   The patient does not have symptoms concerning for COVID-19 infection (fever, chills, cough, or new shortness of breath).    Past Medical History:  Diagnosis Date   Arthritis    "left shoulder" (03/09/2017)   CAD in native artery 12/2016   Chest pain 11/19/2016   GERD (gastroesophageal reflux disease)    High cholesterol    Hypertension    NSTEMI (non-ST elevated myocardial infarction) ([place]) 12/2016; 03/09/2017   Past Surgical History:  Procedure Laterality Date   CARDIAC CATHETERIZATION  01/05/2017; 03/09/2017   CORONARY ARTERY BYPASS GRAFT N/A 01/06/2017   Procedure: CORONARY ARTERY BYPASS GRAFTING (CABG) x4 , USING LEFT INTERNAL  MAMMARY ARTERY AND RIGHT GREATER SAPHENOUS VEIN HARVESTED ENDOSCOPICALLY;  Surgeon: Ivin Poot, MD;  Location: [facility];  Service: Open Heart Surgery;  Laterality: N/A;   LEFT HEART CATH AND CORONARY ANGIOGRAPHY N/A 01/05/2017   Procedure: LEFT HEART CATH AND CORONARY ANGIOGRAPHY;  Surgeon: Martinique, Kohner Orlick M, MD;  Location: [facility];  Service: Cardiovascular;  Laterality: N/A;   LEFT HEART CATH AND CORS/GRAFTS ANGIOGRAPHY N/A 03/09/2017   Procedure:  LEFT HEART CATH AND CORS/GRAFTS ANGIOGRAPHY;  Surgeon: Belva Crome, MD;  Location: [facility];  Service: Cardiovascular;  Laterality: N/A;   SHOULDER HEMI-ARTHROPLASTY Left    "partial shoulder replacement"   TEE WITHOUT CARDIOVERSION N/A 01/06/2017   Procedure: TRANSESOPHAGEAL ECHOCARDIOGRAM (TEE);  Surgeon: Prescott Gum, Collier Salina, MD;  Location: [facility];  Service: Open Heart Surgery;  Laterality: N/A;   ULTRASOUND GUIDANCE FOR VASCULAR ACCESS  03/09/2017   Procedure: Ultrasound Guidance For Vascular Access;  Surgeon: Belva Crome, MD;  Location: [facility];  Service: Cardiovascular;;     Current Meds  Medication Sig   amLODipine (NORVASC) 2.5 MG tablet Take 1 tablet (2.5 mg total) by mouth daily.   aspirin 81 MG chewable tablet Chew 1 tablet (81 mg total) by mouth daily.   cetirizine (ZYRTEC) 10 MG tablet Take 1 tablet by mouth daily.   clopidogrel (PLAVIX) 75 MG tablet Take 1 tablet (75 mg total) by mouth daily. Please keep upcoming appt for future refills. Thank you   isosorbide mononitrate (IMDUR) 60 MG 24 hr tablet Take 1 tablet (60 mg total) by mouth daily.   metoprolol tartrate (LOPRESSOR) 25 MG tablet Take 1 tablet (25 mg total) by mouth 2 (two) times daily.   pantoprazole (PROTONIX) 40 MG tablet Take 1 tablet (40 mg total) by mouth daily.   rosuvastatin (CRESTOR) 40 MG tablet Take 1 tablet (40 mg total) by mouth daily.   scopolamine (TRANSDERM-SCOP, 1.5 MG,) 1 MG/3DAYS Place 1 patch (1.5 mg total) onto the skin every 3 (three) days.     Allergies:   Lipitor [atorvastatin calcium]   Social History   Tobacco Use   Smoking status: Never Smoker   Smokeless tobacco: Never Used  Substance Use Topics   Alcohol use: Yes    Alcohol/week: 14.0 standard drinks    Types: 14 Glasses of wine per week    Comment: daily   Drug use: No     Family Hx: The patient's family history includes CAD in his mother; Heart attack (age of onset: 34) in his mother; Heart  disease in his mother; Multiple myeloma in his father.  ROS:   Please see the history of present illness.    All other systems reviewed and are negative.   Prior CV studies:   The following studies were reviewed today:  Cath 03/09/2017 Conclusion    Bypass graft failure with occlusion of the SVG to the PDA and SVG to the first obtuse marginal.  Patent SVG to diagonal.  Patent although atretic appearing LIMA to the mid LAD.  Normal left main.  Total occlusion of the proximal to mid LAD. 90% ostial LAD obstruction.  50-60% obstruction in the ostial to proximal second obtuse marginal which is the dominant obtuse marginal previously bypassed.  Severe diffusely diseased distal circumflex into the left PDA between 80 and 95% throughout the involved region.  Nondominant right coronary with proximal 95% obstruction and mid 90% obstruction after the first acute marginal branch  Normal left ventricular function. Normal  left ventricular end-diastolic pressure.  RECOMMENDATIONS:   Medical therapy.     Myoview 07/24/17: Study Highlights    Nuclear stress EF: 54%. The left ventricular ejection fraction is mildly decreased (45-54%). Visually the ejection fraction appears to be greater than 54%.  The study is normal. No evidence of ischemia. No evidence of previous infarction.  This is a low risk study.     Labs/Other Tests and Data Reviewed:    EKG:  No ECG reviewed.  Recent Labs: No results found for requested labs within last 8760 hours.   Recent Lipid Panel Lab Results  Component Value Date/Time   CHOL 147 07/07/2017 08:09 AM   TRIG 93 07/07/2017 08:09 AM   HDL 65 07/07/2017 08:09 AM   CHOLHDL 2.3 07/07/2017 08:09 AM   CHOLHDL 2.7 01/05/2017 08:08 AM   LDLCALC 63 07/07/2017 08:09 AM    Wt Readings from Last 3 Encounters:  08/17/18 182 lb (82.6 kg)  11/20/17 173 lb 3.2 oz (78.6 kg)  07/24/17 173 lb (78.5 kg)     Objective:    Vital Signs:  BP  112/70    Pulse 73    Ht 5' 10"  (1.778 m)    Wt 182 lb (82.6 kg)    BMI 26.11 kg/m    VITAL SIGNS:  reviewed  ASSESSMENT & PLAN:    1. CAD s/p CABG: Found to have multivessel disease on coronary CT and underwent bypass surgery, Unfortunately repeat cardiac catheterization showed 2 out of 4 bypass grafts with early occlusion.  The LIMA to the LAD and SVG to diagonal are patent. One SVG went to a very small nondominant RCA. The other SVG went to the left PDA. He was seen in June 2019 with some chest pain but Myoview was normal.   The LCx distally into the PDA is really diffusely diseased and small and really not a great target for PCI. He has stable class 1-2 angina. We will continue medical therapy.   2. Hypertension: well controlled.  3. Hyperlipidemia: LDL improved to 63 on high dose statin and Zetia. He is due for follow up lab work with primary care.    COVID-19 Education: The signs and symptoms of COVID-19 were discussed with the patient and how to seek care for testing (follow up with PCP or arrange E-visit).  The importance of social distancing was discussed today.  Time:   Today, I have spent 15 minutes with the patient with telehealth technology discussing the above problems.     Medication Adjustments/Labs and Tests Ordered: Current medicines are reviewed at length with the patient today.  Concerns regarding medicines are outlined above.   Tests Ordered: No orders of the defined types were placed in this encounter.   Medication Changes: No orders of the defined types were placed in this encounter.   Follow Up:  In Person in 8 month(s)  Signed, Keyia Moretto Martinique, MD  08/17/2018 2:53 PM    [place]

## 2018-08-17 ENCOUNTER — Telehealth (INDEPENDENT_AMBULATORY_CARE_PROVIDER_SITE_OTHER): Payer: Managed Care, Other (non HMO) | Admitting: Cardiology

## 2018-08-17 VITALS — BP 112/70 | HR 73 | Ht 70.0 in | Wt 182.0 lb

## 2018-08-17 DIAGNOSIS — E785 Hyperlipidemia, unspecified: Secondary | ICD-10-CM

## 2018-08-17 DIAGNOSIS — I25708 Atherosclerosis of coronary artery bypass graft(s), unspecified, with other forms of angina pectoris: Secondary | ICD-10-CM

## 2018-08-17 DIAGNOSIS — I10 Essential (primary) hypertension: Secondary | ICD-10-CM

## 2018-08-17 DIAGNOSIS — Z951 Presence of aortocoronary bypass graft: Secondary | ICD-10-CM

## 2018-08-17 MED ORDER — PANTOPRAZOLE SODIUM 40 MG PO TBEC: 40.0000 mg | DELAYED_RELEASE_TABLET | Freq: Every day | ORAL | 3 refills | Status: DC

## 2018-08-17 NOTE — Patient Instructions (Signed)
Medication Instructions:  Continue same medications If you need a refill on your cardiac medications before your next appointment, please call your pharmacy.   Lab work: None ordered   Testing/Procedures: None ordered  Follow-Up: At [facility], you and your health needs are our priority.  As part of our continuing mission to provide you with exceptional heart care, we have created designated Provider Care Teams.  These Care Teams include your primary Cardiologist (physician) and Advanced Practice Providers (APPs -  Physician Assistants and Nurse Practitioners) who all work together to provide you with the care you need, when you need it. . Schedule follow up appointment in 6 to 8 months     Call 3 months before to schedule

## 2018-09-05 ENCOUNTER — Other Ambulatory Visit: Payer: Self-pay | Admitting: Physician Assistant

## 2018-10-27 ENCOUNTER — Telehealth: Payer: Managed Care, Other (non HMO) | Admitting: Family

## 2018-10-27 DIAGNOSIS — J069 Acute upper respiratory infection, unspecified: Secondary | ICD-10-CM | POA: Diagnosis not present

## 2018-10-27 DIAGNOSIS — J301 Allergic rhinitis due to pollen: Secondary | ICD-10-CM | POA: Diagnosis not present

## 2018-10-27 NOTE — Progress Notes (Signed)
E visit for Allergic Rhinitis We are sorry that you are not feeling well.  Here is how we plan to help!  Based on what you have shared with me it looks like you have Allergic Rhinitis.  Rhinitis is when a reaction occurs that causes nasal congestion, runny nose, sneezing, and itching.  Most types of rhinitis are caused by an inflammation and are associated with symptoms in the eyes ears or throat. There are several types of rhinitis.  The most common are acute rhinitis, which is usually caused by a viral illness, allergic or seasonal rhinitis, and nonallergic or year-round rhinitis.  Nasal allergies occur certain times of the year.  Allergic rhinitis is caused when allergens in the air trigger the release of histamine in the body.  Histamine causes itching, swelling, and fluid to build up in the fragile linings of the nasal passages, sinuses and eyelids.  An itchy nose and clear discharge are common.  I recommend the following over the counter treatments: Xyzal 5 mg take 1 tablet daily  I also would recommend a nasal spray: Flonase 2 sprays into each nostril once daily   Providers prescribe antibiotics to treat infections caused by bacteria. Antibiotics are very powerful in treating bacterial infections when they are used properly. To maintain their effectiveness, they should be used only when necessary. Overuse of antibiotics has resulted in the development of superbugs that are resistant to treatment!    After careful review of your answers, I would not recommend an antibiotic for your condition.  Antibiotics are not effective against viruses and therefore should not be used to treat them. Common examples of infections caused by viruses include colds and flu.   If your symptoms worsen or do not improve over the next 5-7 days, you can resubmit an evisit and we can send in an antibiotic at that time.   Approximately 5 minutes was spent documenting and reviewing patient's chart.    HOME  CARE:   You can use an over-the-counter saline nasal spray as needed  Avoid areas where there is heavy dust, mites, or molds  Stay indoors on windy days during the pollen season  Keep windows closed in home, at least in bedroom; use air conditioner.  Use high-efficiency house air filter  Keep windows closed in car, turn AC on re-circulate  Avoid playing out with dog during pollen season  GET HELP RIGHT AWAY IF:   If your symptoms do not improve within 10 days  You become short of breath  You develop yellow or green discharge from your nose for over 3 days  You have coughing fits  MAKE SURE YOU:   Understand these instructions  Will watch your condition  Will get help right away if you are not doing well or get worse  Thank you for choosing an e-visit. Your e-visit answers were reviewed by a board certified advanced clinical practitioner to complete your personal care plan. Depending upon the condition, your plan could have included both over the counter or prescription medications. Please review your pharmacy choice. Be sure that the pharmacy you have chosen is open so that you can pick up your prescription now.  If there is a problem you may message your provider in [place] to have the prescription routed to another pharmacy. Your safety is important to [place]. If you have drug allergies check your prescription carefully.  For the next 24 hours, you can use MyChart to ask questions about today's visit, request a non-urgent call  back, or ask for a work or school excuse from your e-visit provider. You will get an email in the next two days asking about your experience. I hope that your e-visit has been valuable and will speed your recovery.

## 2018-11-04 ENCOUNTER — Other Ambulatory Visit: Payer: Self-pay | Admitting: Cardiology

## 2018-11-04 NOTE — Telephone Encounter (Signed)
Please advise for refill alternative. Please select alternative for Clopidogrel.

## 2019-01-10 ENCOUNTER — Other Ambulatory Visit: Payer: Self-pay

## 2019-01-10 MED ORDER — ROSUVASTATIN CALCIUM 40 MG PO TABS: 40.0000 mg | ORAL_TABLET | Freq: Every day | ORAL | 6 refills | Status: DC

## 2019-02-08 NOTE — Progress Notes (Signed)
Cardiology Office Note    Date:  02/15/2019   ID:  Logan Wells, DOB 1955/07/18, MRN [id number]  PCP:  Kathyrn Lass, MD  Cardiologist:  Dr. Martinique  Chief Complaint  Patient presents with  . Coronary Artery Disease    History of Present Illness:  Logan Wells is a 64 y.o. male with PMH of HTN, HLD and CAD s/p CABG. Coronary CTA in December 2018.  This showed a coronary artery calcium score of 1562 Agaston units and a severe stenosis in the proximal to mid LAD and left PDA.  Possible moderate stenosis in the nondominant RCA.  Patient eventually underwent cardiac catheterization and was found to have severe multivessel disease with normal EF and underwent CABG x4 by Dr. Prescott Gum on 01/06/2017 with LIMA to LAD, SVG to diagonal, SVG to OM and SVG to PDB of distal LCx.      He was placed on aspirin, beta-blocker, statin therapy.    Unfortunately patient went back to the hospital on 03/09/2017 with recurrent chest pain.  His d-dimer was positive at 0.91.  Serial troponin negative.  TSH normal.  He eventually underwent a relook cardiac catheterization on 03/09/2017 which showed bypass graft failure with occlusion of SVG to PDA and SVG to OM1, patent SVG to diagonal, patent but  atretic appearing LIMA to the mid LAD.  90% mid LAD lesion, 50-60% ostial OM 2 lesion, 80-90% left PDA lesion, nondominant RCA with proximal 95% disease and the mid 90% disease.  Medical therapy was recommended.    30 mg daily of Imdur was added. He is on high dose Crestor now and Zetia.  He was seen in June by Almyra Deforest PA-C for chest pain. A Myoview was ordered and was normal.   On follow up today he reports he is doing OK. He is not exercising as much and limits his visit to the fitness center to twice a week. Notes some chest discomfort when he first starts exercising but then it goes away.    Has not had to use sl Ntg. He denies any  SOB, palpitations or edema. Tolerating medication well. He reports some fatigue during the  day.   Past Medical History:  Diagnosis Date  . Arthritis    "left shoulder" (03/09/2017)  . CAD in native artery 12/2016  . Chest pain 11/19/2016  . GERD (gastroesophageal reflux disease)   . High cholesterol   . Hypertension   . NSTEMI (non-ST elevated myocardial infarction) ([place]) 12/2016; 03/09/2017    Past Surgical History:  Procedure Laterality Date  . CARDIAC CATHETERIZATION  01/05/2017; 03/09/2017  . CORONARY ARTERY BYPASS GRAFT N/A 01/06/2017   Procedure: CORONARY ARTERY BYPASS GRAFTING (CABG) x4 , USING LEFT INTERNAL MAMMARY ARTERY AND RIGHT GREATER SAPHENOUS VEIN HARVESTED ENDOSCOPICALLY;  Surgeon: Ivin Poot, MD;  Location: [place];  Service: Open Heart Surgery;  Laterality: N/A;  . LEFT HEART CATH AND CORONARY ANGIOGRAPHY N/A 01/05/2017   Procedure: LEFT HEART CATH AND CORONARY ANGIOGRAPHY;  Surgeon: Martinique, Nina Hoar M, MD;  Location: [place] [facility];  Service: Cardiovascular;  Laterality: N/A;  . LEFT HEART CATH AND CORS/GRAFTS ANGIOGRAPHY N/A 03/09/2017   Procedure: LEFT HEART CATH AND CORS/GRAFTS ANGIOGRAPHY;  Surgeon: Belva Crome, MD;  Location: [place] [facility];  Service: Cardiovascular;  Laterality: N/A;  . SHOULDER HEMI-ARTHROPLASTY Left    "partial shoulder replacement"  . TEE WITHOUT CARDIOVERSION N/A 01/06/2017   Procedure: TRANSESOPHAGEAL ECHOCARDIOGRAM (TEE);  Surgeon: Prescott Gum, Collier Salina, MD;  Location: [facility]  OR;  Service: Open Heart Surgery;  Laterality: N/A;  . ULTRASOUND GUIDANCE FOR VASCULAR ACCESS  03/09/2017   Procedure: Ultrasound Guidance For Vascular Access;  Surgeon: Belva Crome, MD;  Location: [facility];  Service: Cardiovascular;;    Current Medications: Outpatient Medications Prior to Visit  Medication Sig Dispense Refill  . amLODipine (NORVASC) 2.5 MG tablet TAKE 1 TABLET(2.5 MG) BY MOUTH DAILY 90 tablet 2  . aspirin 81 MG chewable tablet Chew 1 tablet (81 mg total) by mouth daily.    . cetirizine (ZYRTEC) 10 MG tablet Take 1 tablet  by mouth daily.    . clopidogrel (PLAVIX) 75 MG tablet Take 1 tablet (75 mg total) by mouth daily. 90 tablet 3  . ezetimibe (ZETIA) 10 MG tablet Take 1 tablet by mouth daily.    . fluticasone (FLONASE) 50 MCG/ACT nasal spray SHAKE LQ AND U 2 SPRAYS IEN QD    . isosorbide mononitrate (IMDUR) 60 MG 24 hr tablet TAKE 1 TABLET(60 MG) BY MOUTH DAILY 90 tablet 2  . metoprolol tartrate (LOPRESSOR) 25 MG tablet Take 1 tablet (25 mg total) by mouth 2 (two) times daily. 180 tablet 2  . pantoprazole (PROTONIX) 40 MG tablet Take 1 tablet (40 mg total) by mouth daily. 90 tablet 3  . rosuvastatin (CRESTOR) 40 MG tablet Take 1 tablet (40 mg total) by mouth daily. 30 tablet 6  . scopolamine (TRANSDERM-SCOP, 1.5 MG,) 1 MG/3DAYS Place 1 patch (1.5 mg total) onto the skin every 3 (three) days. 2 patch 0  . nitroGLYCERIN (NITROSTAT) 0.4 MG SL tablet Place 1 tablet (0.4 mg total) under the tongue every 5 (five) minutes as needed for chest pain. 25 tablet 11   No facility-administered medications prior to visit.     Allergies:   Lipitor [atorvastatin calcium]   Social History   Socioeconomic History  . Marital status: Married    Spouse name: Not on file  . Number of children: Not on file  . Years of education: Not on file  . Highest education level: Not on file  Occupational History  . Not on file  Tobacco Use  . Smoking status: Never Smoker  . Smokeless tobacco: Never Used  Substance and Sexual Activity  . Alcohol use: Yes    Alcohol/week: 14.0 standard drinks    Types: 14 Glasses of wine per week    Comment: daily  . Drug use: No  . Sexual activity: Not on file  Other Topics Concern  . Not on file  Social History Narrative  . Not on file   Social Determinants of Health   Financial Resource Strain:   . Difficulty of Paying Living Expenses: Not on file  Food Insecurity:   . Worried About Charity fundraiser in the Last Year: Not on file  . Ran Out of Food in the Last Year: Not on file    Transportation Needs:   . Lack of Transportation (Medical): Not on file  . Lack of Transportation (Non-Medical): Not on file  Physical Activity:   . Days of Exercise per Week: Not on file  . Minutes of Exercise per Session: Not on file  Stress:   . Feeling of Stress : Not on file  Social Connections:   . Frequency of Communication with Friends and Family: Not on file  . Frequency of Social Gatherings with Friends and Family: Not on file  . Attends Religious Services: Not on file  . Active Member of Clubs or Organizations:  Not on file  . Attends Archivist Meetings: Not on file  . Marital Status: Not on file     Family History:  The patient's family history includes CAD in his mother; Heart attack (age of onset: 37) in his mother; Heart disease in his mother; Multiple myeloma in his father.   ROS:   Please see the history of present illness.    ROS All other systems reviewed and are negative.   PHYSICAL EXAM:   VS:  BP 118/77   Pulse (!) 55   Temp 97.9 F (36.6 C)   Ht 5' 10"  (1.778 m)   Wt 178 lb 3.2 oz (80.8 kg)   SpO2 97%   BMI 25.57 kg/m    GENERAL:  Well appearing WM in NAD HEENT:  PERRL, EOMI, sclera are clear. Oropharynx is clear. NECK:  No jugular venous distention, carotid upstroke brisk and symmetric, no bruits, no thyromegaly or adenopathy LUNGS:  Clear to auscultation bilaterally CHEST:  Unremarkable HEART:  RRR,  PMI not displaced or sustained,S1 and S2 within normal limits, no S3, no S4: no clicks, no rubs, no murmurs ABD:  Soft, nontender. BS +, no masses or bruits. No hepatomegaly, no splenomegaly EXT:  2 + pulses throughout, no edema, no cyanosis no clubbing SKIN:  Warm and dry.  No rashes NEURO:  Alert and oriented x 3. Cranial nerves II through XII intact. PSYCH:  Cognitively intact      Wt Readings from Last 3 Encounters:  02/15/19 178 lb 3.2 oz (80.8 kg)  08/17/18 182 lb (82.6 kg)  11/20/17 173 lb 3.2 oz (78.6 kg)       Studies/Labs Reviewed:   EKG:  EKG is not ordered today.     Recent Labs: No results found for requested labs within last 8760 hours.   Lipid Panel    Component Value Date/Time   CHOL 147 07/07/2017 0809   TRIG 93 07/07/2017 0809   HDL 65 07/07/2017 0809   CHOLHDL 2.3 07/07/2017 0809   CHOLHDL 2.7 01/05/2017 0808   VLDL 16 01/05/2017 0808   LDLCALC 63 07/07/2017 0809   Dated 12/07/18: cholesterol 158, triglycerides 82, HDL 73, LDL 68. Chemistries normal  Ecg today shows NSR with rate 58. Normal Ecg. I have personally reviewed and interpreted this study.   Additional studies/ records that were reviewed today include:   Cath 03/09/2017 Conclusion    Bypass graft failure with occlusion of the SVG to the PDA and SVG to the first obtuse marginal.   Patent SVG to diagonal.  Patent although atretic appearing LIMA to the mid LAD.   Normal left main.  Total occlusion of the proximal to mid LAD.  90% ostial LAD obstruction.  50-60% obstruction in the ostial to proximal second obtuse marginal which is the dominant obtuse marginal previously bypassed.  Severe diffusely diseased distal circumflex into the left PDA between 80 and 95% throughout the involved region.  Nondominant right coronary with proximal 95% obstruction and mid 90% obstruction after the first acute marginal branch  Normal left ventricular function.  Normal left ventricular end-diastolic pressure.  RECOMMENDATIONS:   Medical therapy.     Myoview 07/24/17: Study Highlights    Nuclear stress EF: 54%. The left ventricular ejection fraction is mildly decreased (45-54%). Visually the ejection fraction appears to be greater than 54%.  The study is normal. No evidence of ischemia. No evidence of previous infarction.  This is a low risk study.     ASSESSMENT:  1. Coronary artery disease of bypass graft of native heart with stable angina pectoris ([place])   2. S/P CABG x 4   3. Essential  hypertension   4. Hyperlipidemia, unspecified hyperlipidemia type      PLAN:  In order of problems listed above:   1. CAD s/p CABG: Found to have multivessel disease on coronary CT and underwent bypass surgery, unfortunately repeat cardiac catheterization showed 2 out of 4 bypass grafts with early occlusion.  The LIMA to the LAD and SVG to diagonal are patent. One SVG went to a very small nondominant RCA. The other SVG went to the left PDA. He was seen in June with some chest pain but Myoview was normal. On review of prior angiogram the LCx distally into the PDA is really diffusely diseased and small and really not a great target for PCI. He has stable class 1-2 angina. We will continue medical therapy. On Imdur, amlodipine, metoprolol.   2. Hypertension: well controlled.  3. Hyperlipidemia: LDL improved to 68 on high dose statin and Zetia.     Medication Adjustments/Labs and Tests Ordered: Current medicines are reviewed at length with the patient today.  Concerns regarding medicines are outlined above.  Medication changes, Labs and Tests ordered today are listed in the Patient Instructions below. There are no Patient Instructions on file for this visit.   Signed, Ronie Barnhart Martinique, MD  02/15/2019 10:23 AM    [facility]

## 2019-02-15 ENCOUNTER — Encounter: Payer: Self-pay | Admitting: Cardiology

## 2019-02-15 ENCOUNTER — Ambulatory Visit: Payer: Managed Care, Other (non HMO) | Admitting: Cardiology

## 2019-02-15 ENCOUNTER — Other Ambulatory Visit: Payer: Self-pay

## 2019-02-15 VITALS — BP 118/77 | HR 55 | Temp 97.9°F | Ht 70.0 in | Wt 178.2 lb

## 2019-02-15 DIAGNOSIS — Z951 Presence of aortocoronary bypass graft: Secondary | ICD-10-CM | POA: Diagnosis not present

## 2019-02-15 DIAGNOSIS — E785 Hyperlipidemia, unspecified: Secondary | ICD-10-CM

## 2019-02-15 DIAGNOSIS — I25708 Atherosclerosis of coronary artery bypass graft(s), unspecified, with other forms of angina pectoris: Secondary | ICD-10-CM | POA: Diagnosis not present

## 2019-02-15 DIAGNOSIS — I10 Essential (primary) hypertension: Secondary | ICD-10-CM | POA: Diagnosis not present

## 2019-05-06 ENCOUNTER — Other Ambulatory Visit: Payer: Self-pay

## 2019-05-06 MED ORDER — METOPROLOL TARTRATE 25 MG PO TABS: 25.0000 mg | ORAL_TABLET | Freq: Two times a day (BID) | ORAL | 3 refills | Status: DC

## 2019-06-04 ENCOUNTER — Other Ambulatory Visit: Payer: Self-pay | Admitting: Physician Assistant

## 2019-08-10 ENCOUNTER — Other Ambulatory Visit: Payer: Self-pay

## 2019-08-10 MED ORDER — ROSUVASTATIN CALCIUM 40 MG PO TABS: 40.0000 mg | ORAL_TABLET | Freq: Every day | ORAL | 6 refills | Status: DC

## 2019-08-18 NOTE — Progress Notes (Signed)
Cardiology Office Note    Date:  08/19/2019   ID:  Logan Wells, DOB 01-19-1956, MRN [id number]  PCP:  Kathyrn Lass, MD  Cardiologist:  Dr. Martinique  Chief Complaint  Patient presents with  . Coronary Artery Disease    History of Present Illness:  Logan Wells is a 64 y.o. male with PMH of HTN, HLD and CAD s/p CABG. He had a Coronary CTA in December 2018.  This showed a coronary artery calcium score of 1562 Agaston units and a severe stenosis in the proximal to mid LAD and left PDA.  Possible moderate stenosis in the nondominant RCA.  Patient eventually underwent cardiac catheterization and was found to have severe multivessel disease with normal EF and underwent CABG x4 by Dr. Prescott Gum on 01/06/2017 with LIMA to LAD, SVG to diagonal, SVG to OM and SVG to PDB of distal LCx.    He was placed on aspirin, beta-blocker, statin therapy.    Unfortunately patient went back to the hospital on 03/09/2017 with recurrent chest pain.  His d-dimer was positive at 0.91.  Serial troponin negative.  TSH normal.  He eventually underwent a relook cardiac catheterization on 03/09/2017 which showed bypass graft failure with occlusion of SVG to PDA and SVG to nondominant RCA, patent SVG to diagonal, patent but  atretic appearing LIMA to the mid LAD.  90% mid LAD lesion, 50-60% ostial OM 2 lesion, 80-90% left PDA lesion, nondominant RCA with proximal 95% disease and the mid 90% disease.  Medical therapy was recommended.  30 mg daily of Imdur was added. He is on high dose Crestor  and Zetia.  He was seen in June 2019 by Almyra Deforest PA-C for chest pain. A Myoview was ordered and was normal.   On follow up today he reports he is doingwell.  He is  exercising 5 days a week walking on a treadmill for 30 minutes and doing some weight lifting for 30 minutes. He denies any chest pain or dyspnea. His only complaint is that he seems to hit a wall a couple of times a day and gets fatigued. Will take a nap occasionally.   Has not  had to use sl Ntg. He notes his mother is now in Hospice care.   Past Medical History:  Diagnosis Date  . Arthritis    "left shoulder" (03/09/2017)  . CAD in native artery 12/2016  . Chest pain 11/19/2016  . GERD (gastroesophageal reflux disease)   . High cholesterol   . Hypertension   . NSTEMI (non-ST elevated myocardial infarction) ([place]) 12/2016; 03/09/2017    Past Surgical History:  Procedure Laterality Date  . CARDIAC CATHETERIZATION  01/05/2017; 03/09/2017  . CORONARY ARTERY BYPASS GRAFT N/A 01/06/2017   Procedure: CORONARY ARTERY BYPASS GRAFTING (CABG) x4 , USING LEFT INTERNAL MAMMARY ARTERY AND RIGHT GREATER SAPHENOUS VEIN HARVESTED ENDOSCOPICALLY;  Surgeon: Ivin Poot, MD;  Location: [place];  Service: Open Heart Surgery;  Laterality: N/A;  . LEFT HEART CATH AND CORONARY ANGIOGRAPHY N/A 01/05/2017   Procedure: LEFT HEART CATH AND CORONARY ANGIOGRAPHY;  Surgeon: Martinique, Peter M, MD;  Location: [place] [facility];  Service: Cardiovascular;  Laterality: N/A;  . LEFT HEART CATH AND CORS/GRAFTS ANGIOGRAPHY N/A 03/09/2017   Procedure: LEFT HEART CATH AND CORS/GRAFTS ANGIOGRAPHY;  Surgeon: Belva Crome, MD;  Location: [place] [facility];  Service: Cardiovascular;  Laterality: N/A;  . SHOULDER HEMI-ARTHROPLASTY Left    "partial shoulder replacement"  . TEE WITHOUT CARDIOVERSION N/A 01/06/2017  Procedure: TRANSESOPHAGEAL ECHOCARDIOGRAM (TEE);  Surgeon: Prescott Gum, Collier Salina, MD;  Location: [facility];  Service: Open Heart Surgery;  Laterality: N/A;  . ULTRASOUND GUIDANCE FOR VASCULAR ACCESS  03/09/2017   Procedure: Ultrasound Guidance For Vascular Access;  Surgeon: Belva Crome, MD;  Location: [place] [facility];  Service: Cardiovascular;;    Current Medications: Outpatient Medications Prior to Visit  Medication Sig Dispense Refill  . amLODipine (NORVASC) 2.5 MG tablet TAKE 1 TABLET(2.5 MG) BY MOUTH DAILY 90 tablet 2  . aspirin 81 MG chewable tablet Chew 1 tablet (81 mg total) by mouth  daily.    . cetirizine (ZYRTEC) 10 MG tablet Take 1 tablet by mouth daily.    . clopidogrel (PLAVIX) 75 MG tablet Take 1 tablet (75 mg total) by mouth daily. 90 tablet 3  . fluticasone (FLONASE) 50 MCG/ACT nasal spray SHAKE LQ AND U 2 SPRAYS IEN QD    . isosorbide mononitrate (IMDUR) 60 MG 24 hr tablet TAKE 1 TABLET(60 MG) BY MOUTH DAILY 90 tablet 2  . metoprolol tartrate (LOPRESSOR) 25 MG tablet Take 1 tablet (25 mg total) by mouth 2 (two) times daily. 180 tablet 3  . scopolamine (TRANSDERM-SCOP, 1.5 MG,) 1 MG/3DAYS Place 1 patch (1.5 mg total) onto the skin every 3 (three) days. 2 patch 0  . pantoprazole (PROTONIX) 40 MG tablet Take 1 tablet (40 mg total) by mouth daily. 90 tablet 3  . rosuvastatin (CRESTOR) 40 MG tablet Take 1 tablet (40 mg total) by mouth daily. 30 tablet 6  . ezetimibe (ZETIA) 10 MG tablet Take 1 tablet by mouth daily.    . nitroGLYCERIN (NITROSTAT) 0.4 MG SL tablet Place 1 tablet (0.4 mg total) under the tongue every 5 (five) minutes as needed for chest pain. 25 tablet 11   No facility-administered medications prior to visit.     Allergies:   Lipitor [atorvastatin calcium]   Social History   Socioeconomic History  . Marital status: Married    Spouse name: Not on file  . Number of children: Not on file  . Years of education: Not on file  . Highest education level: Not on file  Occupational History  . Not on file  Tobacco Use  . Smoking status: Never Smoker  . Smokeless tobacco: Never Used  Vaping Use  . Vaping Use: Never used  Substance and Sexual Activity  . Alcohol use: Yes    Alcohol/week: 14.0 standard drinks    Types: 14 Glasses of wine per week    Comment: daily  . Drug use: No  . Sexual activity: Not on file  Other Topics Concern  . Not on file  Social History Narrative  . Not on file   Social Determinants of Health   Financial Resource Strain:   . Difficulty of Paying Living Expenses:   Food Insecurity:   . Worried About Sales executive in the Last Year:   . Arboriculturist in the Last Year:   Transportation Needs:   . Film/video editor (Medical):   Marland Kitchen Lack of Transportation (Non-Medical):   Physical Activity:   . Days of Exercise per Week:   . Minutes of Exercise per Session:   Stress:   . Feeling of Stress :   Social Connections:   . Frequency of Communication with Friends and Family:   . Frequency of Social Gatherings with Friends and Family:   . Attends Religious Services:   . Active Member of Clubs or Organizations:   .  Attends Archivist Meetings:   Marland Kitchen Marital Status:      Family History:  The patient's family history includes CAD in his mother; Heart attack (age of onset: 17) in his mother; Heart disease in his mother; Multiple myeloma in his father.   ROS:   Please see the history of present illness.    ROS All other systems reviewed and are negative.   PHYSICAL EXAM:   VS:  BP 104/72   Pulse 68   Ht 5' 11" (1.803 m)   Wt 177 lb (80.3 kg)   SpO2 94%   BMI 24.69 kg/m    GENERAL:  Well appearing WM in NAD HEENT:  PERRL, EOMI, sclera are clear. Oropharynx is clear. NECK:  No jugular venous distention, carotid upstroke brisk and symmetric, no bruits, no thyromegaly or adenopathy LUNGS:  Clear to auscultation bilaterally CHEST:  Unremarkable HEART:  RRR,  PMI not displaced or sustained,S1 and S2 within normal limits, no S3, no S4: no clicks, no rubs, no murmurs ABD:  Soft, nontender. BS +, no masses or bruits. No hepatomegaly, no splenomegaly EXT:  2 + pulses throughout, no edema, no cyanosis no clubbing SKIN:  Warm and dry.  No rashes NEURO:  Alert and oriented x 3. Cranial nerves II through XII intact. PSYCH:  Cognitively intact      Wt Readings from Last 3 Encounters:  08/19/19 177 lb (80.3 kg)  02/15/19 178 lb 3.2 oz (80.8 kg)  08/17/18 182 lb (82.6 kg)      Studies/Labs Reviewed:   EKG:  EKG is not ordered today.     Recent Labs: No results found for requested  labs within last 8760 hours.   Lipid Panel    Component Value Date/Time   CHOL 147 07/07/2017 0809   TRIG 93 07/07/2017 0809   HDL 65 07/07/2017 0809   CHOLHDL 2.3 07/07/2017 0809   CHOLHDL 2.7 01/05/2017 0808   VLDL 16 01/05/2017 0808   LDLCALC 63 07/07/2017 0809   Dated 12/07/18: cholesterol 158, triglycerides 82, HDL 73, LDL 68. Chemistries normal  Ecg today shows NSR with rate 58. Normal Ecg. I have personally reviewed and interpreted this study.   Additional studies/ records that were reviewed today include:   Cath 03/09/2017 Conclusion    Bypass graft failure with occlusion of the SVG to the PDA and SVG to the first obtuse marginal.   Patent SVG to diagonal.  Patent although atretic appearing LIMA to the mid LAD.   Normal left main.  Total occlusion of the proximal to mid LAD.  90% ostial LAD obstruction.  50-60% obstruction in the ostial to proximal second obtuse marginal which is the dominant obtuse marginal previously bypassed.  Severe diffusely diseased distal circumflex into the left PDA between 80 and 95% throughout the involved region.  Nondominant right coronary with proximal 95% obstruction and mid 90% obstruction after the first acute marginal branch  Normal left ventricular function.  Normal left ventricular end-diastolic pressure.  RECOMMENDATIONS:   Medical therapy.     Myoview 07/24/17: Study Highlights    Nuclear stress EF: 54%. The left ventricular ejection fraction is mildly decreased (45-54%). Visually the ejection fraction appears to be greater than 54%.  The study is normal. No evidence of ischemia. No evidence of previous infarction.  This is a low risk study.     ASSESSMENT:    1. Coronary artery disease of bypass graft of native heart with stable angina pectoris ([place])   2. S/P  CABG x 4   3. Essential hypertension   4. Hyperlipidemia, unspecified hyperlipidemia type      PLAN:  In order of problems listed  above:   1. CAD s/p CABG: Found to have multivessel disease on coronary CT and underwent bypass surgery, unfortunately repeat cardiac catheterization showed 2 out of 4 bypass grafts with early occlusion.  The LIMA to the LAD and SVG to diagonal are patent. One SVG went to a very small nondominant RCA. The other SVG went to the left PDA. He was seen in June 2019with some chest pain but Myoview was normal. On review of prior angiogram the LCx distally into the PDA is really diffusely diseased and small and really not a great target for PCI. He has stable class 1 angina. We will continue medical therapy. On Imdur, amlodipine, metoprolol.   2. Hypertension: well controlled.  3. Hyperlipidemia: LDL improved to 68 on high dose statin and Zetia. Follow up lab work with Dr Sabra Heck in November   Follow up in 6 months.    Medication Adjustments/Labs and Tests Ordered: Current medicines are reviewed at length with the patient today.  Concerns regarding medicines are outlined above.  Medication changes, Labs and Tests ordered today are listed in the Patient Instructions below. There are no Patient Instructions on file for this visit.   Signed, Peter Martinique, MD  08/19/2019 8:11 AM    [facility]

## 2019-08-19 ENCOUNTER — Ambulatory Visit: Payer: Managed Care, Other (non HMO) | Admitting: Cardiology

## 2019-08-19 ENCOUNTER — Other Ambulatory Visit: Payer: Self-pay

## 2019-08-19 ENCOUNTER — Encounter: Payer: Self-pay | Admitting: Cardiology

## 2019-08-19 VITALS — BP 104/72 | HR 68 | Ht 71.0 in | Wt 177.0 lb

## 2019-08-19 DIAGNOSIS — E785 Hyperlipidemia, unspecified: Secondary | ICD-10-CM

## 2019-08-19 DIAGNOSIS — Z951 Presence of aortocoronary bypass graft: Secondary | ICD-10-CM | POA: Diagnosis not present

## 2019-08-19 DIAGNOSIS — I10 Essential (primary) hypertension: Secondary | ICD-10-CM

## 2019-08-19 DIAGNOSIS — I25708 Atherosclerosis of coronary artery bypass graft(s), unspecified, with other forms of angina pectoris: Secondary | ICD-10-CM | POA: Diagnosis not present

## 2019-08-19 MED ORDER — ROSUVASTATIN CALCIUM 40 MG PO TABS: 40.0000 mg | ORAL_TABLET | Freq: Every day | ORAL | 3 refills | Status: DC

## 2019-11-07 ENCOUNTER — Other Ambulatory Visit: Payer: Self-pay | Admitting: Cardiology

## 2020-01-06 ENCOUNTER — Telehealth: Payer: Self-pay | Admitting: Cardiology

## 2020-01-06 NOTE — Telephone Encounter (Signed)
Pt c/o medication issue:  1. Name of Medication: losartan 50 mg  2. How are you currently taking this medication (dosage and times per day)? Not currently taking  3. Are you having a reaction (difficulty breathing--STAT)? no  4. What is your medication issue? [name] RN from [place] Dr. Wynonia Lawman office, states she will fax over the patient's recent office notes and lab work. She states the patient is not on an ACE or ARB and they got a letter from his insurance letting them. She states he was on losartan 50 mg prior to his cabg in 2018, but it was stopped on his discharge papers. She would like to know if Dr. Swaziland wants him on this medication.

## 2020-01-06 NOTE — Telephone Encounter (Signed)
Spoke with [name] from Dr. Garen Lah office at [place]. Per [name] Dr. Hyacinth Meeker got a letter on patient stating that patient was not on Ace or an Arb. Per [name], Dr. Hyacinth Meeker wanted to check with Dr. Swaziland and see if patient needs to be on one or not. Per chart review patient has not been on one since before CABG. Received fax of office note from today with Dr. Hyacinth Meeker for Dr. Swaziland to review. Bernerd Limbo that I would forward message to Dr. Swaziland to review and advise.

## 2020-01-09 NOTE — Telephone Encounter (Signed)
He has not been on losartan since 2018. Currently BP is controlled. I see no reason to add an ARB at this point unless BP is not optimal  Jhada Risk Swaziland MD, [facility]

## 2020-01-09 NOTE — Telephone Encounter (Signed)
Returned call to [name] at [facility] office left Dr.Jordan's advice on personal voice mail.Advised to call back if any questions.

## 2020-02-25 NOTE — Progress Notes (Unsigned)
Cardiology Office Note    Date:  02/27/2020   ID:  Logan Wells, DOB Apr 26, 1955, MRN [id number]  PCP:  Kathyrn Lass, MD  Cardiologist:  Dr. Martinique  Chief Complaint  Patient presents with  . Coronary Artery Disease    History of Present Illness:  Logan Wells is a 65 y.o. male with PMH of HTN, HLD and CAD s/p CABG. He had a Coronary CTA in December 2018.  This showed a coronary artery calcium score of 1562 Agaston units and a severe stenosis in the proximal to mid LAD and left PDA.  Possible moderate stenosis in the nondominant RCA.  Patient eventually underwent cardiac catheterization and was found to have severe multivessel disease with normal EF and underwent CABG x4 by Dr. Prescott Gum on 01/06/2017 with LIMA to LAD, SVG to diagonal, SVG to OM and SVG to PDB of distal LCx.    He was placed on aspirin, beta-blocker, statin therapy.    Unfortunately patient went back to the hospital on 03/09/2017 with recurrent chest pain.  His d-dimer was positive at 0.91.  Serial troponin negative.  TSH normal.  He eventually underwent a relook cardiac catheterization on 03/09/2017 which showed bypass graft failure with occlusion of SVG to PDA and SVG to nondominant RCA, patent SVG to diagonal, patent but  atretic appearing LIMA to the mid LAD.  90% mid LAD lesion, 50-60% ostial OM 2 lesion, 80-90% left PDA lesion, nondominant RCA with proximal 95% disease and the mid 90% disease.  Medical therapy was recommended.  30 mg daily of Imdur was added. He is on high dose Crestor  and Zetia.  He was seen in June 2019 by Almyra Deforest PA-C for chest pain. A Myoview was ordered and was normal.   On follow up today he reports he is doing well.  He is  exercising 5 days a week vigorously. He rarely has some chest pressure.  He denies any  dyspnea.   Has not had to use sl Ntg. His mother did pass this year. He is under increased stress at work since 2 associates passed away this year.    Past Medical History:  Diagnosis Date   . Arthritis    "left shoulder" (03/09/2017)  . CAD in native artery 12/2016  . Chest pain 11/19/2016  . GERD (gastroesophageal reflux disease)   . High cholesterol   . Hypertension   . NSTEMI (non-ST elevated myocardial infarction) ([place]) 12/2016; 03/09/2017    Past Surgical History:  Procedure Laterality Date  . CARDIAC CATHETERIZATION  01/05/2017; 03/09/2017  . CORONARY ARTERY BYPASS GRAFT N/A 01/06/2017   Procedure: CORONARY ARTERY BYPASS GRAFTING (CABG) x4 , USING LEFT INTERNAL MAMMARY ARTERY AND RIGHT GREATER SAPHENOUS VEIN HARVESTED ENDOSCOPICALLY;  Surgeon: Ivin Poot, MD;  Location: [place];  Service: Open Heart Surgery;  Laterality: N/A;  . LEFT HEART CATH AND CORONARY ANGIOGRAPHY N/A 01/05/2017   Procedure: LEFT HEART CATH AND CORONARY ANGIOGRAPHY;  Surgeon: Martinique, Makhya Arave M, MD;  Location: [place] [facility];  Service: Cardiovascular;  Laterality: N/A;  . LEFT HEART CATH AND CORS/GRAFTS ANGIOGRAPHY N/A 03/09/2017   Procedure: LEFT HEART CATH AND CORS/GRAFTS ANGIOGRAPHY;  Surgeon: Belva Crome, MD;  Location: [place] [facility];  Service: Cardiovascular;  Laterality: N/A;  . SHOULDER HEMI-ARTHROPLASTY Left    "partial shoulder replacement"  . TEE WITHOUT CARDIOVERSION N/A 01/06/2017   Procedure: TRANSESOPHAGEAL ECHOCARDIOGRAM (TEE);  Surgeon: Prescott Gum, Collier Salina, MD;  Location: [place];  Service: Open Heart Surgery;  Laterality: N/A;  . ULTRASOUND GUIDANCE FOR VASCULAR ACCESS  03/09/2017   Procedure: Ultrasound Guidance For Vascular Access;  Surgeon: Belva Crome, MD;  Location: [place] [facility];  Service: Cardiovascular;;    Current Medications: Outpatient Medications Prior to Visit  Medication Sig Dispense Refill  . amLODipine (NORVASC) 2.5 MG tablet TAKE 1 TABLET(2.5 MG) BY MOUTH DAILY 90 tablet 2  . aspirin 81 MG chewable tablet Chew 1 tablet (81 mg total) by mouth daily.    . cetirizine (ZYRTEC) 10 MG tablet Take 1 tablet by mouth daily.    . Cholecalciferol (VITAMIN  D3 ULTRA STRENGTH) 125 MCG (5000 UT) capsule See admin instructions.    . clopidogrel (PLAVIX) 75 MG tablet TAKE 1 TABLET(75 MG) BY MOUTH DAILY 90 tablet 3  . Cyanocobalamin (VITAMIN B 12) 500 MCG TABS 1 tablet    . fluticasone (FLONASE) 50 MCG/ACT nasal spray SHAKE LQ AND U 2 SPRAYS IEN QD    . isosorbide mononitrate (IMDUR) 60 MG 24 hr tablet TAKE 1 TABLET(60 MG) BY MOUTH DAILY 90 tablet 2  . metoprolol tartrate (LOPRESSOR) 25 MG tablet Take 1 tablet (25 mg total) by mouth 2 (two) times daily. 180 tablet 3  . rosuvastatin (CRESTOR) 40 MG tablet Take 1 tablet (40 mg total) by mouth daily. 90 tablet 3  . scopolamine (TRANSDERM-SCOP, 1.5 MG,) 1 MG/3DAYS Place 1 patch (1.5 mg total) onto the skin every 3 (three) days. 2 patch 0  . ezetimibe (ZETIA) 10 MG tablet Take 1 tablet by mouth daily.    . nitroGLYCERIN (NITROSTAT) 0.4 MG SL tablet Place 1 tablet (0.4 mg total) under the tongue every 5 (five) minutes as needed for chest pain. 25 tablet 11   No facility-administered medications prior to visit.     Allergies:   Lipitor [atorvastatin calcium]   Social History   Socioeconomic History  . Marital status: Married    Spouse name: Not on file  . Number of children: Not on file  . Years of education: Not on file  . Highest education level: Not on file  Occupational History  . Not on file  Tobacco Use  . Smoking status: Never Smoker  . Smokeless tobacco: Never Used  Vaping Use  . Vaping Use: Never used  Substance and Sexual Activity  . Alcohol use: Yes    Alcohol/week: 14.0 standard drinks    Types: 14 Glasses of wine per week    Comment: daily  . Drug use: No  . Sexual activity: Not on file  Other Topics Concern  . Not on file  Social History Narrative  . Not on file   Social Determinants of Health   Financial Resource Strain: Not on file  Food Insecurity: Not on file  Transportation Needs: Not on file  Physical Activity: Not on file  Stress: Not on file  Social  Connections: Not on file     Family History:  The patient's family history includes CAD in his mother; Heart attack (age of onset: 48) in his mother; Heart disease in his mother; Multiple myeloma in his father.   ROS:   Please see the history of present illness.    ROS All other systems reviewed and are negative.   PHYSICAL EXAM:   VS:  BP 112/76   Pulse (!) 59   Ht 5' 10"  (1.778 m)   Wt 176 lb (79.8 kg)   SpO2 94%   BMI 25.25 kg/m    GENERAL:  Well appearing WM  in NAD HEENT:  PERRL, EOMI, sclera are clear. Oropharynx is clear. NECK:  No jugular venous distention, carotid upstroke brisk and symmetric, no bruits, no thyromegaly or adenopathy LUNGS:  Clear to auscultation bilaterally CHEST:  Unremarkable HEART:  RRR,  PMI not displaced or sustained,S1 and S2 within normal limits, no S3, no S4: no clicks, no rubs, no murmurs ABD:  Soft, nontender. BS +, no masses or bruits. No hepatomegaly, no splenomegaly EXT:  2 + pulses throughout, no edema, no cyanosis no clubbing SKIN:  Warm and dry.  No rashes NEURO:  Alert and oriented x 3. Cranial nerves II through XII intact. PSYCH:  Cognitively intact      Wt Readings from Last 3 Encounters:  02/27/20 176 lb (79.8 kg)  08/19/19 177 lb (80.3 kg)  02/15/19 178 lb 3.2 oz (80.8 kg)      Studies/Labs Reviewed:   EKG:  EKG is not ordered today.     Recent Labs: No results found for requested labs within last 8760 hours.   Lipid Panel    Component Value Date/Time   CHOL 147 07/07/2017 0809   TRIG 93 07/07/2017 0809   HDL 65 07/07/2017 0809   CHOLHDL 2.3 07/07/2017 0809   CHOLHDL 2.7 01/05/2017 0808   VLDL 16 01/05/2017 0808   LDLCALC 63 07/07/2017 0809   Dated 12/07/18: cholesterol 158, triglycerides 82, HDL 73, LDL 68. Chemistries normal Dated 01/02/20: cholesterol 161, triglycerides 92, HDL 65, LDL 79. Creatinine 1.1. otherwise CBC, TSH, CMET normal.  Ecg today shows NSR with rate 59. Normal Ecg. I have personally  reviewed and interpreted this study.   Additional studies/ records that were reviewed today include:   Cath 03/09/2017 Conclusion    Bypass graft failure with occlusion of the SVG to the PDA and SVG to the first obtuse marginal.   Patent SVG to diagonal.  Patent although atretic appearing LIMA to the mid LAD.   Normal left main.  Total occlusion of the proximal to mid LAD.  90% ostial LAD obstruction.  50-60% obstruction in the ostial to proximal second obtuse marginal which is the dominant obtuse marginal previously bypassed.  Severe diffusely diseased distal circumflex into the left PDA between 80 and 95% throughout the involved region.  Nondominant right coronary with proximal 95% obstruction and mid 90% obstruction after the first acute marginal branch  Normal left ventricular function.  Normal left ventricular end-diastolic pressure.  RECOMMENDATIONS:   Medical therapy.     Myoview 07/24/17: Study Highlights    Nuclear stress EF: 54%. The left ventricular ejection fraction is mildly decreased (45-54%). Visually the ejection fraction appears to be greater than 54%.  The study is normal. No evidence of ischemia. No evidence of previous infarction.  This is a low risk study.     ASSESSMENT:    1. Coronary artery disease of bypass graft of native heart with stable angina pectoris ([place])   2. S/P CABG x 4   3. Essential hypertension   4. Hyperlipidemia, unspecified hyperlipidemia type      PLAN:  In order of problems listed above:   1. CAD s/p CABG: Found to have multivessel disease on coronary CT and underwent bypass surgery, unfortunately repeat cardiac catheterization showed 2 out of 4 bypass grafts with early occlusion.  The LIMA to the LAD and SVG to diagonal are patent. One SVG went to a very small nondominant RCA. The other SVG went to the left PDA. He was seen in June 2019 with some  chest pain but Myoview was normal. On review of prior angiogram the LCx  distally into the PDA is really diffusely diseased and small and really not a great target for PCI. He has stable class 1 angina. We will continue medical therapy. On Imdur, amlodipine, metoprolol.   2. Hypertension: well controlled.  3. Hyperlipidemia: on high dose statin and Zetia. Most recent lab showed mild increase in LDL to 79. Continue current therapy and lifestyle modification.    Follow up in 6 months.    Medication Adjustments/Labs and Tests Ordered: Current medicines are reviewed at length with the patient today.  Concerns regarding medicines are outlined above.  Medication changes, Labs and Tests ordered today are listed in the Patient Instructions below. There are no Patient Instructions on file for this visit.   Signed, Birdena Kingma Martinique, MD  02/27/2020 11:34 AM    [facility]

## 2020-02-27 ENCOUNTER — Other Ambulatory Visit: Payer: Self-pay

## 2020-02-27 ENCOUNTER — Encounter: Payer: Self-pay | Admitting: Cardiology

## 2020-02-27 ENCOUNTER — Ambulatory Visit: Payer: Managed Care, Other (non HMO) | Admitting: Cardiology

## 2020-02-27 VITALS — BP 112/76 | HR 59 | Ht 70.0 in | Wt 176.0 lb

## 2020-02-27 DIAGNOSIS — I25708 Atherosclerosis of coronary artery bypass graft(s), unspecified, with other forms of angina pectoris: Secondary | ICD-10-CM | POA: Diagnosis not present

## 2020-02-27 DIAGNOSIS — I10 Essential (primary) hypertension: Secondary | ICD-10-CM | POA: Diagnosis not present

## 2020-02-27 DIAGNOSIS — Z951 Presence of aortocoronary bypass graft: Secondary | ICD-10-CM | POA: Diagnosis not present

## 2020-02-27 DIAGNOSIS — E785 Hyperlipidemia, unspecified: Secondary | ICD-10-CM

## 2020-02-27 MED ORDER — NITROGLYCERIN 0.4 MG SL SUBL: 0.4000 mg | SUBLINGUAL_TABLET | SUBLINGUAL | 11 refills | Status: AC | PRN

## 2020-03-03 ENCOUNTER — Other Ambulatory Visit: Payer: Self-pay | Admitting: Physician Assistant

## 2020-04-02 MED ORDER — EZETIMIBE 10 MG PO TABS: 10.0000 mg | ORAL_TABLET | Freq: Every day | ORAL | 11 refills | Status: DC

## 2020-04-11 ENCOUNTER — Other Ambulatory Visit: Payer: Self-pay | Admitting: Family Medicine

## 2020-04-11 DIAGNOSIS — R748 Abnormal levels of other serum enzymes: Secondary | ICD-10-CM

## 2020-04-26 ENCOUNTER — Ambulatory Visit
Admission: RE | Admit: 2020-04-26 | Discharge: 2020-04-26 | Disposition: A | Discharge status: Home / Self Care | Payer: Managed Care, Other (non HMO) | Source: Ambulatory Visit | Attending: Family Medicine | Admitting: Family Medicine

## 2020-04-26 DIAGNOSIS — R748 Abnormal levels of other serum enzymes: Secondary | ICD-10-CM

## 2020-04-29 ENCOUNTER — Other Ambulatory Visit: Payer: Self-pay | Admitting: Cardiology

## 2020-08-30 ENCOUNTER — Other Ambulatory Visit: Payer: Self-pay | Admitting: Cardiology

## 2020-09-30 ENCOUNTER — Other Ambulatory Visit: Payer: Self-pay | Admitting: Cardiology

## 2020-11-16 NOTE — Progress Notes (Signed)
Cardiology Office Note    Date:  11/19/2020   ID:  Logan Wells, DOB 1955/07/06, MRN [id number]  PCP:  Kathyrn Lass, MD  Cardiologist:  Dr. Martinique  Chief Complaint  Patient presents with   Coronary Artery Disease    History of Present Illness:  Logan Wells is a 65 y.o. male with PMH of HTN, HLD and CAD s/p CABG. He had a Coronary CTA in December 2018.  This showed a coronary artery calcium score of 1562 Agaston units and a severe stenosis in the proximal to mid LAD and left PDA.  Possible moderate stenosis in the nondominant RCA.  Patient eventually underwent cardiac catheterization and was found to have severe multivessel disease with normal EF and underwent CABG x4 by Dr. Prescott Gum on 01/06/2017 with LIMA to LAD, SVG to diagonal, SVG to OM and SVG to PDB of distal LCx.    He was placed on aspirin, beta-blocker, statin therapy.    Unfortunately patient went back to the hospital on 03/09/2017 with recurrent chest pain.  His d-dimer was positive at 0.91.  Serial troponin negative.  TSH normal.  He eventually underwent a relook cardiac catheterization on 03/09/2017 which showed bypass graft failure with occlusion of SVG to PDA and SVG to nondominant RCA, patent SVG to diagonal, patent but  atretic appearing LIMA to the mid LAD.  90% mid LAD lesion, 50-60% ostial OM 2 lesion, 80-90% left PDA lesion, nondominant RCA with proximal 95% disease and the mid 90% disease.  Medical therapy was recommended.  30 mg daily of Imdur was added. He is on high dose Crestor  and Zetia.  He was seen in June 2019 by Almyra Deforest PA-C for chest pain. A Myoview was ordered and was normal.   He was seen at [facility] by Dr Posey Pronto in February with atypical chest pain. Myoview was ordered and showed a small fixed defect without ischemia.   On follow up today he reports he is doing well.  He is  active.  He rarely has some chest pressure.  He denies any  dyspnea.   Has not had to use sl Ntg. He is still working.    Past  Medical History:  Diagnosis Date   Arthritis    "left shoulder" (03/09/2017)   CAD in native artery 12/2016   Chest pain 11/19/2016   GERD (gastroesophageal reflux disease)    High cholesterol    Hypertension    NSTEMI (non-ST elevated myocardial infarction) ([place]) 12/2016; 03/09/2017    Past Surgical History:  Procedure Laterality Date   CARDIAC CATHETERIZATION  01/05/2017; 03/09/2017   CORONARY ARTERY BYPASS GRAFT N/A 01/06/2017   Procedure: CORONARY ARTERY BYPASS GRAFTING (CABG) x4 , USING LEFT INTERNAL MAMMARY ARTERY AND RIGHT GREATER SAPHENOUS VEIN HARVESTED ENDOSCOPICALLY;  Surgeon: Ivin Poot, MD;  Location: [place];  Service: Open Heart Surgery;  Laterality: N/A;   LEFT HEART CATH AND CORONARY ANGIOGRAPHY N/A 01/05/2017   Procedure: LEFT HEART CATH AND CORONARY ANGIOGRAPHY;  Surgeon: Martinique, Loella Hickle M, MD;  Location: [place] [facility];  Service: Cardiovascular;  Laterality: N/A;   LEFT HEART CATH AND CORS/GRAFTS ANGIOGRAPHY N/A 03/09/2017   Procedure: LEFT HEART CATH AND CORS/GRAFTS ANGIOGRAPHY;  Surgeon: Belva Crome, MD;  Location: [place] [facility];  Service: Cardiovascular;  Laterality: N/A;   SHOULDER HEMI-ARTHROPLASTY Left    "partial shoulder replacement"   TEE WITHOUT CARDIOVERSION N/A 01/06/2017   Procedure: TRANSESOPHAGEAL ECHOCARDIOGRAM (TEE);  Surgeon: Prescott Gum, Collier Salina, MD;  Location: [facility];  Service: Open Heart Surgery;  Laterality: N/A;   ULTRASOUND GUIDANCE FOR VASCULAR ACCESS  03/09/2017   Procedure: Ultrasound Guidance For Vascular Access;  Surgeon: Belva Crome, MD;  Location: [place] [facility];  Service: Cardiovascular;;    Current Medications: Outpatient Medications Prior to Visit  Medication Sig Dispense Refill   amLODipine (NORVASC) 2.5 MG tablet TAKE 1 TABLET(2.5 MG) BY MOUTH DAILY 90 tablet 3   aspirin 81 MG chewable tablet Chew 1 tablet (81 mg total) by mouth daily.     cetirizine (ZYRTEC) 10 MG tablet Take 1 tablet by mouth daily.      Cholecalciferol (VITAMIN D3 ULTRA STRENGTH) 125 MCG (5000 UT) capsule See admin instructions.     clopidogrel (PLAVIX) 75 MG tablet TAKE 1 TABLET(75 MG) BY MOUTH DAILY 90 tablet 3   Cyanocobalamin (VITAMIN B 12) 500 MCG TABS 1 tablet     ezetimibe (ZETIA) 10 MG tablet Take 1 tablet (10 mg total) by mouth daily. 30 tablet 11   fluticasone (FLONASE) 50 MCG/ACT nasal spray SHAKE LQ AND U 2 SPRAYS IEN QD     isosorbide mononitrate (IMDUR) 60 MG 24 hr tablet TAKE 1 TABLET(60 MG) BY MOUTH DAILY 90 tablet 3   metoprolol tartrate (LOPRESSOR) 25 MG tablet TAKE 1 TABLET(25 MG) BY MOUTH TWICE DAILY 180 tablet 3   rosuvastatin (CRESTOR) 40 MG tablet TAKE 1 TABLET(40 MG) BY MOUTH DAILY 90 tablet 3   scopolamine (TRANSDERM-SCOP, 1.5 MG,) 1 MG/3DAYS Place 1 patch (1.5 mg total) onto the skin every 3 (three) days. 2 patch 0   nitroGLYCERIN (NITROSTAT) 0.4 MG SL tablet Place 1 tablet (0.4 mg total) under the tongue every 5 (five) minutes as needed for chest pain. 25 tablet 11   No facility-administered medications prior to visit.     Allergies:   Lipitor [atorvastatin calcium]   Social History   Socioeconomic History   Marital status: Married    Spouse name: Not on file   Number of children: Not on file   Years of education: Not on file   Highest education level: Not on file  Occupational History   Not on file  Tobacco Use   Smoking status: Never   Smokeless tobacco: Never  Vaping Use   Vaping Use: Never used  Substance and Sexual Activity   Alcohol use: Yes    Alcohol/week: 14.0 standard drinks    Types: 14 Glasses of wine per week    Comment: daily   Drug use: No   Sexual activity: Not on file  Other Topics Concern   Not on file  Social History Narrative   Not on file   Social Determinants of Health   Financial Resource Strain: Not on file  Food Insecurity: Not on file  Transportation Needs: Not on file  Physical Activity: Not on file  Stress: Not on file  Social Connections: Not  on file     Family History:  The patient's family history includes CAD in his mother; Heart attack (age of onset: 62) in his mother; Heart disease in his mother; Multiple myeloma in his father.   ROS:   Please see the history of present illness.    ROS All other systems reviewed and are negative.   PHYSICAL EXAM:   VS:  BP 120/76   Pulse 65   Ht 5' 10" (1.778 m)   Wt 177 lb 12.8 oz (80.6 kg)   SpO2 95%   BMI 25.51 kg/m    GENERAL:  Well appearing WM in NAD HEENT:  PERRL, EOMI, sclera are clear. Oropharynx is clear. NECK:  No jugular venous distention, carotid upstroke brisk and symmetric, no bruits, no thyromegaly or adenopathy LUNGS:  Clear to auscultation bilaterally CHEST:  Unremarkable HEART:  RRR,  PMI not displaced or sustained,S1 and S2 within normal limits, no S3, no S4: no clicks, no rubs, no murmurs ABD:  Soft, nontender. BS +, no masses or bruits. No hepatomegaly, no splenomegaly EXT:  2 + pulses throughout, no edema, no cyanosis no clubbing SKIN:  Warm and dry.  No rashes NEURO:  Alert and oriented x 3. Cranial nerves II through XII intact. PSYCH:  Cognitively intact      Wt Readings from Last 3 Encounters:  11/19/20 177 lb 12.8 oz (80.6 kg)  02/27/20 176 lb (79.8 kg)  08/19/19 177 lb (80.3 kg)      Studies/Labs Reviewed:   EKG:  EKG is not ordered today.     Recent Labs: No results found for requested labs within last 8760 hours.   Lipid Panel    Component Value Date/Time   CHOL 147 07/07/2017 0809   TRIG 93 07/07/2017 0809   HDL 65 07/07/2017 0809   CHOLHDL 2.3 07/07/2017 0809   CHOLHDL 2.7 01/05/2017 0808   VLDL 16 01/05/2017 0808   LDLCALC 63 07/07/2017 0809   Dated 12/07/18: cholesterol 158, triglycerides 82, HDL 73, LDL 68. Chemistries normal Dated 01/02/20: cholesterol 161, triglycerides 92, HDL 65, LDL 79. Creatinine 1.1. otherwise CBC, TSH, CMET normal. Dated 04/05/20: creatinine 1.03. K+ 4.8.   Ecg today shows NSR with rate 59.  Normal Ecg. I have personally reviewed and interpreted this study.   Additional studies/ records that were reviewed today include:   Cath 03/09/2017 Conclusion   Bypass graft failure with occlusion of the SVG to the PDA and SVG to the first obtuse marginal.  Patent SVG to diagonal. Patent although atretic appearing LIMA to the mid LAD.  Normal left main. Total occlusion of the proximal to mid LAD.  90% ostial LAD obstruction. 50-60% obstruction in the ostial to proximal second obtuse marginal which is the dominant obtuse marginal previously bypassed. Severe diffusely diseased distal circumflex into the left PDA between 80 and 95% throughout the involved region. Nondominant right coronary with proximal 95% obstruction and mid 90% obstruction after the first acute marginal branch Normal left ventricular function.  Normal left ventricular end-diastolic pressure.   RECOMMENDATIONS:   Medical therapy.     Myoview 07/24/17: Study Highlights   Nuclear stress EF: 54%. The left ventricular ejection fraction is mildly decreased (45-54%). Visually the ejection fraction appears to be greater than 54%. The study is normal. No evidence of ischemia. No evidence of previous infarction. This is a low risk study.   Myoview 03/29/20: Impression   Myocardial perfusion imaging is Abnormal- There is a small size, moderate  grade perfusion defect at the apex, fixed between rest and stress images,  consistent with a small infarction.  No ischemia.  Stress ECG is Normal.  Summed severity score is 2.  Artifacts noted:  none.  Overall left ventricular systolic function was Normal with abnormal septal  motion consistent with previous cardiac surgery.   Interpretation By:  Carmin Richmond, MD   ASSESSMENT:    1. Coronary artery disease of bypass graft of native heart with stable angina pectoris ([place])   2. S/P CABG x 4   3. Essential hypertension   4. Hyperlipidemia, unspecified hyperlipidemia type  PLAN:  In order of problems listed above:   CAD s/p CABG: Found to have multivessel disease on coronary CT and underwent bypass surgery, unfortunately repeat cardiac catheterization showed 2 out of 4 bypass grafts with early occlusion.  The LIMA to the LAD and SVG to diagonal are patent. One SVG went to a very small nondominant RCA. The other SVG went to the left PDA. He was seen in June 2019 with some chest pain but Myoview was normal. On review of prior angiogram the LCx distally into the PDA is really diffusely diseased and small and really not a great target for PCI. He has stable class 1 angina. [facility]oview at [facility] earlier this year showed no significant ischemia. We will continue medical therapy. On Imdur, amlodipine, metoprolol.   Hypertension: well controlled.  Hyperlipidemia: on high dose statin and Zetia. Most recent lab showed mild increase in LDL to 79. Continue current therapy and lifestyle modification. He is going to have labs repeated by PCP this Dec. We discussed current guidelines aiming for LDL less than 55. May need to consider adding bempidoic acid or PCSK 9 inhibitor.    Follow up in 6 months.    Medication Adjustments/Labs and Tests Ordered: Current medicines are reviewed at length with the patient today.  Concerns regarding medicines are outlined above.  Medication changes, Labs and Tests ordered today are listed in the Patient Instructions below. There are no Patient Instructions on file for this visit.   Signed, Alexcis Bicking Martinique, MD  11/19/2020 4:19 PM    [facility]

## 2020-11-19 ENCOUNTER — Other Ambulatory Visit: Payer: Self-pay

## 2020-11-19 ENCOUNTER — Ambulatory Visit: Payer: Managed Care, Other (non HMO) | Admitting: Cardiology

## 2020-11-19 ENCOUNTER — Encounter: Payer: Self-pay | Admitting: Cardiology

## 2020-11-19 VITALS — BP 120/76 | HR 65 | Ht 70.0 in | Wt 177.8 lb

## 2020-11-19 DIAGNOSIS — I25708 Atherosclerosis of coronary artery bypass graft(s), unspecified, with other forms of angina pectoris: Secondary | ICD-10-CM | POA: Diagnosis not present

## 2020-11-19 DIAGNOSIS — E785 Hyperlipidemia, unspecified: Secondary | ICD-10-CM

## 2020-11-19 DIAGNOSIS — I10 Essential (primary) hypertension: Secondary | ICD-10-CM

## 2020-11-19 DIAGNOSIS — Z951 Presence of aortocoronary bypass graft: Secondary | ICD-10-CM

## 2021-01-30 ENCOUNTER — Other Ambulatory Visit: Payer: Self-pay | Admitting: Cardiology

## 2021-03-06 ENCOUNTER — Encounter: Payer: Self-pay | Admitting: Cardiology

## 2021-03-12 ENCOUNTER — Other Ambulatory Visit: Payer: Self-pay

## 2021-03-12 MED ORDER — ISOSORBIDE MONONITRATE ER 60 MG PO TB24: ORAL_TABLET | ORAL | 2 refills | Status: AC

## 2021-04-04 ENCOUNTER — Other Ambulatory Visit: Payer: Self-pay | Admitting: Cardiology

## 2021-04-04 MED ORDER — EZETIMIBE 10 MG PO TABS: 10.0000 mg | ORAL_TABLET | Freq: Every day | ORAL | 11 refills | Status: AC

## 2021-05-07 ENCOUNTER — Other Ambulatory Visit: Payer: Self-pay

## 2021-05-07 MED ORDER — METOPROLOL TARTRATE 25 MG PO TABS: ORAL_TABLET | ORAL | 3 refills | Status: AC

## 2021-07-09 NOTE — Progress Notes (Signed)
Cardiology Clinic Note   Patient Name: Logan Wells Date of Encounter: 07/11/2021  Primary Care Provider:  Kathyrn Lass, MD Primary Cardiologist:  Peter Martinique, MD  Patient Profile    Logan Wells 66 year old male presents to the clinic for follow-up evaluation of his coronary artery disease and hypertension.  Past Medical History    Past Medical History:  Diagnosis Date   Arthritis    "left shoulder" (03/09/2017)   CAD in native artery 12/2016   Chest pain 11/19/2016   GERD (gastroesophageal reflux disease)    High cholesterol    Hypertension    NSTEMI (non-ST elevated myocardial infarction) ([place]) 12/2016; 03/09/2017   Past Surgical History:  Procedure Laterality Date   CARDIAC CATHETERIZATION  01/05/2017; 03/09/2017   CORONARY ARTERY BYPASS GRAFT N/A 01/06/2017   Procedure: CORONARY ARTERY BYPASS GRAFTING (CABG) x4 , USING LEFT INTERNAL MAMMARY ARTERY AND RIGHT GREATER SAPHENOUS VEIN HARVESTED ENDOSCOPICALLY;  Surgeon: Ivin Poot, MD;  Location: [place];  Service: Open Heart Surgery;  Laterality: N/A;   LEFT HEART CATH AND CORONARY ANGIOGRAPHY N/A 01/05/2017   Procedure: LEFT HEART CATH AND CORONARY ANGIOGRAPHY;  Surgeon: Martinique, Peter M, MD;  Location: [place] [facility];  Service: Cardiovascular;  Laterality: N/A;   LEFT HEART CATH AND CORS/GRAFTS ANGIOGRAPHY N/A 03/09/2017   Procedure: LEFT HEART CATH AND CORS/GRAFTS ANGIOGRAPHY;  Surgeon: Belva Crome, MD;  Location: [place] [facility];  Service: Cardiovascular;  Laterality: N/A;   SHOULDER HEMI-ARTHROPLASTY Left    "partial shoulder replacement"   TEE WITHOUT CARDIOVERSION N/A 01/06/2017   Procedure: TRANSESOPHAGEAL ECHOCARDIOGRAM (TEE);  Surgeon: Prescott Gum, Collier Salina, MD;  Location: [place];  Service: Open Heart Surgery;  Laterality: N/A;   ULTRASOUND GUIDANCE FOR VASCULAR ACCESS  03/09/2017   Procedure: Ultrasound Guidance For Vascular Access;  Surgeon: Belva Crome, MD;  Location: [place] [facility];  Service:  Cardiovascular;;    Allergies  Allergies  Allergen Reactions   Lipitor [Atorvastatin Calcium]     Myalgia     History of Present Illness    [name] has a PMH of unstable angina, coronary artery disease status post CABG x4 on 12/18 (LIMA-LAD, SVG-diagonal, SVG-obtuse marginal, SVG-distal circumflex), essential hypertension, GERD, and hyperlipidemia.   He went back to the hospital on 03/09/2017 with recurrent chest discomfort.  His D-dimer was positive at 0.91.  His serial troponins were negative.  His TSH was normal.  He underwent cardiac catheterization 03/09/2017 which showed bypass graft failure and occlusion of his SVG-PDA, SVG-nondominant right, patent SVG-diagonal and patent LIMA-mid LAD.  He was noted to have 90% mid LAD lesion, 50-60% ostial OM2 lesion, 80-90% left PDA lesion, nondominant RCA with proximal 95% disease in mid 90% disease.  Medical management was recommended.  He was started on Imdur 30 mg daily.  His rosuvastatin and ezetimibe were continued.  He was seen in follow-up by Almyra Deforest, PA-C for chest pain 6/19.  He underwent stress testing which was normal.  He was seen at [facility] by Dr. Posey Pronto 2/22.  He was felt to have atypical chest pain.  A nuclear stress test was ordered and showed small fixed defect without ischemia.  He was seen in follow-up by Dr. Martinique on 11/19/2020.  During that time he was doing well.  He was active.  He was noted to have rare episodes of chest pressure.  He denied dyspnea.  He had not had to use any sublingual nitroglycerin.  He continued to work.  He presents to  the clinic today for follow-up evaluation states he feels well.  He continues to do cardiovascular exercise 4-5 times per week and doing weight training 2 times per week.  We reviewed his previous CABG and stress testing.  He reports that he is now taking Repatha which was prescribed by Dr. Posey Pronto at [facility].  He will take his second injection today.  He has no cardiac complaints at this  time.  We will plan follow-up in 9 to 12 months and I will give him heart healthy low-sodium high-fiber diet information.  Today he denies chest pain, shortness of breath, lower extremity edema, fatigue, palpitations, melena, hematuria, hemoptysis, diaphoresis, weakness, presyncope, syncope, orthopnea, and PND.     Home Medications    Prior to Admission medications   Medication Sig Start Date End Date Taking? Authorizing Provider  amLODipine (NORVASC) 2.5 MG tablet TAKE 1 TABLET(2.5 MG) BY MOUTH DAILY 01/30/21   Martinique, Peter M, MD  aspirin 81 MG chewable tablet Chew 1 tablet (81 mg total) by mouth daily. 03/10/17   Cheryln Manly, NP  cetirizine (ZYRTEC) 10 MG tablet Take 1 tablet by mouth daily.    [provider]  Cholecalciferol (VITAMIN D3 ULTRA STRENGTH) 125 MCG (5000 UT) capsule See admin instructions. 01/06/20   [provider]  clopidogrel (PLAVIX) 75 MG tablet TAKE 1 TABLET(75 MG) BY MOUTH DAILY 10/02/20   Martinique, Peter M, MD  Cyanocobalamin (VITAMIN B 12) 500 MCG TABS 1 tablet 01/06/20   [provider]  ezetimibe (ZETIA) 10 MG tablet Take 1 tablet (10 mg total) by mouth daily. 04/04/21 03/30/22  Martinique, Peter M, MD  fluticasone (FLONASE) 50 MCG/ACT nasal spray SHAKE LQ AND U 2 SPRAYS IEN QD 10/28/18   [provider]  isosorbide mononitrate (IMDUR) 60 MG 24 hr tablet TAKE 1 TABLET(60 MG) BY MOUTH DAILY 03/12/21   Martinique, Peter M, MD  metoprolol tartrate (LOPRESSOR) 25 MG tablet TAKE 1 TABLET(25 MG) BY MOUTH TWICE DAILY 05/07/21   Martinique, Peter M, MD  nitroGLYCERIN (NITROSTAT) 0.4 MG SL tablet Place 1 tablet (0.4 mg total) under the tongue every 5 (five) minutes as needed for chest pain. 02/27/20 05/27/20  Martinique, Peter M, MD  rosuvastatin (CRESTOR) 40 MG tablet TAKE 1 TABLET(40 MG) BY MOUTH DAILY 08/30/20   Martinique, Peter M, MD  scopolamine (TRANSDERM-SCOP, 1.5 MG,) 1 MG/3DAYS Place 1 patch (1.5 mg total) onto the skin every 3 (three) days. 05/27/17   Martinique,  Peter M, MD    Family History    Family History  Problem Relation Age of Onset   Heart disease Mother    Heart attack Mother 44   CAD Mother    Multiple myeloma Father    He indicated that his mother is alive. He indicated that his father is deceased. He indicated that both of his sisters are alive. He indicated that his brother is alive.  Social History    Social History   Socioeconomic History   Marital status: Married    Spouse name: Not on file   Number of children: Not on file   Years of education: Not on file   Highest education level: Not on file  Occupational History   Not on file  Tobacco Use   Smoking status: Never   Smokeless tobacco: Never  Vaping Use   Vaping Use: Never used  Substance and Sexual Activity   Alcohol use: Yes    Alcohol/week: 14.0 standard drinks of alcohol    Types: 14  Glasses of wine per week    Comment: daily   Drug use: No   Sexual activity: Not on file  Other Topics Concern   Not on file  Social History Narrative   Not on file   Social Determinants of Health   Financial Resource Strain: Not on file  Food Insecurity: Not on file  Transportation Needs: Not on file  Physical Activity: Not on file  Stress: Not on file  Social Connections: Not on file  Intimate Partner Violence: Not on file     Review of Systems    General:  No chills, fever, night sweats or weight changes.  Cardiovascular:  No chest pain, dyspnea on exertion, edema, orthopnea, palpitations, paroxysmal nocturnal dyspnea. Dermatological: No rash, lesions/masses Respiratory: No cough, dyspnea Urologic: No hematuria, dysuria Abdominal:   No nausea, vomiting, diarrhea, bright red blood per rectum, melena, or hematemesis Neurologic:  No visual changes, wkns, changes in mental status. All other systems reviewed and are otherwise negative except as noted above.  Physical Exam    VS:  BP 108/80 (BP Location: Left Arm, Patient Position: Sitting, Cuff Size: Normal)    Pulse (!) 56   Ht 5' 11"  (1.803 m)   Wt 175 lb 9.6 oz (79.7 kg)   BMI 24.49 kg/m  , BMI Body mass index is 24.49 kg/m. GEN: Well nourished, well developed, in no acute distress. HEENT: normal. Neck: Supple, no JVD, carotid bruits, or masses. Cardiac: RRR, no murmurs, rubs, or gallops. No clubbing, cyanosis, edema.  Radials/DP/PT 2+ and equal bilaterally.  Respiratory:  Respirations regular and unlabored, clear to auscultation bilaterally. GI: Soft, nontender, nondistended, BS + x 4. MS: no deformity or atrophy. Skin: warm and dry, no rash. Neuro:  Strength and sensation are intact. Psych: Normal affect.    Accessory Clinical Findings    Recent Labs: No results found for requested labs within last 365 days.   Recent Lipid Panel    Component Value Date/Time   CHOL 147 07/07/2017 0809   TRIG 93 07/07/2017 0809   HDL 65 07/07/2017 0809   CHOLHDL 2.3 07/07/2017 0809   CHOLHDL 2.7 01/05/2017 0808   VLDL 16 01/05/2017 0808   LDLCALC 63 07/07/2017 0809    ECG personally reviewed by me today-sinus bradycardia, 56 bpm- No acute changes  Echocardiogram 01/06/2017  Mitral valve: Trace regurgitation.   Septum: Atrial septal motion is hypermobile. No Patent Foramen Ovale  present.   Left atrium: Patent foramen ovale not present.   Tricuspid valve: Mild regurgitation. The tricuspid valve regurgitation  jet is central.   Right ventricle: Normal cavity size, wall thickness and ejection  fraction.   Pulmonic valve: Trace regurgitation.   Left atrium: No spontaneous echo contrast.    Cardiac catheterization 03/09/2017 Bypass graft failure with occlusion of the SVG to the PDA and SVG to the first obtuse marginal.  Patent SVG to diagonal. Patent although atretic appearing LIMA to the mid LAD.  Normal left main. Total occlusion of the proximal to mid LAD.  90% ostial LAD obstruction. 50-60% obstruction in the ostial to proximal second obtuse marginal which is the dominant  obtuse marginal previously bypassed. Severe diffusely diseased distal circumflex into the left PDA between 80 and 95% throughout the involved region. Nondominant right coronary with proximal 95% obstruction and mid 90% obstruction after the first acute marginal branch Normal left ventricular function.  Normal left ventricular end-diastolic pressure.   RECOMMENDATIONS:   Medical therapy.   Diagnostic Dominance: Left  Intervention  Assessment & Plan   1.  Coronary artery disease-no recent episodes of chest discomfort.  Status post CABG x4 2018.  Underwent cardiac catheterization 2/19 which showed bypass graft failure SVG-PDA and SVG-nondominant RCA, patent SVG-diagonal, patent LIMA-mid LAD.  Nuclear stress test at [facility] showed no significant ischemia.  Medical management recommended. Continue aspirin, rosuvastatin, amlodipine, clopidogrel, Imdur, metoprolol Heart healthy low-sodium diet-salty 6 given Increase physical activity as tolerated  Hyperlipidemia-LDL 62 on 02/15/21. Started Repatha and will have second injection today. Continue rosuvastatin, aspirin Heart healthy low-sodium high-fiber diet.   Increase physical activity as tolerated  Hypertension-BP today 108/80.  Well-controlled at home.   Continue metoprolol, amlodipine Heart healthy low-sodium diet-salty 6 given Increase physical activity as tolerated  Disposition: Follow-up with Dr. Martinique or me in 9-12 months.  Jossie Ng. Alitzel Cookson NP-C    07/11/2021, 10:02 AM [address] Office [phone] Fax [phone]  Notice: This dictation was prepared with Dragon dictation along with smaller phrase technology. Any transcriptional errors that result from this process are unintentional and may not be corrected upon review.  I spent 14 minutes examining this patient, reviewing medications, and using patient centered shared decision making involving her cardiac care.  Prior to  her visit I spent greater than 20 minutes reviewing her past medical history,  medications, and prior cardiac tests.

## 2021-07-11 ENCOUNTER — Ambulatory Visit: Payer: Managed Care, Other (non HMO) | Admitting: General Practice

## 2021-07-11 ENCOUNTER — Encounter: Payer: Self-pay | Admitting: General Practice

## 2021-07-11 VITALS — BP 108/80 | HR 56 | Ht 71.0 in | Wt 175.6 lb

## 2021-07-11 DIAGNOSIS — I10 Essential (primary) hypertension: Secondary | ICD-10-CM

## 2021-07-11 DIAGNOSIS — E785 Hyperlipidemia, unspecified: Secondary | ICD-10-CM

## 2021-07-11 DIAGNOSIS — Z951 Presence of aortocoronary bypass graft: Secondary | ICD-10-CM | POA: Diagnosis not present

## 2021-07-11 DIAGNOSIS — I25708 Atherosclerosis of coronary artery bypass graft(s), unspecified, with other forms of angina pectoris: Secondary | ICD-10-CM | POA: Diagnosis not present

## 2021-07-11 NOTE — Patient Instructions (Signed)
Medication Instructions:  The current medical regimen is effective;  continue present plan and medications as directed. Please refer to the Current Medication list given to you today.   *If you need a refill on your cardiac medications before your next appointment, please call your pharmacy*  Lab Work:   Testing/Procedures:  NONE    NONE If you have labs (blood work) drawn today and your tests are completely normal, you will receive your results only by: MyChart Message (if you have MyChart) OR  A paper copy in the mail If you have any lab test that is abnormal or we need to change your treatment, we will call you to review the results.  Special Instructions PLEASE FOLLOW HEART HEALTHY LOW SALT DIET-ATTACHED  Follow-Up: Your next appointment:  9-12 month(s) In Person with Peter [place], MD   Please call our office 2 months in advance to schedule this appointment   At [facility], you and your health needs are our priority.  As part of our continuing mission to provide you with exceptional heart care, we have created designated Provider Care Teams.  These Care Teams include your primary Cardiologist (physician) and Advanced Practice Providers (APPs -  Physician Assistants and Nurse Practitioners) who all work together to provide you with the care you need, when you need it.    Important Information About Sugar       Heart-Healthy Eating Plan Heart-healthy meal planning includes: Eating less unhealthy fats. Eating more healthy fats. Making other changes in your diet. Talk with your doctor or a diet specialist (dietitian) to create an eating plan that is right for you. What is my plan?  What are tips for following this plan? Cooking Avoid frying your food. Try to bake, boil, grill, or broil it instead. You can also reduce fat by: Removing the skin from poultry. Removing all visible fats from meats. Steaming vegetables in water or broth. Meal planning  At meals, divide  your plate into four equal parts: Fill one-half of your plate with vegetables and green salads. Fill one-fourth of your plate with whole grains. Fill one-fourth of your plate with lean protein foods. Eat 4-5 servings of vegetables per day. A serving of vegetables is: 1 cup of raw or cooked vegetables. 2 cups of raw leafy greens. Eat 4-5 servings of fruit per day. A serving of fruit is: 1 medium whole fruit.  cup of dried fruit.  cup of fresh, frozen, or canned fruit.  cup of 100% fruit juice. Eat more foods that have soluble fiber. These are apples, broccoli, carrots, beans, peas, and barley. Try to get 20-30 g of fiber per day. Eat 4-5 servings of nuts, legumes, and seeds per week: 1 serving of dried beans or legumes equals  cup after being cooked. 1 serving of nuts is  cup. 1 serving of seeds equals 1 tablespoon. General information Eat more home-cooked food. Eat less restaurant, buffet, and fast food. Limit or avoid alcohol. Limit foods that are high in starch and sugar. Avoid fried foods. Lose weight if you are overweight. Keep track of how much salt (sodium) you eat. This is important if you have high blood pressure. Ask your doctor to tell you more about this. Try to add vegetarian meals each week. Fats Choose healthy fats. These include olive oil and canola oil, flaxseeds, walnuts, almonds, and seeds. Eat more omega-3 fats. These include salmon, mackerel, sardines, tuna, flaxseed oil, and ground flaxseeds. Try to eat fish at least 2 times each  week. Check food labels. Avoid foods with trans fats or high amounts of saturated fat. Limit saturated fats. These are often found in animal products, such as meats, butter, and cream. These are also found in plant foods, such as palm oil, palm kernel oil, and coconut oil. Avoid foods with partially hydrogenated oils in them. These have trans fats. Examples are stick margarine, some tub margarines, cookies, crackers, and other baked  goods. What foods can I eat? Fruits All fresh, canned (in natural juice), or frozen fruits. Vegetables Fresh or frozen vegetables (raw, steamed, roasted, or grilled). Green salads. Grains Most grains. Choose whole wheat and whole grains most of the time. Rice and pasta, including brown rice and pastas made with whole wheat. Meats and other proteins Lean, well-trimmed beef, veal, pork, and lamb. Chicken and [place] without skin. All fish and shellfish. Wild duck, rabbit, pheasant, and venison. Egg whites or low-cholesterol egg substitutes. Dried beans, peas, lentils, and tofu. Seeds and most nuts. Dairy Low-fat or nonfat cheeses, including ricotta and mozzarella. Skim or 1% milk that is liquid, powdered, or evaporated. Buttermilk that is made with low-fat milk. Nonfat or low-fat yogurt. Fats and oils Non-hydrogenated (trans-free) margarines. Vegetable oils, including soybean, sesame, sunflower, olive, peanut, safflower, corn, canola, and cottonseed. Salad dressings or mayonnaise made with a vegetable oil. Beverages Mineral water. Coffee and tea. Diet carbonated beverages. Sweets and desserts Sherbet, gelatin, and fruit ice. Small amounts of dark chocolate. Limit all sweets and desserts. Seasonings and condiments All seasonings and condiments. The items listed above may not be a complete list of foods and drinks you can eat. Contact a dietitian for more options. What foods should I avoid? Fruits Canned fruit in heavy syrup. Fruit in cream or butter sauce. Fried fruit. Limit coconut. Vegetables Vegetables cooked in cheese, cream, or butter sauce. Fried vegetables. Grains Breads that are made with saturated or trans fats, oils, or whole milk. Croissants. Sweet rolls. Donuts. High-fat crackers, such as cheese crackers. Meats and other proteins Fatty meats, such as hot dogs, ribs, sausage, bacon, rib-eye roast or steak. High-fat deli meats, such as salami and bologna. Caviar. Domestic duck and  goose. Organ meats, such as liver. Dairy Cream, sour cream, cream cheese, and creamed cottage cheese. Whole-milk cheeses. Whole or 2% milk that is liquid, evaporated, or condensed. Whole buttermilk. Cream sauce or high-fat cheese sauce. Yogurt that is made from whole milk. Fats and oils Meat fat, or shortening. Cocoa butter, hydrogenated oils, palm oil, coconut oil, palm kernel oil. Solid fats and shortenings, including bacon fat, salt pork, lard, and butter. Nondairy cream substitutes. Salad dressings with cheese or sour cream. Beverages Regular sodas and juice drinks with added sugar. Sweets and desserts Frosting. Pudding. Cookies. Cakes. Pies. Milk chocolate or white chocolate. Buttered syrups. Full-fat ice cream or ice cream drinks. The items listed above may not be a complete list of foods and drinks to avoid. Contact a dietitian for more information. Summary Heart-healthy meal planning includes eating less unhealthy fats, eating more healthy fats, and making other changes in your diet. Eat a balanced diet. This includes fruits and vegetables, low-fat or nonfat dairy, lean protein, nuts and legumes, whole grains, and heart-healthy oils and fats. This information is not intended to replace advice given to you by your health care provider. Make sure you discuss any questions you have with your health care provider. Document Revised: 05/24/2020 Document Reviewed: 05/24/2020 Elsevier Patient Education  2022 [employer].

## 2021-09-06 ENCOUNTER — Other Ambulatory Visit: Payer: Self-pay

## 2021-09-06 MED ORDER — ROSUVASTATIN CALCIUM 40 MG PO TABS
ORAL_TABLET | ORAL | 3 refills | Status: AC
Start: 2021-09-06 — End: ?

## 2021-10-16 ENCOUNTER — Ambulatory Visit: Payer: Managed Care, Other (non HMO) | Admitting: Cardiology

## 2021-11-14 ENCOUNTER — Other Ambulatory Visit: Payer: Self-pay | Admitting: Cardiology

## 2022-02-25 DIAGNOSIS — Z0000 Encounter for general adult medical examination without abnormal findings: Secondary | ICD-10-CM | POA: Diagnosis not present

## 2022-05-20 NOTE — Progress Notes (Unsigned)
Cardiology Clinic Note   Patient Name: Logan Wells Date of Encounter: 05/20/2022  Primary Care Provider:  Sigmund Hazel, MD Primary Cardiologist:  Peter Swaziland, MD  Patient Profile    Logan Wells 67 year old male presents to the clinic for follow-up evaluation of his coronary artery disease and hypertension.  Past Medical History    Past Medical History:  Diagnosis Date   Arthritis    "left shoulder" (03/09/2017)   CAD in native artery 12/2016   Chest pain 11/19/2016   GERD (gastroesophageal reflux disease)    High cholesterol    Hypertension    NSTEMI (non-ST elevated myocardial infarction) (HCC) 12/2016; 03/09/2017   Past Surgical History:  Procedure Laterality Date   CARDIAC CATHETERIZATION  01/05/2017; 03/09/2017   CORONARY ARTERY BYPASS GRAFT N/A 01/06/2017   Procedure: CORONARY ARTERY BYPASS GRAFTING (CABG) x4 , USING LEFT INTERNAL MAMMARY ARTERY AND RIGHT GREATER SAPHENOUS VEIN HARVESTED ENDOSCOPICALLY;  Surgeon: Kerin Perna, MD;  Location: [facility] OR;  Service: Open Heart Surgery;  Laterality: N/A;   LEFT HEART CATH AND CORONARY ANGIOGRAPHY N/A 01/05/2017   Procedure: LEFT HEART CATH AND CORONARY ANGIOGRAPHY;  Surgeon: Swaziland, Peter M, MD;  Location: [facility] INVASIVE CV LAB;  Service: Cardiovascular;  Laterality: N/A;   LEFT HEART CATH AND CORS/GRAFTS ANGIOGRAPHY N/A 03/09/2017   Procedure: LEFT HEART CATH AND CORS/GRAFTS ANGIOGRAPHY;  Surgeon: Lyn Records, MD;  Location: [facility] INVASIVE CV LAB;  Service: Cardiovascular;  Laterality: N/A;   SHOULDER HEMI-ARTHROPLASTY Left    "partial shoulder replacement"   TEE WITHOUT CARDIOVERSION N/A 01/06/2017   Procedure: TRANSESOPHAGEAL ECHOCARDIOGRAM (TEE);  Surgeon: Donata Clay, Theron Arista, MD;  Location: [facility] OR;  Service: Open Heart Surgery;  Laterality: N/A;   ULTRASOUND GUIDANCE FOR VASCULAR ACCESS  03/09/2017   Procedure: Ultrasound Guidance For Vascular Access;  Surgeon: Lyn Records, MD;  Location: [facility] INVASIVE CV LAB;  Service:  Cardiovascular;;    Allergies  Allergies  Allergen Reactions   Lipitor [Atorvastatin Calcium]     Myalgia     History of Present Illness    Logan Wells has a PMH of unstable angina, coronary artery disease status post CABG x4 on 12/18 (LIMA-LAD, SVG-diagonal, SVG-obtuse marginal, SVG-distal circumflex), essential hypertension, GERD, and hyperlipidemia.   He went back to the hospital on 03/09/2017 with recurrent chest discomfort.  His D-dimer was positive at 0.91.  His serial troponins were negative.  His TSH was normal.  He underwent cardiac catheterization 03/09/2017 which showed bypass graft failure and occlusion of his SVG-PDA, SVG-nondominant right, patent SVG-diagonal and patent LIMA-mid LAD.  He was noted to have 90% mid LAD lesion, 50-60% ostial OM2 lesion, 80-90% left PDA lesion, nondominant RCA with proximal 95% disease in mid 90% disease.  Medical management was recommended.  He was started on Imdur 30 mg daily.  His rosuvastatin and ezetimibe were continued.  He was seen in follow-up by Azalee Course, PA-C for chest pain 6/19.  He underwent stress testing which was normal.  He was seen at [facility] by Dr. Allena Katz 2/22.  He was felt to have atypical chest pain.  A nuclear stress test was ordered and showed small fixed defect without ischemia.  He was seen in follow-up by Dr. Swaziland on 11/19/2020.  During that time he was doing well.  He was active.  He was noted to have rare episodes of chest pressure.  He denied dyspnea.  He had not had to use any sublingual nitroglycerin.  He continued to work.  He presented to  the clinic 07/11/21 for follow-up evaluation stated he felt well.  He continued to do cardiovascular exercise 4-5 times per week and doing weight training 2 times per week.  We reviewed his previous CABG and stress testing.  He reported that he was  taking Repatha which was prescribed by Dr. Allena Katz at [facility].  He had no cardiac complaints at the time.  I planned follow-up in 9 to 12 months  and gave him the heart healthy low-sodium high-fiber diet information.  He presents to the clinic today for follow-up evaluation and states***.  Today he denies chest pain, shortness of breath, lower extremity edema, fatigue, palpitations, melena, hematuria, hemoptysis, diaphoresis, weakness, presyncope, syncope, orthopnea, and PND.   Coronary artery disease-denies chest pain today.  He is status post CABG x4 2018.  Underwent cardiac catheterization 2/19 which showed bypass graft failure SVG-PDA and SVG-nondominant RCA, patent SVG-diagonal, patent LIMA-mid LAD.  Nuclear stress test at [facility] showed no significant ischemia.  Medical management was recommended. Continue aspirin, rosuvastatin, amlodipine, clopidogrel, Imdur, metoprolol Heart healthy low-sodium diet-salty 6 reviewed Maintain physical activity  Hyperlipidemia-LDL 62***on 02/15/21. Started Repatha (6/23) . Continue rosuvastatin, aspirin, Repatha Heart healthy low-sodium high-fiber diet.     Hypertension-BP today***108/80.  Continue metoprolol, amlodipine Heart healthy low-sodium diet Increase physical activity as tolerated  Disposition: Follow-up with Dr. Swaziland or me in 12 months.   Home Medications    Prior to Admission medications   Medication Sig Start Date End Date Taking? Authorizing Provider  amLODipine (NORVASC) 2.5 MG tablet TAKE 1 TABLET(2.5 MG) BY MOUTH DAILY 01/30/21   Swaziland, Peter M, MD  aspirin 81 MG chewable tablet Chew 1 tablet (81 mg total) by mouth daily. 03/10/17   Arty Baumgartner, NP  cetirizine (ZYRTEC) 10 MG tablet Take 1 tablet by mouth daily.    [provider]  Cholecalciferol (VITAMIN D3 ULTRA STRENGTH) 125 MCG (5000 UT) capsule See admin instructions. 01/06/20   [provider]  clopidogrel (PLAVIX) 75 MG tablet TAKE 1 TABLET(75 MG) BY MOUTH DAILY 10/02/20   Swaziland, Peter M, MD  Cyanocobalamin (VITAMIN B 12) 500 MCG TABS 1 tablet 01/06/20   [provider]  ezetimibe  (ZETIA) 10 MG tablet Take 1 tablet (10 mg total) by mouth daily. 04/04/21 03/30/22  Swaziland, Peter M, MD  fluticasone (FLONASE) 50 MCG/ACT nasal spray SHAKE LQ AND U 2 SPRAYS IEN QD 10/28/18   [provider]  isosorbide mononitrate (IMDUR) 60 MG 24 hr tablet TAKE 1 TABLET(60 MG) BY MOUTH DAILY 03/12/21   Swaziland, Peter M, MD  metoprolol tartrate (LOPRESSOR) 25 MG tablet TAKE 1 TABLET(25 MG) BY MOUTH TWICE DAILY 05/07/21   Swaziland, Peter M, MD  nitroGLYCERIN (NITROSTAT) 0.4 MG SL tablet Place 1 tablet (0.4 mg total) under the tongue every 5 (five) minutes as needed for chest pain. 02/27/20 05/27/20  Swaziland, Peter M, MD  rosuvastatin (CRESTOR) 40 MG tablet TAKE 1 TABLET(40 MG) BY MOUTH DAILY 08/30/20   Swaziland, Peter M, MD  scopolamine (TRANSDERM-SCOP, 1.5 MG,) 1 MG/3DAYS Place 1 patch (1.5 mg total) onto the skin every 3 (three) days. 05/27/17   Swaziland, Peter M, MD    Family History    Family History  Problem Relation Age of Onset   Heart disease Mother    Heart attack Mother 89   CAD Mother    Multiple myeloma Father    He indicated that his mother is alive. He indicated that his father is deceased. He indicated that both of his  sisters are alive. He indicated that his brother is alive.  Social History    Social History   Socioeconomic History   Marital status: Married    Spouse name: Not on file   Number of children: Not on file   Years of education: Not on file   Highest education level: Not on file  Occupational History   Not on file  Tobacco Use   Smoking status: Never   Smokeless tobacco: Never  Vaping Use   Vaping Use: Never used  Substance and Sexual Activity   Alcohol use: Yes    Alcohol/week: 14.0 standard drinks of alcohol    Types: 14 Glasses of wine per week    Comment: daily   Drug use: No   Sexual activity: Not on file  Other Topics Concern   Not on file  Social History Narrative   Not on file   Social Determinants of Health   Financial Resource Strain: Not on  file  Food Insecurity: Not on file  Transportation Needs: Not on file  Physical Activity: Not on file  Stress: Not on file  Social Connections: Not on file  Intimate Partner Violence: Not on file     Review of Systems    General:  No chills, fever, night sweats or weight changes.  Cardiovascular:  No chest pain, dyspnea on exertion, edema, orthopnea, palpitations, paroxysmal nocturnal dyspnea. Dermatological: No rash, lesions/masses Respiratory: No cough, dyspnea Urologic: No hematuria, dysuria Abdominal:   No nausea, vomiting, diarrhea, bright red blood per rectum, melena, or hematemesis Neurologic:  No visual changes, wkns, changes in mental status. All other systems reviewed and are otherwise negative except as noted above.  Physical Exam    VS:  There were no vitals taken for this visit. , BMI There is no height or weight on file to calculate BMI. GEN: Well nourished, well developed, in no acute distress. HEENT: normal. Neck: Supple, no JVD, carotid bruits, or masses. Cardiac: RRR, no murmurs, rubs, or gallops. No clubbing, cyanosis, edema.  Radials/DP/PT 2+ and equal bilaterally.  Respiratory:  Respirations regular and unlabored, clear to auscultation bilaterally. GI: Soft, nontender, nondistended, BS + x 4. MS: no deformity or atrophy. Skin: warm and dry, no rash. Neuro:  Strength and sensation are intact. Psych: Normal affect.    Accessory Clinical Findings    Recent Labs: No results found for requested labs within last 365 days.   Recent Lipid Panel    Component Value Date/Time   CHOL 147 07/07/2017 0809   TRIG 93 07/07/2017 0809   HDL 65 07/07/2017 0809   CHOLHDL 2.3 07/07/2017 0809   CHOLHDL 2.7 01/05/2017 0808   VLDL 16 01/05/2017 0808   LDLCALC 63 07/07/2017 0809    ECG personally reviewed by me today-***  EKG 07/11/2021 sinus bradycardia, 56 bpm- No acute changes  Echocardiogram 01/06/2017  Mitral valve: Trace regurgitation.   Septum: Atrial  septal motion is hypermobile. No Patent Foramen Ovale  present.   Left atrium: Patent foramen ovale not present.   Tricuspid valve: Mild regurgitation. The tricuspid valve regurgitation  jet is central.   Right ventricle: Normal cavity size, wall thickness and ejection  fraction.   Pulmonic valve: Trace regurgitation.   Left atrium: No spontaneous echo contrast.    Cardiac catheterization 03/09/2017 Bypass graft failure with occlusion of the SVG to the PDA and SVG to the first obtuse marginal.  Patent SVG to diagonal. Patent although atretic appearing LIMA to the mid LAD.  Normal left  main. Total occlusion of the proximal to mid LAD.  90% ostial LAD obstruction. 50-60% obstruction in the ostial to proximal second obtuse marginal which is the dominant obtuse marginal previously bypassed. Severe diffusely diseased distal circumflex into the left PDA between 80 and 95% throughout the involved region. Nondominant right coronary with proximal 95% obstruction and mid 90% obstruction after the first acute marginal branch Normal left ventricular function.  Normal left ventricular end-diastolic pressure.   RECOMMENDATIONS:   Medical therapy.   Diagnostic Dominance: Left  Intervention   Assessment & Plan   1.  ***  Thomasene Ripple. Deari Sessler NP-C    05/20/2022, 7:44 AM [facility] [address] Office [phone] Fax [phone]  Notice: This dictation was prepared with Dragon dictation along with smaller phrase technology. Any transcriptional errors that result from this process are unintentional and may not be corrected upon review.  I spent 14***minutes examining this patient, reviewing medications, and using patient centered shared decision making involving her cardiac care.  Prior to her visit I spent greater than 20 minutes reviewing her past medical history,  medications, and prior cardiac tests.

## 2022-05-22 ENCOUNTER — Encounter: Payer: Self-pay | Admitting: General Practice

## 2022-05-22 ENCOUNTER — Ambulatory Visit: Payer: BC Managed Care – PPO | Attending: General Practice | Admitting: General Practice

## 2022-05-22 VITALS — BP 124/76 | HR 64 | Ht 71.0 in | Wt 180.2 lb

## 2022-05-22 DIAGNOSIS — E785 Hyperlipidemia, unspecified: Secondary | ICD-10-CM

## 2022-05-22 DIAGNOSIS — I25708 Atherosclerosis of coronary artery bypass graft(s), unspecified, with other forms of angina pectoris: Secondary | ICD-10-CM | POA: Diagnosis not present

## 2022-05-22 DIAGNOSIS — Z951 Presence of aortocoronary bypass graft: Secondary | ICD-10-CM

## 2022-05-22 DIAGNOSIS — I10 Essential (primary) hypertension: Secondary | ICD-10-CM

## 2022-05-22 NOTE — Patient Instructions (Signed)
Medication Instructions:  The current medical regimen is effective;  continue present plan and medications as directed. Please refer to the Current Medication list given to you today. *If you need a refill on your cardiac medications before your next appointment, please call your pharmacy*  Lab Work: NONE If you have labs (blood work) drawn today and your tests are completely normal, you will receive your results only by: MyChart Message (if you have MyChart) OR A paper copy in the mail If you have any lab test that is abnormal or we need to change your treatment, we will call you to review the results.  Other Instructions CONTINUE YOUR DIET  MAINTAIN YOUR PHYSICAL ACTIVITY   Follow-Up: At [facility], you and your health needs are our priority.  As part of our continuing mission to provide you with exceptional heart care, we have created designated Provider Care Teams.  These Care Teams include your primary Cardiologist (physician) and Advanced Practice Providers (APPs -  Physician Assistants and Nurse Practitioners) who all work together to provide you with the care you need, when you need it.  Your next appointment:   12 month(s) MAKE SURE TO CALL 2 MONTHS IN ADVANCE TO MAKE YOUR APPOINTMENT WITH DR Swaziland ONLY  Provider:   Peter Swaziland, MD

## 2022-06-11 DIAGNOSIS — N8111 Cystocele, midline: Secondary | ICD-10-CM | POA: Diagnosis not present

## 2022-06-11 DIAGNOSIS — N898 Other specified noninflammatory disorders of vagina: Secondary | ICD-10-CM | POA: Diagnosis not present

## 2022-07-01 DIAGNOSIS — I25118 Atherosclerotic heart disease of native coronary artery with other forms of angina pectoris: Secondary | ICD-10-CM | POA: Diagnosis not present

## 2022-07-01 DIAGNOSIS — E785 Hyperlipidemia, unspecified: Secondary | ICD-10-CM | POA: Diagnosis not present

## 2022-09-02 DIAGNOSIS — Z79899 Other long term (current) drug therapy: Secondary | ICD-10-CM | POA: Diagnosis not present

## 2022-09-02 DIAGNOSIS — M67911 Unspecified disorder of synovium and tendon, right shoulder: Secondary | ICD-10-CM | POA: Diagnosis not present

## 2022-09-02 DIAGNOSIS — R200 Anesthesia of skin: Secondary | ICD-10-CM | POA: Diagnosis not present

## 2022-09-02 DIAGNOSIS — M19011 Primary osteoarthritis, right shoulder: Secondary | ICD-10-CM | POA: Diagnosis not present

## 2022-09-02 DIAGNOSIS — M19012 Primary osteoarthritis, left shoulder: Secondary | ICD-10-CM | POA: Diagnosis not present

## 2022-09-02 DIAGNOSIS — M25511 Pain in right shoulder: Secondary | ICD-10-CM | POA: Diagnosis not present

## 2023-02-03 DIAGNOSIS — I10 Essential (primary) hypertension: Secondary | ICD-10-CM | POA: Diagnosis not present

## 2023-02-03 DIAGNOSIS — I25118 Atherosclerotic heart disease of native coronary artery with other forms of angina pectoris: Secondary | ICD-10-CM | POA: Diagnosis not present

## 2023-03-10 DIAGNOSIS — E7800 Pure hypercholesterolemia, unspecified: Secondary | ICD-10-CM | POA: Diagnosis not present

## 2023-03-10 DIAGNOSIS — I25119 Atherosclerotic heart disease of native coronary artery with unspecified angina pectoris: Secondary | ICD-10-CM | POA: Diagnosis not present

## 2023-03-10 DIAGNOSIS — R7303 Prediabetes: Secondary | ICD-10-CM | POA: Diagnosis not present

## 2023-03-10 DIAGNOSIS — Z0000 Encounter for general adult medical examination without abnormal findings: Secondary | ICD-10-CM | POA: Diagnosis not present

## 2023-03-10 DIAGNOSIS — Z23 Encounter for immunization: Secondary | ICD-10-CM | POA: Diagnosis not present

## 2023-03-10 DIAGNOSIS — Z125 Encounter for screening for malignant neoplasm of prostate: Secondary | ICD-10-CM | POA: Diagnosis not present

## 2023-04-29 ENCOUNTER — Ambulatory Visit: Payer: BC Managed Care – PPO | Admitting: General Practice

## 2023-05-27 NOTE — Progress Notes (Signed)
 Cardiology Clinic Note   Patient Name: Logan Wells Date of Encounter: 05/28/2023  Primary Care Provider:  Perley Bradley, MD Primary Cardiologist:  Peter Swaziland, MD  Patient Profile    Logan Wells 68 year old male presents to the clinic for follow-up evaluation of his coronary artery disease and hypertension.  Past Medical History    Past Medical History:  Diagnosis Date   Arthritis    "left shoulder" (03/09/2017)   CAD in native artery 12/2016   Chest pain 11/19/2016   GERD (gastroesophageal reflux disease)    High cholesterol    Hypertension    NSTEMI (non-ST elevated myocardial infarction) (HCC) 12/2016; 03/09/2017   Past Surgical History:  Procedure Laterality Date   CARDIAC CATHETERIZATION  01/05/2017; 03/09/2017   CORONARY ARTERY BYPASS GRAFT N/A 01/06/2017   Procedure: CORONARY ARTERY BYPASS GRAFTING (CABG) x4 , USING LEFT INTERNAL MAMMARY ARTERY AND RIGHT GREATER SAPHENOUS VEIN HARVESTED ENDOSCOPICALLY;  Surgeon: Heriberto London, MD;  Location: [facility] OR;  Service: Open Heart Surgery;  Laterality: N/A;   LEFT HEART CATH AND CORONARY ANGIOGRAPHY N/A 01/05/2017   Procedure: LEFT HEART CATH AND CORONARY ANGIOGRAPHY;  Surgeon: Swaziland, Peter M, MD;  Location: [facility] INVASIVE CV LAB;  Service: Cardiovascular;  Laterality: N/A;   LEFT HEART CATH AND CORS/GRAFTS ANGIOGRAPHY N/A 03/09/2017   Procedure: LEFT HEART CATH AND CORS/GRAFTS ANGIOGRAPHY;  Surgeon: Arty Binning, MD;  Location: [facility] INVASIVE CV LAB;  Service: Cardiovascular;  Laterality: N/A;   SHOULDER HEMI-ARTHROPLASTY Left    "partial shoulder replacement"   TEE WITHOUT CARDIOVERSION N/A 01/06/2017   Procedure: TRANSESOPHAGEAL ECHOCARDIOGRAM (TEE);  Surgeon: Matt Song, Donata Fryer, MD;  Location: [facility] OR;  Service: Open Heart Surgery;  Laterality: N/A;   ULTRASOUND GUIDANCE FOR VASCULAR ACCESS  03/09/2017   Procedure: Ultrasound Guidance For Vascular Access;  Surgeon: Arty Binning, MD;  Location: [facility] INVASIVE CV LAB;  Service:  Cardiovascular;;    Allergies  Allergies  Allergen Reactions   Lipitor  [Atorvastatin  Calcium ]     Myalgia     History of Present Illness    Logan Wells has a PMH of unstable angina, coronary artery disease status post CABG x4 on 12/18 (LIMA-LAD, SVG-diagonal, SVG-obtuse marginal, SVG-distal circumflex), essential hypertension, GERD, and hyperlipidemia.   He went back to the hospital on 03/09/2017 with recurrent chest discomfort.  His D-dimer was positive at 0.91.  His serial troponins were negative.  His TSH was normal.  He underwent cardiac catheterization 03/09/2017 which showed bypass graft failure and occlusion of his SVG-PDA, SVG-nondominant right, patent SVG-diagonal and patent LIMA-mid LAD.  He was noted to have 90% mid LAD lesion, 50-60% ostial OM2 lesion, 80-90% left PDA lesion, nondominant RCA with proximal 95% disease in mid 90% disease.  Medical management was recommended.  He was started on Imdur  30 mg daily.  His rosuvastatin  and ezetimibe  were continued.  He was seen in follow-up by Ervin Heath, PA-C for chest pain 6/19.  He underwent stress testing which was normal.  He was seen at [facility] by Dr. Lydia Sams 2/22.  He was felt to have atypical chest pain.  A nuclear stress test was ordered and showed small fixed defect without ischemia.  He was seen in follow-up by Dr. Swaziland on 11/19/2020.  During that time he was doing well.  He was active.  He was noted to have rare episodes of chest pressure.  He denied dyspnea.  He had not had to use any sublingual nitroglycerin .  He continued to work.  He presented to  the clinic 07/11/21 for follow-up evaluation stated he felt well.  He continued to do cardiovascular exercise 4-5 times per week and doing weight training 2 times per week.  We reviewed his previous CABG and stress testing.  He reported that he was  taking Repatha which was prescribed by Dr. Lydia Sams at [facility].  He had no cardiac complaints at the time.  I planned follow-up in 9 to 12 months  and gave him the heart healthy low-sodium high-fiber diet information.   05/22/22- For follow-up evaluation and stated he continues to do well.  He was exercising 4 days/week and doing resistance training 2 days/week.  He reported occasional episodes of chest discomfort after activity.  He denied chest pain during activity.  He did note some increased work of breathing with resistance training but not with cardiovascular type activity.  His  LDL was 14 while being on Repatha.  Follow-up in 12 months was planned.  He presents to the clinic today for follow-up evaluation and states he is doing well.  He continues to exercise 4 days/week and do resistance training as well.  He has not had any chest discomfort.  He does report occasional dietary indiscretion.  We reviewed his most recent lipid panel.  He is tolerating his medications well.  His blood pressure today is 108/80.  He is no longer taking amlodipine .  I will remove this from his medication list.  Will plan follow-up in 12 months.  Today he denies chest pain, shortness of breath, lower extremity edema, fatigue, palpitations, melena, hematuria, hemoptysis, diaphoresis, weakness, presyncope, syncope, orthopnea, and PND.      Home Medications    Prior to Admission medications   Medication Sig Start Date End Date Taking? Authorizing Provider  amLODipine  (NORVASC ) 2.5 MG tablet TAKE 1 TABLET(2.5 MG) BY MOUTH DAILY 01/30/21   Swaziland, Peter M, MD  aspirin  81 MG chewable tablet Chew 1 tablet (81 mg total) by mouth daily. 03/10/17   Sanjuanita Cruz, NP  cetirizine (ZYRTEC) 10 MG tablet Take 1 tablet by mouth daily.    [provider]  Cholecalciferol (VITAMIN D3 ULTRA STRENGTH) 125 MCG (5000 UT) capsule See admin instructions. 01/06/20   [provider]  clopidogrel  (PLAVIX ) 75 MG tablet TAKE 1 TABLET(75 MG) BY MOUTH DAILY 10/02/20   Swaziland, Peter M, MD  Cyanocobalamin (VITAMIN B 12) 500 MCG TABS 1 tablet 01/06/20   [provider]  ezetimibe  (ZETIA ) 10 MG tablet Take 1 tablet (10 mg total) by mouth daily. 04/04/21 03/30/22  Swaziland, Peter M, MD  fluticasone (FLONASE) 50 MCG/ACT nasal spray SHAKE LQ AND U 2 SPRAYS IEN QD 10/28/18   [provider]  isosorbide  mononitrate (IMDUR ) 60 MG 24 hr tablet TAKE 1 TABLET(60 MG) BY MOUTH DAILY 03/12/21   Swaziland, Peter M, MD  metoprolol  tartrate (LOPRESSOR ) 25 MG tablet TAKE 1 TABLET(25 MG) BY MOUTH TWICE DAILY 05/07/21   Swaziland, Peter M, MD  nitroGLYCERIN  (NITROSTAT ) 0.4 MG SL tablet Place 1 tablet (0.4 mg total) under the tongue every 5 (five) minutes as needed for chest pain. 02/27/20 05/27/20  Swazil[place], Peter M, MD  rosuvastatin  (CRESTOR ) 40 MG tablet TAKE 1 TABLET(40 MG) BY MOUTH DAILY 08/30/20   Swaziland, Peter M, MD  scopolamine  (TRANSDERM-SCOP, 1.5 MG,) 1 MG/3DAYS Place 1 patch (1.5 mg total) onto the skin every 3 (three) days. 05/27/17   [place]aziland, Peter M, MD    Family History    Family History  Problem Relation Age of Onset  Heart disease Mother    Heart attack Mother 13   CAD Mother    Multiple myeloma Father    He indicated that his mother is alive. He indicated that his father is deceased. He indicated that both of his sisters are alive. He indicated that his brother is alive.  Social History    Social History   Socioeconomic History   Marital status: Married    Spouse name: Not on file   Number of children: Not on file   Years of education: Not on file   Highest education level: Not on file  Occupational History   Not on file  Tobacco Use   Smoking status: Never   Smokeless tobacco: Never  Vaping Use   Vaping status: Never Used  Substance and Sexual Activity   Alcohol use: Yes    Alcohol/week: 14.0 standard drinks of alcohol    Types: 14 Glasses of wine per week    Comment: daily   Drug use: No   Sexual activity: Not on file  Other Topics Concern   Not on file  Social History Narrative   Not on file   Social Drivers of Health    Financial Resource Strain: Not on file  Food Insecurity: Not on file  Transportation Needs: Not on file  Physical Activity: Not on file  Stress: Not on file  Social Connections: Not on file  Intimate Partner Violence: Not on file     Review of Systems    General:  No chills, fever, night sweats or weight changes.  Cardiovascular:  No chest pain, dyspnea on exertion, edema, orthopnea, palpitations, paroxysmal nocturnal dyspnea. Dermatological: No rash, lesions/masses Respiratory: No cough, dyspnea Urologic: No hematuria, dysuria Abdominal:   No nausea, vomiting, diarrhea, bright red blood per rectum, melena, or hematemesis Neurologic:  No visual changes, wkns, changes in mental status. All other systems reviewed and are otherwise negative except as noted above.  Physical Exam    VS:  BP 108/80   Pulse (!) 54   Ht 5\' 11"  (1.803 m)   Wt 179 lb 12.8 oz (81.6 kg)   SpO2 95%   BMI 25.08 kg/m  , BMI Body mass index is 25.08 kg/m. GEN: Well nourished, well developed, in no acute distress. HEENT: normal. Neck: Supple, no JVD, carotid bruits, or masses. Cardiac: RRR, no murmurs, rubs, or gallops. No clubbing, cyanosis, edema.  Radials/DP/PT 2+ and equal bilaterally.  Respiratory:  Respirations regular and unlabored, clear to auscultation bilaterally. GI: Soft, nontender, nondistended, BS + x 4. MS: no deformity or atrophy. Skin: warm and dry, no rash. Neuro:  Strength and sensation are intact. Psych: Normal affect.    Accessory Clinical Findings    Recent Labs: No results found for requested labs within last 365 days.   Recent Lipid Panel    Component Value Date/Time   CHOL 147 07/07/2017 0809   TRIG 93 07/07/2017 0809   HDL 65 07/07/2017 0809   CHOLHDL 2.3 07/07/2017 0809   CHOLHDL 2.7 01/05/2017 0808   VLDL 16 01/05/2017 0808   LDLCALC 63 07/07/2017 0809    ECG personally reviewed by me today-EKG Interpretation Date/Time:  Thursday May 28 2023 10:50:14  EDT Ventricular Rate:  54 PR Interval:  180 QRS Duration:  100 QT Interval:  480 QTC Calculation: 455 R Axis:   27  Text Interpretation: Sinus bradycardia When compared with ECG of 10-Mar-2017 05:11, No significant change was found Confirmed by Lawana Pray [id number]) on 05/28/2023 10:51:50 AM  EKG 05/22/2022 normal sinus rhythm no ectopy 64 bpm 05/22/2022  EKG 07/11/2021 sinus bradycardia, 56 bpm- No acute changes  Echocardiogram 01/06/2017  Mitral valve: Trace regurgitation.   Septum: Atrial septal motion is hypermobile. No Patent Foramen Ovale  present.   Left atrium: Patent foramen ovale not present.   Tricuspid valve: Mild regurgitation. The tricuspid valve regurgitation  jet is central.   Right ventricle: Normal cavity size, wall thickness and ejection  fraction.   Pulmonic valve: Trace regurgitation.   Left atrium: No spontaneous echo contrast.    Cardiac catheterization 03/09/2017 Bypass graft failure with occlusion of the SVG to the PDA and SVG to the first obtuse marginal.  Patent SVG to diagonal. Patent although atretic appearing LIMA to the mid LAD.  Normal left main. Total occlusion of the proximal to mid LAD.  90% ostial LAD obstruction. 50-60% obstruction in the ostial to proximal second obtuse marginal which is the dominant obtuse marginal previously bypassed. Severe diffusely diseased distal circumflex into the left PDA between 80 and 95% throughout the involved region. Nondominant right coronary with proximal 95% obstruction and mid 90% obstruction after the first acute marginal branch Normal left ventricular function.  Normal left ventricular end-diastolic pressure.   RECOMMENDATIONS:   Medical therapy.   Diagnostic Dominance: Left  Intervention   Assessment & Plan   1.  Coronary artery disease-denies exertional chest discomfort.  No anginal type pain.  Status post CABG x4 2018.  Underwent cardiac catheterization 2/19 which showed bypass graft  failure SVG-PDA and SVG-nondominant RCA, patent SVG-diagonal, patent LIMA-mid LAD.  Nuclear stress test at [facility] showed no significant ischemia.  Medical management was recommended.  We reviewed his EKG.  He expressed understanding.   Continue aspirin , rosuvastatin , amlodipine , clopidogrel , Imdur , metoprolol  Continue heart healthy low-sodium diet Maintain physical activity  Hypertension-BP today 108/80.  Continue metoprolol , Imdur  Maintain blood pressure log Maintain activity  Hyperlipidemia- LDL 9 on 03/10/23 Continue rosuvastatin , aspirin , Repatha High-fiber diet Maintain physical activity     Disposition: Follow-up with Dr. Swaziland or me in 12 months.  Chet Cota. Dawud Mays NP-C    05/28/2023, 11:06 AM [facility] [address] Office [phone] Fax [phone]  Notice: This dictation was prepared with Dragon dictation along with smaller phrase technology. Any transcriptional errors that result from this process are unintentional and may not be corrected upon review.  I spent 13 minutes examining this patient, reviewing medications, and using patient centered shared decision making involving her cardiac care.   I spent  20 minutes reviewing  past medical history,  medications, and prior cardiac tests.

## 2023-05-28 ENCOUNTER — Ambulatory Visit: Payer: BC Managed Care – PPO | Attending: General Practice | Admitting: General Practice

## 2023-05-28 ENCOUNTER — Encounter: Payer: Self-pay | Admitting: General Practice

## 2023-05-28 VITALS — BP 108/80 | HR 54 | Ht 71.0 in | Wt 179.8 lb

## 2023-05-28 DIAGNOSIS — Z8249 Family history of ischemic heart disease and other diseases of the circulatory system: Secondary | ICD-10-CM | POA: Diagnosis not present

## 2023-05-28 DIAGNOSIS — I2510 Atherosclerotic heart disease of native coronary artery without angina pectoris: Secondary | ICD-10-CM | POA: Diagnosis not present

## 2023-05-28 DIAGNOSIS — I10 Essential (primary) hypertension: Secondary | ICD-10-CM | POA: Diagnosis not present

## 2023-05-28 NOTE — Patient Instructions (Signed)
 Medication Instructions:  The current medical regimen is effective;  continue present plan and medications as directed. Please refer to the Current Medication list given to you today.  *If you need a refill on your cardiac medications before your next appointment, please call your pharmacy*  Lab Work: NONE  Follow-Up: At [facility], you and your health needs are our priority.  As part of our continuing mission to provide you with exceptional heart care, our providers are all part of one team.  This team includes your primary Cardiologist (physician) and Advanced Practice Providers or APPs (Physician Assistants and Nurse Practitioners) who all work together to provide you with the care you need, when you need it.  Your next appointment:   12 month(s)  Provider:   Peter Swaziland, MD or Lawana Pray, NP          We recommend signing up for the patient portal called "MyChart".  Sign up information is provided on this After Visit Summary.  MyChart is used to connect with patients for Virtual Visits (Telemedicine).  Patients are able to view lab/test results, encounter notes, upcoming appointments, etc.  Non-urgent messages can be sent to your provider as well.   To learn more about what you can do with MyChart, go to [email or web address].   Other Instructions MAINTAIN YOUR PHYSICAL ACTIVITY  DASH Eating Plan (low salt diet) DASH stands for Dietary Approaches to Stop Hypertension. The DASH eating plan is a healthy eating plan that has been shown to: Lower high blood pressure (hypertension). Reduce your risk for type 2 diabetes, heart disease, and stroke. Help with weight loss. What are tips for following this plan? Reading food labels Check food labels for the amount of salt (sodium) per serving. Choose foods with less than 5 percent of the Daily Value (DV) of sodium. In [place], foods with less than 300 milligrams (mg) of sodium per serving fit into this eating plan. To find  whole grains, look for the word "whole" as the first word in the ingredient list. Shopping Buy products labeled as "low-sodium" or "no salt added." Buy fresh foods. Avoid canned foods and pre-made or frozen meals. Cooking Try not to add salt when you cook. Use salt-free seasonings or herbs instead of table salt or sea salt. Check with your health care provider or pharmacist before using salt substitutes. Do not fry foods. Cook foods in healthy ways, such as baking, boiling, grilling, roasting, or broiling. Cook using oils that are good for your heart. These include olive, canola, avocado, soybean, and sunflower oil. Meal planning  Eat a balanced diet. This should include: 4 or more servings of fruits and 4 or more servings of vegetables each day. Try to fill half of your plate with fruits and vegetables. 6-8 servings of whole grains each day. 6 or less servings of lean meat, poultry, or fish each day. 1 oz is 1 serving. A 3 oz (85 g) serving of meat is about the same size as the palm of your hand. One egg is 1 oz (28 g). 2-3 servings of low-fat dairy each day. One serving is 1 cup (237 mL). 1 serving of nuts, seeds, or beans 5 times each week. 2-3 servings of heart-healthy fats. Healthy fats called omega-3 fatty acids are found in foods such as walnuts, flaxseeds, fortified milks, and eggs. These fats are also found in cold-water fish, such as sardines, salmon, and mackerel. Limit how much you eat of: Canned or prepackaged foods. Food that is high  in trans fat, such as fried foods. Food that is high in saturated fat, such as fatty meat. Desserts and other sweets, sugary drinks, and other foods with added sugar. Full-fat dairy products. Do not salt foods before eating. Do not eat more than 4 egg yolks a week. Try to eat at least 2 vegetarian meals a week. Eat more home-cooked food and less restaurant, buffet, and fast food. Lifestyle When eating at a restaurant, ask if your food can be  made with less salt or no salt. If you drink alcohol: Limit how much you have to: 0-1 drink a day if you are male. 0-2 drinks a day if you are male. Know how much alcohol is in your drink. In the [place] one drink is one 12 oz bottle of beer (355 mL), one 5 oz glass of wine (148 mL), or one 1 oz glass of hard liquor (44 mL). [place] information Avoid eating more than 2,300 mg of salt a day. If you have hypertension, you may need to reduce your sodium intake to 1,500 mg a day. Work with your provider to stay at a healthy body weight or lose weight. Ask what the best weight range is for you. On most days of the week, get at least 30 minutes of exercise that causes your heart to beat faster. This may include walking, swimming, or biking. Work with your provider or dietitian to adjust your eating plan to meet your specific calorie needs. What foods should I eat? Fruits All fresh, dried, or frozen fruit. Canned fruits that are in their natural juice and do not have sugar added to them. Vegetables Fresh or frozen vegetables that are raw, steamed, roasted, or grilled. Low-sodium or reduced-sodium tomato and vegetable juice. Low-sodium or reduced-sodium tomato sauce and tomato paste. Low-sodium or reduced-sodium canned vegetables. Grains Whole-grain or whole-wheat bread. Whole-grain or whole-wheat pasta. Brown rice. [place]. Bulgur. Whole-grain and low-sodium cereals. Pita bread. Low-fat, low-sodium crackers. Whole-wheat flour tortillas. Meats and other proteins Skinless chicken or [place]. Ground chicken or [place]. Pork with fat trimmed off. Fish and seafood. Egg whites. Dried beans, peas, or lentils. Unsalted nuts, nut butters, and seeds. Unsalted canned beans. Lean cuts of beef with fat trimmed off. Low-sodium, lean precooked or cured meat, such as sausages or meat loaves. Dairy Low-fat (1%) or fat-free (skim) milk. Reduced-fat, low-fat, or fat-free cheeses. Nonfat, low-sodium ricotta or  cottage cheese. Low-fat or nonfat yogurt. Low-fat, low-sodium cheese. Fats and oils Soft margarine without trans fats. Vegetable oil. Reduced-fat, low-fat, or light mayonnaise and salad dressings (reduced-sodium). Canola, safflower, olive, avocado, soybean, and sunflower oils. Avocado. Seasonings and condiments Herbs. Spices. Seasoning mixes without salt. Other foods Unsalted popcorn and pretzels. Fat-free sweets. The items listed above may not be all the foods and drinks you can have. Talk to a dietitian to learn more. What foods should I avoid? Fruits Canned fruit in a light or heavy syrup. Fried fruit. Fruit in cream or butter sauce. Vegetables Creamed or fried vegetables. Vegetables in a cheese sauce. Regular canned vegetables that are not marked as low-sodium or reduced-sodium. Regular canned tomato sauce and paste that are not marked as low-sodium or reduced-sodium. Regular tomato and vegetable juices that are not marked as low-sodium or reduced-sodium. [place]. Olives. Grains Baked goods made with fat, such as croissants, muffins, or some breads. Dry pasta or rice meal packs. Meats and other proteins Fatty cuts of meat. Ribs. Fried meat. [place]. Bologna, salami, and other precooked or cured meats, such as  sausages or meat loaves, that are not lean and low in sodium. Fat from the back of a pig (fatback). Bratwurst. Salted nuts and seeds. Canned beans with added salt. Canned or smoked fish. Whole eggs or egg yolks. Chicken or [place] with skin. Dairy Whole or 2% milk, cream, and half-and-half. Whole or full-fat cream cheese. Whole-fat or sweetened yogurt. Full-fat cheese. Nondairy creamers. Whipped toppings. Processed cheese and cheese spreads. Fats and oils Butter. Stick margarine. Lard. Shortening. Ghee. Bacon fat. Tropical oils, such as coconut, palm kernel, or palm oil. Seasonings and condiments Onion salt, garlic salt, seasoned salt, table salt, and sea salt. Worcestershire sauce.  Tartar sauce. Barbecue sauce. Teriyaki sauce. Soy sauce, including reduced-sodium soy sauce. Steak sauce. Canned and packaged gravies. Fish sauce. Oyster sauce. Cocktail sauce. Store-bought horseradish. Ketchup. Mustard. Meat flavorings and tenderizers. Bouillon cubes. Hot sauces. Pre-made or packaged marinades. Pre-made or packaged taco seasonings. Relishes. Regular salad dressings. Other foods Salted popcorn and pretzels. The items listed above may not be all the foods and drinks you should avoid. Talk to a dietitian to learn more. Where to find more information National Heart, Lung, and Blood Institute (NHLBI): BuffaloDryCleaner.gl American Heart Association (AHA): heart.org Academy of Nutrition and Dietetics: eatright.org National Kidney Foundation (NKF): kidney.org This information is not intended to replace advice given to you by your health care provider. Make sure you discuss any questions you have with your health care provider. Document Revised: 01/30/2022 Document Reviewed: 01/30/2022 Elsevier Patient Education  2024 [employer].
